# Patient Record
Sex: Female | Born: 1970
Health system: Southern US, Community
[De-identification: ages and names within clinical notes are randomized; demographics above are authoritative.]

## PROBLEM LIST (undated history)

## (undated) ENCOUNTER — Emergency Department (HOSPITAL_COMMUNITY): Admission: EM | Discharge status: Absent without leave | Payer: Medicare Other

## (undated) ENCOUNTER — Emergency Department (HOSPITAL_COMMUNITY): Payer: Medicare Other | Source: Home / Self Care

## (undated) DIAGNOSIS — G500 Trigeminal neuralgia: Secondary | ICD-10-CM

## (undated) DIAGNOSIS — F410 Panic disorder [episodic paroxysmal anxiety] without agoraphobia: Secondary | ICD-10-CM

## (undated) DIAGNOSIS — R569 Unspecified convulsions: Secondary | ICD-10-CM

## (undated) DIAGNOSIS — I639 Cerebral infarction, unspecified: Secondary | ICD-10-CM

## (undated) DIAGNOSIS — I10 Essential (primary) hypertension: Secondary | ICD-10-CM

## (undated) DIAGNOSIS — F4310 Post-traumatic stress disorder, unspecified: Secondary | ICD-10-CM

## (undated) DIAGNOSIS — F419 Anxiety disorder, unspecified: Secondary | ICD-10-CM

## (undated) DIAGNOSIS — E7800 Pure hypercholesterolemia, unspecified: Secondary | ICD-10-CM

## (undated) HISTORY — PX: ABDOMINOPLASTY: SUR9

## (undated) HISTORY — PX: BRAIN SURGERY: SHX531

## (undated) HISTORY — PX: CERVICAL ABLATION: SHX5771

## (undated) HISTORY — PX: OTHER SURGICAL HISTORY: SHX169

## (undated) HISTORY — PX: TONSILLECTOMY: SUR1361

## (undated) HISTORY — PX: ABDOMINAL HYSTERECTOMY: SHX81

## (undated) HISTORY — PX: APPENDECTOMY: SHX54

---

## 2001-05-29 ENCOUNTER — Ambulatory Visit (HOSPITAL_COMMUNITY): Admission: RE | Admit: 2001-05-29 | Discharge: 2001-05-29 | Payer: Self-pay | Admitting: Family Medicine

## 2001-05-29 ENCOUNTER — Encounter: Payer: Self-pay | Admitting: Family Medicine

## 2001-05-31 ENCOUNTER — Encounter: Payer: Self-pay | Admitting: Family Medicine

## 2001-05-31 ENCOUNTER — Ambulatory Visit (HOSPITAL_COMMUNITY): Admission: RE | Admit: 2001-05-31 | Discharge: 2001-05-31 | Payer: Self-pay | Admitting: Family Medicine

## 2001-06-05 ENCOUNTER — Ambulatory Visit (HOSPITAL_COMMUNITY): Admission: RE | Admit: 2001-06-05 | Discharge: 2001-06-05 | Payer: Self-pay | Admitting: Family Medicine

## 2001-06-05 ENCOUNTER — Encounter: Payer: Self-pay | Admitting: Family Medicine

## 2001-06-16 ENCOUNTER — Ambulatory Visit (HOSPITAL_COMMUNITY): Admission: RE | Admit: 2001-06-16 | Discharge: 2001-06-16 | Payer: Self-pay | Admitting: General Surgery

## 2001-06-16 ENCOUNTER — Encounter: Payer: Self-pay | Admitting: General Surgery

## 2001-07-07 ENCOUNTER — Ambulatory Visit (HOSPITAL_COMMUNITY): Admission: RE | Admit: 2001-07-07 | Discharge: 2001-07-07 | Payer: Self-pay | Admitting: Internal Medicine

## 2001-07-13 ENCOUNTER — Inpatient Hospital Stay (HOSPITAL_COMMUNITY): Admission: RE | Admit: 2001-07-13 | Discharge: 2001-07-14 | Payer: Self-pay | Admitting: General Surgery

## 2002-01-12 ENCOUNTER — Encounter: Payer: Self-pay | Admitting: Family Medicine

## 2002-01-12 ENCOUNTER — Ambulatory Visit (HOSPITAL_COMMUNITY): Admission: RE | Admit: 2002-01-12 | Discharge: 2002-01-12 | Payer: Self-pay | Admitting: Family Medicine

## 2002-03-13 ENCOUNTER — Ambulatory Visit (HOSPITAL_COMMUNITY): Admission: RE | Admit: 2002-03-13 | Discharge: 2002-03-13 | Payer: Self-pay | Admitting: Internal Medicine

## 2002-03-14 ENCOUNTER — Encounter (INDEPENDENT_AMBULATORY_CARE_PROVIDER_SITE_OTHER): Payer: Self-pay | Admitting: Internal Medicine

## 2002-03-14 ENCOUNTER — Ambulatory Visit (HOSPITAL_COMMUNITY): Admission: RE | Admit: 2002-03-14 | Discharge: 2002-03-14 | Payer: Self-pay | Admitting: Internal Medicine

## 2003-06-21 ENCOUNTER — Encounter: Payer: Self-pay | Admitting: Urology

## 2003-06-21 ENCOUNTER — Ambulatory Visit (HOSPITAL_COMMUNITY): Admission: RE | Admit: 2003-06-21 | Discharge: 2003-06-21 | Payer: Self-pay | Admitting: Urology

## 2003-11-12 ENCOUNTER — Ambulatory Visit (HOSPITAL_COMMUNITY): Admission: RE | Admit: 2003-11-12 | Discharge: 2003-11-12 | Payer: Self-pay | Admitting: Obstetrics & Gynecology

## 2010-01-12 ENCOUNTER — Ambulatory Visit: Payer: Self-pay | Admitting: Cardiology

## 2010-05-10 ENCOUNTER — Emergency Department (HOSPITAL_COMMUNITY): Admission: EM | Admit: 2010-05-10 | Discharge: 2010-05-11 | Payer: Self-pay | Admitting: Emergency Medicine

## 2010-11-22 ENCOUNTER — Encounter: Payer: Self-pay | Admitting: Family Medicine

## 2011-03-19 NOTE — Op Note (Signed)
[facility]  Patient:    Mariah Ibarra, Mariah Ibarra Visit Number: [id number] MRN: [id number]          Service Type: OUT Location: RAD Attending Physician:  Malissa Hippo Dictated by:   Lionel December, M.D. Proc. Date: 03/13/02 Admit Date:  03/14/2002   CC:         Patrica Duel, M.D.   Operative Report  PROCEDURE:  Esophagogastroduodenoscopy followed by flexible sigmoidoscopy.  GASTROENTEROLOGIST:  Lionel December, M.D.  INDICATION:  [name] is a 40 year old Caucasian female with abdominal pain, nausea, intermittent vomiting who also has constipation and had four episodes of rectal bleeding recently with her bowel movements.  On one occasion, she passed a large amount of fresh blood.  She had total colonoscopy and ileoscopy in September 2002 prior to he laparoscopy with adhesiolysis.  I, therefore, felt that all she needed was a flexible sigmoidoscopy to evaluate her hematochezia.  She had had multiple other studies.  She has had ultrasound, hepatobiliary scan, and there is no evidence GB disease or dysfunction.  She is undergoing diagnostic evaluation.  The patient was reviewed with the patient, and informed consent was obtained.  PREOPERATIVE MEDICATIONS:  Cetacaine spray for pharyngeal topical anesthesia, Demerol 50 mg IV, Versed 8 mg IV in divided dose.  INSTRUMENT:  Olympus video system.  FINDINGS:  Procedure performed in endoscopy suite.  The patients vital signs and O2 saturations were monitored during the procedure and remained stable.  PROCEDURE #1:  ESOPHAGOGASTRODUODENOSCOPY:  The patient was placed in the left lateral position and endoscope was passed into the oropharynx without difficulty into esophagus.  Esophagus:  Mucosa of the esophagus was normal throughout.  Squamocolumnar junction was otherwise normal.  Stomach:  It was empty and distended very well with insufflation.  Folds in the proximal stomach were normal.   Examination of mucosa at gastric body, antrum, and pyloric channel as well as angularis and fundus were normal.  Duodenum:  Duodenum as well as bulb and second part of duodenum were also normal.  The endoscope was withdrawn, and the patient was prepared for procedure #2.  #2 - FLEXIBLE SIGMOIDOSCOPY:  Rectal examination performed.  No abnormality noted on external or digital exam.  The scope was placed in the rectum and advanced to sigmoid colon.  The scope was passed through about 50 cm.  Preparation in the area examined was excellent.  Mucosa of sigmoid colon and rectum were normal.  The scope was retroflexed in the rectum to examine the anorectal junction.  Small hemorrhoids were noted below the dentate line.  The endoscope was straightened and withdrawn.  The patient tolerated the procedure well.  FINAL DIAGNOSES: 1. Normal esophagogastroduodenoscopy. 2. Normal flexible sigmoidoscopy except for small external hemorrhoids felt    to be cause of her hematochezia.  I expect we are dealing with constipation predominant irritable bowel syndrome.  RECOMMENDATIONS:  Proceed with small-bowel follow-through and start her on Zelnorm 6 mg p.o. b.i.d.  Will also start high-fiber diet and Citrucel 1 tablespoonful daily.  She will return for evaluation in four weeks from now.Dictated by:   Lionel December, M.D. Attending Physician:  Malissa Hippo DD:  03/13/02 TD:  03/14/02 Job: [id number]

## 2011-03-19 NOTE — Op Note (Signed)
NAME:  Mariah Ibarra, Mariah Ibarra              ACCOUNT NO.:  [id number]   MEDICAL RECORD NO.:  [id number]                   PATIENT TYPE:  AMB   LOCATION:  DAY                                  FACILITY:  [facility]   PHYSICIAN:  Lazaro Arms, M.D.                DATE OF BIRTH:  1971/07/14   DATE OF PROCEDURE:  11/12/2003  DATE OF DISCHARGE:  11/12/2003                                 OPERATIVE REPORT   PREOPERATIVE DIAGNOSES:  1. Dyspareunia.  2. Interstitial cystitis.  3. Vulvar vestibulitis.   POSTOPERATIVE DIAGNOSES:  1. Dyspareunia.  2. Interstitial cystitis.  3. Vulvar vestibulitis.   PROCEDURE:  Vulvar vestibulectomy.   SURGEON:  Lazaro Arms, M.D.   ANESTHESIA:  General endotracheal.   FINDINGS:  The patient is known to have interstitial cystitis where she is  undergoing treatment for the last several months in the office for that. She  also has vestibulitis which is related to the interstitial cystitis. She has  not responded to local therapy with local anesthetic jellies or the  antiinflammatory medications we have been using in the office.  As a result  of the positive findings, she is admitted for a vulvar vestibulectomy.   DESCRIPTION OF PROCEDURE:  The patient was taken to the operating room,  placed in supine position where she underwent general endotracheal  anesthesia.  She was then placed in the dorsal lithotomy position, prepped  and draped in the usual sterile fashion. A pudendal block is performed  bilaterally 10 mL bilaterally of 0.5% bupivacaine with 1:200,000  epinephrine.  The vulvar vestibule is identified, silk retaining sutures are  placed to hold the vulva back.  A 15 blade is used and just outside the  hymenal ring, a horseshoe shaped tissue is removed probably a 1/2 cm in  width down to about a centimeter in depth corresponding to the area of the  positive Q-tip test in the office.  Hemostasis is achieved with suturing and  cautery.  3-0 Vicryl  is used. There is good hemostasis.  I had to take down  the sutures a couple times on the right to ensure hemostasis but eventually  there was.  I held pressure for a little while and it had a normal amount of  spotting postoperatively. She was awakened from anesthesia, taken to the  recovery room in good stable condition.  All counts were correct.     ___________________________________________                                            Lazaro Arms, M.D.   LHE/MEDQ  D:  12/05/2003  T:  12/05/2003  Job:  [id number]

## 2011-07-25 ENCOUNTER — Emergency Department (HOSPITAL_COMMUNITY)
Admission: EM | Admit: 2011-07-25 | Discharge: 2011-07-25 | Disposition: A | Discharge status: Home / Self Care | Payer: BC Managed Care – PPO | Attending: Emergency Medicine | Admitting: Emergency Medicine

## 2011-07-25 ENCOUNTER — Other Ambulatory Visit: Payer: Self-pay

## 2011-07-25 ENCOUNTER — Emergency Department (HOSPITAL_COMMUNITY): Payer: BC Managed Care – PPO

## 2011-07-25 DIAGNOSIS — R29898 Other symptoms and signs involving the musculoskeletal system: Secondary | ICD-10-CM

## 2011-07-25 DIAGNOSIS — R209 Unspecified disturbances of skin sensation: Secondary | ICD-10-CM | POA: Insufficient documentation

## 2011-07-25 DIAGNOSIS — I10 Essential (primary) hypertension: Secondary | ICD-10-CM | POA: Insufficient documentation

## 2011-07-25 DIAGNOSIS — M542 Cervicalgia: Secondary | ICD-10-CM | POA: Insufficient documentation

## 2011-07-25 DIAGNOSIS — G500 Trigeminal neuralgia: Secondary | ICD-10-CM | POA: Insufficient documentation

## 2011-07-25 DIAGNOSIS — R51 Headache: Secondary | ICD-10-CM | POA: Insufficient documentation

## 2011-07-25 DIAGNOSIS — Z8673 Personal history of transient ischemic attack (TIA), and cerebral infarction without residual deficits: Secondary | ICD-10-CM | POA: Insufficient documentation

## 2011-07-25 DIAGNOSIS — R197 Diarrhea, unspecified: Secondary | ICD-10-CM

## 2011-07-25 DIAGNOSIS — M549 Dorsalgia, unspecified: Secondary | ICD-10-CM

## 2011-07-25 DIAGNOSIS — R112 Nausea with vomiting, unspecified: Secondary | ICD-10-CM | POA: Insufficient documentation

## 2011-07-25 DIAGNOSIS — M6281 Muscle weakness (generalized): Secondary | ICD-10-CM | POA: Insufficient documentation

## 2011-07-25 HISTORY — DX: Essential (primary) hypertension: I10

## 2011-07-25 HISTORY — DX: Cerebral infarction, unspecified: I63.9

## 2011-07-25 LAB — DIFFERENTIAL
Basophils Relative: 1 % (ref 0–1)
Eosinophils Absolute: 0.2 10*3/uL (ref 0.0–0.7)
Eosinophils Relative: 3 % (ref 0–5)
Lymphocytes Relative: 35 % (ref 12–46)
Lymphs Abs: 2.4 10*3/uL (ref 0.7–4.0)
Monocytes Absolute: 0.6 10*3/uL (ref 0.1–1.0)
Monocytes Relative: 9 % (ref 3–12)
Neutrophils Relative %: 52 % (ref 43–77)

## 2011-07-25 LAB — CBC
HCT: 41.5 % (ref 36.0–46.0)
Hemoglobin: 13.5 g/dL (ref 12.0–15.0)
MCH: 32.8 pg (ref 26.0–34.0)
MCHC: 32.5 g/dL (ref 30.0–36.0)
MCV: 101 fL — ABNORMAL HIGH (ref 78.0–100.0)
Platelets: 351 10*3/uL (ref 150–400)
RBC: 4.11 MIL/uL (ref 3.87–5.11)
RDW: 13.1 % (ref 11.5–15.5)
WBC: 6.8 10*3/uL (ref 4.0–10.5)

## 2011-07-25 LAB — COMPREHENSIVE METABOLIC PANEL
Chloride: 103 mEq/L (ref 96–112)
GFR calc Af Amer: >60 mL/min (ref >60)
Sodium: 137 mEq/L (ref 135–145)
Total Bilirubin: 0.1 mg/dL — ABNORMAL LOW (ref 0.3–1.2)
Total Protein: 7 g/dL (ref 6.0–8.3)

## 2011-07-25 LAB — PROTIME-INR
INR: 0.87 (ref 0.00–1.49)
Prothrombin Time: 12 seconds (ref 11.6–15.2)

## 2011-07-25 MED ORDER — SODIUM CHLORIDE 0.9 % IV SOLN
1000.0000 mL | INTRAVENOUS | Status: AC
  Administered 2011-07-25: 1000 mL via INTRAVENOUS

## 2011-07-25 MED ORDER — HYDROMORPHONE HCL 1 MG/ML IJ SOLN
1.0000 mg | Freq: Once | INTRAMUSCULAR | Status: AC
  Administered 2011-07-25: 1 mg via INTRAVENOUS
  Filled 2011-07-25: qty 1

## 2011-07-25 MED ORDER — METOCLOPRAMIDE HCL 5 MG/ML IJ SOLN
10.0000 mg | Freq: Once | INTRAMUSCULAR | Status: AC
  Administered 2011-07-25: 10 mg via INTRAVENOUS
  Filled 2011-07-25: qty 2

## 2011-07-25 MED ORDER — SODIUM CHLORIDE 0.9 % IV BOLUS (SEPSIS)
1000.0000 mL | Freq: Once | INTRAVENOUS | Status: AC
  Administered 2011-07-25 (×2): 1000 mL via INTRAVENOUS

## 2011-07-25 NOTE — ED Notes (Signed)
Pt states she has not been feeling well since Friday, feels weak all over, pain in right leg and hip, lower back , vomiting, states face feels flushed.   Pt reports she had an ischemic stroke February 2011.

## 2011-07-25 NOTE — ED Notes (Signed)
Pt states feeling better at this time.

## 2011-07-25 NOTE — ED Provider Notes (Signed)
History     CSN: [id number] Arrival date & time: 07/25/2011  7:58 PM  Chief Complaint  Patient presents with  . Weakness    HPI  (Consider location/radiation/quality/duration/timing/severity/associated sxs/prior treatment) This 40 year old female complains of 3 days of multiple episodes of nonbloody nausea vomiting and diarrhea with generalized weakness. She also has a gradual onset of a diffuse global headache as well as back pain and neck pain posteriorly. The back and neck pain are positional and nonradiating other than pain down her left hip. 2 days ago she noted weakness and numbness to her left leg that did not involve her left arm at all. Yesterday the weakness and numbness were much improved but not back to normal activity she has no numbness still has slight weakness to the left leg. She has not had any weakness or numbness again to her left arm. Her left face is always numb and painful from trigeminal neuralgia without weakness. She has chronic headaches and the left side of her head from trigeminal neuralgia but over the last 3 days has had a gradual onset diffuse headache. There is no sudden headache and noticed sudden strokelike symptoms. She states she gradually noticed the left leg weakness and numbness to couple of days ago as she was getting neck and back pain gradually worsening. She denies any fever confusion or change in speech, vision, swallowing, or understanding. She is no change in bowel or bladder function. She does not use IV drugs. She denies any midline neck pain or back pain. She denies any chest pain cough shortness of breath or abdominal pain. She only notices very mild pain to her left leg when she tries to walk today. It is associated with positional back pain that radiates towards her left hip without trauma. She states she has had a prior stroke that affected her speech but has recovered from that. HPI  Past Medical History  Diagnosis Date  . Stroke   .  Hypertension     Past Surgical History  Procedure Date  . Brain surgery   . Appendectomy     History reviewed. No pertinent family history.  History  Substance Use Topics  . Smoking status: Never Smoker   . Smokeless tobacco: Not on file  . Alcohol Use: Yes    OB History    Grav Para Term Preterm Abortions TAB SAB Ect Mult Living                  Review of Systems  Review of Systems  Constitutional: Negative for fever.       10 Systems reviewed and are negative for acute change except as noted in the HPI.  HENT: Positive for neck pain. Negative for rhinorrhea.   Eyes: Negative for discharge and redness.  Respiratory: Negative for cough and shortness of breath.   Cardiovascular: Negative for chest pain.  Gastrointestinal: Positive for nausea, vomiting and diarrhea. Negative for abdominal pain.  Genitourinary: Negative for dysuria.  Musculoskeletal: Positive for back pain.  Skin: Negative for rash.  Neurological: Positive for weakness, numbness and headaches. Negative for dizziness, syncope and speech difficulty.       She denies vertigo.  Psychiatric/Behavioral: Negative for suicidal ideas, hallucinations and confusion.    Allergies  Review of patient's allergies indicates no known allergies.  Home Medications   Current Outpatient Rx  Name Route Sig Dispense Refill  . ALPRAZOLAM 0.5 MG PO TABS Oral Take 0.5 mg by mouth 4 (four) times daily.      Marland Kitchen  ASPIRIN EC 81 MG PO TBEC Oral Take 81 mg by mouth daily.      . ATORVASTATIN CALCIUM 10 MG PO TABS Oral Take 10 mg by mouth daily.      Marland Kitchen BACLOFEN 10 MG PO TABS Oral Take 10 mg by mouth 3 (three) times daily.      Marland Kitchen DIAZEPAM 5 MG PO TABS Oral Take 5 mg by mouth at bedtime.      Marland Kitchen GABAPENTIN 800 MG PO TABS Oral Take 800 mg by mouth 3 (three) times daily.      Marland Kitchen HYDROCODONE-ACETAMINOPHEN 10-325 MG PO TABS Oral Take 1 tablet by mouth 3 (three) times daily.      Marland Kitchen OLMESARTAN MEDOXOMIL-HCTZ 40-12.5 MG PO TABS Oral Take 1  tablet by mouth daily.      . TRAMADOL HCL 50 MG PO TABS Oral Take 100 mg by mouth 4 (four) times daily.      Marland Kitchen ZOLPIDEM TARTRATE 5 MG PO TABS Oral Take 5 mg by mouth at bedtime.        Physical Exam    BP 108/77  Pulse 88  Temp(Src) 98 F (36.7 C) (Oral)  Resp 18  Ht 5\' 2"  (1.575 m)  Wt 140 lb (63.504 kg)  BMI 25.61 kg/m2  SpO2 100%  Physical Exam  Nursing note and vitals reviewed. Constitutional:       Awake, alert, nontoxic appearance with baseline speech for patient.  HENT:  Head: Atraumatic.  Mouth/Throat: No oropharyngeal exudate.  Eyes: EOM are normal. Pupils are equal, round, and reactive to light. Right eye exhibits no discharge. Left eye exhibits no discharge.  Neck: Normal range of motion. Neck supple.       She does not have any midline posterior neck tenderness she does have mild paracervical soft tissue tenderness bilaterally.  Cardiovascular: Normal rate and regular rhythm.   No murmur heard. Pulmonary/Chest: Effort normal and breath sounds normal. No stridor. No respiratory distress. She has no wheezes. She has no rales. She exhibits no tenderness.  Abdominal: Soft. Bowel sounds are normal. She exhibits no mass. There is no tenderness. There is no rebound.  Musculoskeletal: Normal range of motion. She exhibits no edema and no tenderness.       Baseline ROM, moves extremities with no obvious new focal weakness.  Her back has no midline tenderness she does have bilateral parathoracic and paralumbar soft tissue tenderness.  Lymphadenopathy:    She has no cervical adenopathy.  Neurological:       Awake, alert, cooperative and aware of situation; motor strength bilaterally; sensation normal to light touch bilaterally; peripheral visual fields full to confrontation; no facial asymmetry; tongue midline; major cranial nerves appear intact; no pronator drift, normal finger to nose bilaterally  Skin: No rash noted.  Psychiatric: She has a normal mood and affect.    ED  Course  Procedures (including critical care time)  The patient feels dramatically improved with ED treatment and I discussed the case with her neurologist Dr. Gerilyn Pilgrim.  Because the patient has had isolated left leg mild weakness that was gradual onset gradually worsened and now is much improved but not quite back to baseline yet with back pain and pain radiating to her left hip with transient numbness of the last couple of days it is now resolved I suspect her left leg weakness and transient numbness with gradual onset weakness in gradually improving weakness is more likely related to a lumbar radiculopathy and much less likely related to a  recurrent stroke. I doubt a subarachnoid hemorrhage, acute stroke, serious bacterial illness such as epidural abscess or bacterial meningitis, or other emergency medical condition requiring further evaluation tonight in the emergency department. I believe the patient is stable for close outpatient follow up with her neurologist and the patient understands and agrees with this assessment and plan as well.  ECG: Normal sinus rhythm with ventricular rate 97 beats per minute with normal axis normal intervals, no acute ischemic changes noted, there is no comparison ECG available at this time, impression normal ECG. Labs Reviewed  CBC - Abnormal; Notable for the following:    MCV 101.0 (*)    All other components within normal limits  COMPREHENSIVE METABOLIC PANEL - Abnormal; Notable for the following:    Alkaline Phosphatase 21 (*)    Total Bilirubin 0.1 (*)    All other components within normal limits  DIFFERENTIAL  PROTIME-INR  LAB REPORT - SCANNED   No results found.    MDM         Hurman Horn, MD 08/02/11 1120

## 2011-07-25 NOTE — ED Notes (Signed)
Pt reports onset of weakness that started Friday. Pt complains of headache at current. Pt does show some left sided facial weakness, pt states this did occur w/ previous stroke. Pt has good movement of all ext. Good strength present also.

## 2011-07-27 ENCOUNTER — Other Ambulatory Visit: Payer: Self-pay | Admitting: Neurology

## 2011-07-27 DIAGNOSIS — R531 Weakness: Secondary | ICD-10-CM

## 2011-07-27 DIAGNOSIS — R200 Anesthesia of skin: Secondary | ICD-10-CM

## 2011-07-29 ENCOUNTER — Ambulatory Visit (HOSPITAL_COMMUNITY)
Admission: RE | Admit: 2011-07-29 | Discharge: 2011-07-29 | Disposition: A | Discharge status: Home / Self Care | Payer: BC Managed Care – PPO | Source: Ambulatory Visit | Attending: Neurology | Admitting: Neurology

## 2011-07-29 ENCOUNTER — Ambulatory Visit (HOSPITAL_COMMUNITY): Payer: BC Managed Care – PPO

## 2011-07-29 ENCOUNTER — Other Ambulatory Visit (HOSPITAL_COMMUNITY): Payer: BC Managed Care – PPO

## 2011-07-29 DIAGNOSIS — M6281 Muscle weakness (generalized): Secondary | ICD-10-CM | POA: Insufficient documentation

## 2011-07-29 DIAGNOSIS — R200 Anesthesia of skin: Secondary | ICD-10-CM

## 2011-07-29 DIAGNOSIS — R209 Unspecified disturbances of skin sensation: Secondary | ICD-10-CM | POA: Insufficient documentation

## 2011-07-29 DIAGNOSIS — R531 Weakness: Secondary | ICD-10-CM

## 2012-06-06 ENCOUNTER — Emergency Department (HOSPITAL_COMMUNITY)
Admission: EM | Admit: 2012-06-06 | Discharge: 2012-06-06 | Disposition: A | Discharge status: Home / Self Care | Payer: BC Managed Care – PPO | Attending: Emergency Medicine | Admitting: Emergency Medicine

## 2012-06-06 ENCOUNTER — Encounter (HOSPITAL_COMMUNITY): Payer: Self-pay

## 2012-06-06 DIAGNOSIS — I10 Essential (primary) hypertension: Secondary | ICD-10-CM | POA: Insufficient documentation

## 2012-06-06 DIAGNOSIS — E7800 Pure hypercholesterolemia, unspecified: Secondary | ICD-10-CM | POA: Insufficient documentation

## 2012-06-06 DIAGNOSIS — Z8673 Personal history of transient ischemic attack (TIA), and cerebral infarction without residual deficits: Secondary | ICD-10-CM | POA: Insufficient documentation

## 2012-06-06 DIAGNOSIS — R51 Headache: Secondary | ICD-10-CM | POA: Insufficient documentation

## 2012-06-06 HISTORY — DX: Anxiety disorder, unspecified: F41.9

## 2012-06-06 HISTORY — DX: Pure hypercholesterolemia, unspecified: E78.00

## 2012-06-06 HISTORY — DX: Trigeminal neuralgia: G50.0

## 2012-06-06 HISTORY — DX: Panic disorder (episodic paroxysmal anxiety): F41.0

## 2012-06-06 LAB — POCT I-STAT, CHEM 8
BUN: 7 mg/dL (ref 6–23)
Calcium, Ion: 1.23 mmol/L (ref 1.12–1.23)
HCT: 40 % (ref 36.0–46.0)
Hemoglobin: 13.6 g/dL (ref 12.0–15.0)
Sodium: 140 mEq/L (ref 135–145)
TCO2: 25 mmol/L (ref 0–100)

## 2012-06-06 MED ORDER — HYDROMORPHONE HCL PF 1 MG/ML IJ SOLN
1.0000 mg | Freq: Once | INTRAMUSCULAR | Status: AC
  Administered 2012-06-06: 1 mg via INTRAVENOUS
  Filled 2012-06-06: qty 1

## 2012-06-06 MED ORDER — SODIUM CHLORIDE 0.9 % IV BOLUS (SEPSIS): 1000.0000 mL | Freq: Once | INTRAVENOUS | Status: DC

## 2012-06-06 MED ORDER — ONDANSETRON HCL 4 MG/2ML IJ SOLN
4.0000 mg | Freq: Once | INTRAMUSCULAR | Status: AC
  Administered 2012-06-06: 4 mg via INTRAVENOUS
  Filled 2012-06-06: qty 2

## 2012-06-06 NOTE — ED Notes (Signed)
Pt reports headache for 1 week, pain has now spread all over her body, nausea and vomiting (last episode Sunday)--was seen in [facility] er for same.   All symptoms have now returned. Was told that potassium was very low and needed to be admitted, pt refused to stay

## 2012-06-06 NOTE — ED Provider Notes (Addendum)
History     CSN: [id number]  Arrival date & time 06/06/12  1352   First MD Initiated Contact with Patient 06/06/12 1412      Chief Complaint  Patient presents with  . Headache    (Consider location/radiation/quality/duration/timing/severity/associated sxs/prior treatment) HPI Pt with history of chronic daily headaches attributed to trigeminal neuralgia and managed by Dr. Gerilyn Pilgrim reports over the weekend she was having severe headache nausea and vomiting. She went to [facility] ED where reportedly had blood work showing hypokalemia but did not stay for treatment. She has not vomited in the last two days, but headache has been persistent. Diffuse throbbing headache, not associated with photophobia and similar to numerous prior episodes.   Past Medical History  Diagnosis Date  . Stroke   . Hypertension   . Anxiety   . Panic attacks   . Trigeminal neuralgia   . Hypercholesteremia     Past Surgical History  Procedure Date  . Brain surgery   . Appendectomy   . Tonsillectomy     No family history on file.  History  Substance Use Topics  . Smoking status: Never Smoker   . Smokeless tobacco: Not on file  . Alcohol Use: Yes     occ    OB History    Grav Para Term Preterm Abortions TAB SAB Ect Mult Living                  Review of Systems All other systems reviewed and are negative except as noted in HPI.   Allergies  Review of patient's allergies indicates no known allergies.  Home Medications   Current Outpatient Rx  Name Route Sig Dispense Refill  . ALPRAZOLAM 0.5 MG PO TABS Oral Take 0.5 mg by mouth 4 (four) times daily.      . ASPIRIN EC 81 MG PO TBEC Oral Take 81 mg by mouth daily.      . ATORVASTATIN CALCIUM 10 MG PO TABS Oral Take 10 mg by mouth daily.      Marland Kitchen BACLOFEN 10 MG PO TABS Oral Take 10 mg by mouth 3 (three) times daily.      Marland Kitchen DIAZEPAM 5 MG PO TABS Oral Take 5 mg by mouth at bedtime.      Marland Kitchen GABAPENTIN 800 MG PO TABS Oral Take 800 mg by mouth 3  (three) times daily.      Marland Kitchen HYDROCODONE-ACETAMINOPHEN 10-325 MG PO TABS Oral Take 1 tablet by mouth 3 (three) times daily.      Marland Kitchen OLMESARTAN MEDOXOMIL-HCTZ 40-12.5 MG PO TABS Oral Take 1 tablet by mouth daily.      . TRAMADOL HCL 50 MG PO TABS Oral Take 100 mg by mouth 4 (four) times daily.      Marland Kitchen ZOLPIDEM TARTRATE 5 MG PO TABS Oral Take 5 mg by mouth at bedtime.        BP 170/100  Pulse 95  Temp 98.7 F (37.1 C) (Oral)  Resp 17  Ht 5\' 2"  (1.575 m)  Wt 160 lb (72.576 kg)  BMI 29.26 kg/m2  SpO2 100%  LMP 05/10/2012  Physical Exam  Nursing note and vitals reviewed. Constitutional: She is oriented to person, place, and time. She appears well-developed and well-nourished.  HENT:  Head: Normocephalic and atraumatic.  Eyes: EOM are normal. Pupils are equal, round, and reactive to light.  Neck: Normal range of motion. Neck supple.  Cardiovascular: Normal rate, normal heart sounds and intact distal pulses.   Pulmonary/Chest: Effort normal  and breath sounds normal.  Abdominal: Bowel sounds are normal. She exhibits no distension. There is no tenderness.  Musculoskeletal: Normal range of motion. She exhibits no edema and no tenderness.  Neurological: She is alert and oriented to person, place, and time. She has normal strength. No cranial nerve deficit or sensory deficit.  Skin: Skin is warm and dry. No rash noted.  Psychiatric: She has a normal mood and affect.    ED Course  Procedures (including critical care time)  Labs Reviewed  POCT I-STAT, CHEM 8 - Abnormal; Notable for the following:    Glucose, Bld 103 (*)     All other components within normal limits   No results found.   No diagnosis found.    MDM  Will give IVF, pain and nausea meds an check I-Stat. Pt states Dr. Ronal Fear office told her to come to the ED today.    2:58 PM Pain resolved. Labs normal. Advised to followup with Neuro as needed.     Charles B. Bernette Mayers, MD 06/06/12 1610

## 2012-07-29 ENCOUNTER — Emergency Department (HOSPITAL_COMMUNITY): Payer: BC Managed Care – PPO

## 2012-07-29 ENCOUNTER — Emergency Department (HOSPITAL_COMMUNITY)
Admission: EM | Admit: 2012-07-29 | Discharge: 2012-07-29 | Disposition: A | Discharge status: Home / Self Care | Payer: BC Managed Care – PPO | Attending: Emergency Medicine | Admitting: Emergency Medicine

## 2012-07-29 ENCOUNTER — Encounter (HOSPITAL_COMMUNITY): Payer: Self-pay

## 2012-07-29 DIAGNOSIS — Z9089 Acquired absence of other organs: Secondary | ICD-10-CM | POA: Insufficient documentation

## 2012-07-29 DIAGNOSIS — G8929 Other chronic pain: Secondary | ICD-10-CM | POA: Insufficient documentation

## 2012-07-29 DIAGNOSIS — Z79899 Other long term (current) drug therapy: Secondary | ICD-10-CM | POA: Insufficient documentation

## 2012-07-29 DIAGNOSIS — Z8673 Personal history of transient ischemic attack (TIA), and cerebral infarction without residual deficits: Secondary | ICD-10-CM | POA: Insufficient documentation

## 2012-07-29 DIAGNOSIS — E7800 Pure hypercholesterolemia, unspecified: Secondary | ICD-10-CM | POA: Insufficient documentation

## 2012-07-29 DIAGNOSIS — R51 Headache: Secondary | ICD-10-CM | POA: Insufficient documentation

## 2012-07-29 DIAGNOSIS — I10 Essential (primary) hypertension: Secondary | ICD-10-CM | POA: Insufficient documentation

## 2012-07-29 DIAGNOSIS — Z7982 Long term (current) use of aspirin: Secondary | ICD-10-CM | POA: Insufficient documentation

## 2012-07-29 LAB — CBC WITH DIFFERENTIAL/PLATELET
HCT: 39.9 % (ref 36.0–46.0)
Hemoglobin: 13.9 g/dL (ref 12.0–15.0)
Lymphocytes Relative: 17 % (ref 12–46)
MCHC: 34.8 g/dL (ref 30.0–36.0)
MCV: 98.5 fL (ref 78.0–100.0)
Monocytes Absolute: 0.8 10*3/uL (ref 0.1–1.0)
Monocytes Relative: 6 % (ref 3–12)
Neutro Abs: 9.2 10*3/uL — ABNORMAL HIGH (ref 1.7–7.7)
WBC: 12.1 10*3/uL — ABNORMAL HIGH (ref 4.0–10.5)

## 2012-07-29 LAB — COMPREHENSIVE METABOLIC PANEL
ALT: 8 U/L (ref 0–35)
Albumin: 4.1 g/dL (ref 3.5–5.2)
Calcium: 9.9 mg/dL (ref 8.4–10.5)
GFR calc Af Amer: 45 mL/min — ABNORMAL LOW (ref >90)
Glucose, Bld: 112 mg/dL — ABNORMAL HIGH (ref 70–99)
Sodium: 136 mEq/L (ref 135–145)
Total Protein: 7.6 g/dL (ref 6.0–8.3)

## 2012-07-29 MED ORDER — METHYLPREDNISOLONE SODIUM SUCC 125 MG IJ SOLR
125.0000 mg | Freq: Once | INTRAMUSCULAR | Status: AC
  Administered 2012-07-29: 125 mg via INTRAVENOUS
  Filled 2012-07-29: qty 2

## 2012-07-29 MED ORDER — SODIUM CHLORIDE 0.9 % IV SOLN
1000.0000 mL | Freq: Once | INTRAVENOUS | Status: AC
  Administered 2012-07-29: 1000 mL via INTRAVENOUS

## 2012-07-29 MED ORDER — ONDANSETRON HCL 4 MG/2ML IJ SOLN
4.0000 mg | Freq: Once | INTRAMUSCULAR | Status: AC
  Administered 2012-07-29: 4 mg via INTRAVENOUS
  Filled 2012-07-29: qty 2

## 2012-07-29 MED ORDER — METOCLOPRAMIDE HCL 5 MG/ML IJ SOLN
10.0000 mg | Freq: Once | INTRAMUSCULAR | Status: AC
  Administered 2012-07-29: 10 mg via INTRAVENOUS
  Filled 2012-07-29: qty 2

## 2012-07-29 MED ORDER — DIPHENHYDRAMINE HCL 50 MG/ML IJ SOLN
25.0000 mg | Freq: Once | INTRAMUSCULAR | Status: AC
  Administered 2012-07-29: 25 mg via INTRAVENOUS
  Filled 2012-07-29: qty 1

## 2012-07-29 MED ORDER — PROCHLORPERAZINE EDISYLATE 5 MG/ML IJ SOLN: 10.0000 mg | Freq: Once | INTRAMUSCULAR | Status: DC

## 2012-07-29 MED ORDER — SODIUM CHLORIDE 0.9 % IV SOLN
1000.0000 mL | INTRAVENOUS | Status: DC
  Administered 2012-07-29: 1000 mL via INTRAVENOUS

## 2012-07-29 NOTE — ED Notes (Signed)
In to discharge pt. Husband states pt is still having pain. EDP not giving any rx. Husband aware

## 2012-07-29 NOTE — ED Provider Notes (Addendum)
History     CSN: [id number]  Arrival date & time 07/29/12  1357   First MD Initiated Contact with Patient 07/29/12 1431      Chief Complaint  Patient presents with  . Headache    (Consider location/radiation/quality/duration/timing/severity/associated sxs/prior treatment) HPI  Patient with headache that began yesterday.  Home meds without relief.  Like prior headaches but more severe.  Nausea but no vomiting, no visual  Changes and no focal neurologic deficits.  Pain is sever, constant, nonradiating and like priors.   Past Medical History  Diagnosis Date  . Stroke   . Hypertension   . Anxiety   . Panic attacks   . Trigeminal neuralgia   . Hypercholesteremia     Past Surgical History  Procedure Date  . Brain surgery   . Appendectomy   . Tonsillectomy     No family history on file.  History  Substance Use Topics  . Smoking status: Never Smoker   . Smokeless tobacco: Not on file  . Alcohol Use: Yes     occ    OB History    Grav Para Term Preterm Abortions TAB SAB Ect Mult Living                  Review of Systems  Constitutional: Negative for fever, chills, activity change, appetite change and unexpected weight change.  HENT: Negative for sore throat, rhinorrhea, neck pain, neck stiffness and sinus pressure.   Eyes: Negative for visual disturbance.  Respiratory: Negative for cough and shortness of breath.   Cardiovascular: Negative for chest pain and leg swelling.  Gastrointestinal: Negative for vomiting, abdominal pain, diarrhea and blood in stool.  Genitourinary: Negative for dysuria, urgency, frequency, vaginal discharge and difficulty urinating.  Musculoskeletal: Negative for myalgias, arthralgias and gait problem.  Skin: Negative for color change and rash.  Neurological: Negative for weakness, light-headedness and headaches.  Hematological: Does not bruise/bleed easily.  Psychiatric/Behavioral: Negative for dysphoric mood.    Allergies  Review of  patient's allergies indicates no known allergies.  Home Medications   Current Outpatient Rx  Name Route Sig Dispense Refill  . ALPRAZOLAM 1 MG PO TABS Oral Take 1 mg by mouth every 6 (six) hours as needed. Nerves/anxiety    . ASPIRIN EC 81 MG PO TBEC Oral Take 81 mg by mouth daily.      Marland Kitchen VITAMIN B-12 PO Oral Take 1 tablet by mouth daily.    Marland Kitchen GABAPENTIN ENACARBIL ER 600 MG PO TB24 Oral Take 600 mg by mouth every 12 (twelve) hours.    . IBUPROFEN 800 MG PO TABS Oral Take 800 mg by mouth 3 (three) times daily.     Marland Kitchen LISINOPRIL-HYDROCHLOROTHIAZIDE 20-25 MG PO TABS Oral Take 1 tablet by mouth daily.    . OXYCODONE HCL ER 15 MG PO TB12 Oral Take 15 mg by mouth every 12 (twelve) hours.    Marland Kitchen TAPENTADOL HCL 75 MG PO TABS Oral Take 75 mg by mouth daily.    Jeananne Rama SULFATE 0.05-0.25 % OP SOLN Both Eyes Place 2 drops into both eyes daily as needed. Dry Eyes    . VITAMIN D (CHOLECALCIFEROL) PO Oral Take 1 tablet by mouth daily.    Marland Kitchen VITAMIN E PO Oral Take 1 tablet by mouth daily.    Marland Kitchen ZOLPIDEM TARTRATE 10 MG PO TABS Oral Take 10 mg by mouth at bedtime as needed. Sleep    . BACLOFEN 10 MG PO TABS Oral Take 10 mg by mouth  3 (three) times daily.      Marland Kitchen OXCARBAZEPINE 300 MG PO TABS Oral Take 300 mg by mouth 3 (three) times daily.    . TRAMADOL HCL 50 MG PO TABS Oral Take 100 mg by mouth 4 (four) times daily as needed. Sleep      BP 102/63  Pulse 80  Temp 98.1 F (36.7 C) (Oral)  Resp 16  Ht 5\' 2"  (1.575 m)  Wt 165 lb (74.844 kg)  BMI 30.18 kg/m2  SpO2 100%  LMP 07/27/2012  Physical Exam  Nursing note and vitals reviewed. Constitutional: She appears well-developed and well-nourished.  HENT:  Head: Normocephalic and atraumatic.  Eyes: Conjunctivae normal and EOM are normal. Pupils are equal, round, and reactive to light.  Neck: Normal range of motion. Neck supple.  Cardiovascular: Normal rate, regular rhythm, normal heart sounds and intact distal pulses.   Pulmonary/Chest:  Effort normal and breath sounds normal.  Abdominal: Soft. Bowel sounds are normal.  Musculoskeletal: Normal range of motion.  Neurological: She is alert.  Skin: Skin is warm and dry.  Psychiatric: She has a normal mood and affect. Judgment and thought content normal. Her speech is delayed. Cognition and memory are normal.    ED Course  Procedures (including critical care time)  Labs Reviewed  COMPREHENSIVE METABOLIC PANEL - Abnormal; Notable for the following:    Glucose, Bld 112 (*)     Creatinine, Ser 1.61 (*)     Alkaline Phosphatase 26 (*)     Total Bilirubin 0.1 (*)     GFR calc non Af Amer 39 (*)     GFR calc Af Amer 45 (*)     All other components within normal limits  CBC WITH DIFFERENTIAL - Abnormal; Notable for the following:    WBC 12.1 (*)     MCH 34.3 (*)     Neutro Abs 9.2 (*)     All other components within normal limits  TROPONIN I   Ct Head Wo Contrast  07/29/2012  *RADIOLOGY REPORT*  Clinical Data: Headache and slurred speech.  CT HEAD WITHOUT CONTRAST  Technique:  Contiguous axial images were obtained from the base of the skull through the vertex without contrast.  Comparison: Head CT scan 07/25/2011 and brain MRI 07/29/2011.  Findings: Left occipital craniectomy defect with plate and screws in place is again seen.  The brain appears normal without evidence of infarction, hemorrhage, mass lesion, mass effect, midline shift or abnormal extra-axial fluid collection.  No hydrocephalus or pneumocephalus.  IMPRESSION: No acute finding.  Postoperative change left occipital bone noted as on prior studies.   Original Report Authenticated By: Bernadene Bell. Maricela Curet, M.D.      No diagnosis found.    MDM  Patient with history of chronic headaches and states she has had stroke but no definitive diagnostics found and patient states she was not admitted for it.  No acute chagnes seen on head ct and no acute ct changes seen.  Patient without evidence of bleed for source of  headache and feels improved after medications here.  Advised to continue home meds for same and follow up with neurologist.         Hilario Quarry, MD 07/29/12 1655  Hilario Quarry, MD 07/29/12 [phone]

## 2012-07-29 NOTE — ED Notes (Signed)
Complain of severe headache that started yesterday

## 2012-07-29 NOTE — ED Notes (Signed)
Pt has non prod cough. Husband answering all question for pt. Pt alert and oriented

## 2012-07-29 NOTE — ED Notes (Signed)
Patient c/o of severe headache that has been going on for about two days not getting progressively worse today. Patient also c/o chest pain and nausea. Patient states that she is having "cold sweats".   Her husband stated that her speech was slurred and she was having trouble organizing her thoughts.   Pt. Has a history of trigeminal neuralgia and seizures. EKG ordered and patient is on monitor.

## 2012-08-24 ENCOUNTER — Emergency Department (HOSPITAL_COMMUNITY): Payer: BC Managed Care – PPO

## 2012-08-24 ENCOUNTER — Encounter (HOSPITAL_COMMUNITY): Payer: Self-pay | Admitting: *Deleted

## 2012-08-24 ENCOUNTER — Inpatient Hospital Stay (HOSPITAL_COMMUNITY)
Admission: EM | Admit: 2012-08-24 | Discharge: 2012-08-26 | DRG: 450 | Disposition: A | Discharge status: Home / Self Care | Payer: BC Managed Care – PPO | Attending: Internal Medicine | Admitting: Internal Medicine

## 2012-08-24 DIAGNOSIS — R001 Bradycardia, unspecified: Secondary | ICD-10-CM

## 2012-08-24 DIAGNOSIS — G8190 Hemiplegia, unspecified affecting unspecified side: Secondary | ICD-10-CM | POA: Diagnosis present

## 2012-08-24 DIAGNOSIS — R279 Unspecified lack of coordination: Secondary | ICD-10-CM | POA: Diagnosis present

## 2012-08-24 DIAGNOSIS — R51 Headache: Secondary | ICD-10-CM | POA: Diagnosis present

## 2012-08-24 DIAGNOSIS — I498 Other specified cardiac arrhythmias: Secondary | ICD-10-CM | POA: Diagnosis present

## 2012-08-24 DIAGNOSIS — Y92009 Unspecified place in unspecified non-institutional (private) residence as the place of occurrence of the external cause: Secondary | ICD-10-CM

## 2012-08-24 DIAGNOSIS — R270 Ataxia, unspecified: Secondary | ICD-10-CM

## 2012-08-24 DIAGNOSIS — R4182 Altered mental status, unspecified: Secondary | ICD-10-CM | POA: Diagnosis present

## 2012-08-24 DIAGNOSIS — Z79899 Other long term (current) drug therapy: Secondary | ICD-10-CM

## 2012-08-24 DIAGNOSIS — T50901A Poisoning by unspecified drugs, medicaments and biological substances, accidental (unintentional), initial encounter: Secondary | ICD-10-CM

## 2012-08-24 DIAGNOSIS — Z8673 Personal history of transient ischemic attack (TIA), and cerebral infarction without residual deficits: Secondary | ICD-10-CM

## 2012-08-24 DIAGNOSIS — E785 Hyperlipidemia, unspecified: Secondary | ICD-10-CM

## 2012-08-24 DIAGNOSIS — T426X1A Poisoning by other antiepileptic and sedative-hypnotic drugs, accidental (unintentional), initial encounter: Principal | ICD-10-CM | POA: Diagnosis present

## 2012-08-24 DIAGNOSIS — R4701 Aphasia: Secondary | ICD-10-CM | POA: Diagnosis present

## 2012-08-24 DIAGNOSIS — Z9089 Acquired absence of other organs: Secondary | ICD-10-CM

## 2012-08-24 DIAGNOSIS — F410 Panic disorder [episodic paroxysmal anxiety] without agoraphobia: Secondary | ICD-10-CM | POA: Diagnosis present

## 2012-08-24 DIAGNOSIS — I10 Essential (primary) hypertension: Secondary | ICD-10-CM | POA: Diagnosis present

## 2012-08-24 DIAGNOSIS — Z8249 Family history of ischemic heart disease and other diseases of the circulatory system: Secondary | ICD-10-CM

## 2012-08-24 DIAGNOSIS — G500 Trigeminal neuralgia: Secondary | ICD-10-CM | POA: Diagnosis present

## 2012-08-24 DIAGNOSIS — Z7982 Long term (current) use of aspirin: Secondary | ICD-10-CM

## 2012-08-24 LAB — COMPREHENSIVE METABOLIC PANEL
AST: 22 U/L (ref 0–37)
Albumin: 3.5 g/dL (ref 3.5–5.2)
Alkaline Phosphatase: 27 U/L — ABNORMAL LOW (ref 39–117)
BUN: 4 mg/dL — ABNORMAL LOW (ref 6–23)
Creatinine, Ser: 0.84 mg/dL (ref 0.50–1.10)
Potassium: 3.5 mEq/L (ref 3.5–5.1)
Total Protein: 7 g/dL (ref 6.0–8.3)

## 2012-08-24 LAB — URINE MICROSCOPIC-ADD ON

## 2012-08-24 LAB — CBC
HCT: 37.1 % (ref 36.0–46.0)
MCHC: 34.2 g/dL (ref 30.0–36.0)
Platelets: 349 10*3/uL (ref 150–400)
RDW: 11.6 % (ref 11.5–15.5)

## 2012-08-24 LAB — RAPID URINE DRUG SCREEN, HOSP PERFORMED
Amphetamines: NOT DETECTED
Opiates: NOT DETECTED
Tetrahydrocannabinol: NOT DETECTED

## 2012-08-24 LAB — URINALYSIS, ROUTINE W REFLEX MICROSCOPIC
Leukocytes, UA: NEGATIVE
Protein, ur: NEGATIVE mg/dL
Urobilinogen, UA: 0.2 mg/dL (ref 0.0–1.0)

## 2012-08-24 LAB — GLUCOSE, CAPILLARY: Glucose-Capillary: 95 mg/dL (ref 70–99)

## 2012-08-24 LAB — SALICYLATE LEVEL: Salicylate Lvl: <2 mg/dL — ABNORMAL LOW (ref 2.8–20.0)

## 2012-08-24 LAB — PREGNANCY, URINE: Preg Test, Ur: NEGATIVE

## 2012-08-24 LAB — ACETAMINOPHEN LEVEL: Acetaminophen (Tylenol), Serum: <15 ug/mL (ref 10–30)

## 2012-08-24 MED ORDER — BISACODYL 10 MG RE SUPP: 10.0000 mg | Freq: Every day | RECTAL | Status: DC | PRN

## 2012-08-24 MED ORDER — ALUM & MAG HYDROXIDE-SIMETH 200-200-20 MG/5ML PO SUSP: 30.0000 mL | Freq: Four times a day (QID) | ORAL | Status: DC | PRN

## 2012-08-24 MED ORDER — LORAZEPAM 2 MG/ML IJ SOLN
0.5000 mg | Freq: Once | INTRAMUSCULAR | Status: AC
  Administered 2012-08-24: 0.5 mg via INTRAVENOUS
  Filled 2012-08-24: qty 1

## 2012-08-24 MED ORDER — HYDROMORPHONE HCL PF 1 MG/ML IJ SOLN
INTRAMUSCULAR | Status: AC
  Administered 2012-08-24: 0.5 mg via INTRAVENOUS
  Filled 2012-08-24: qty 1

## 2012-08-24 MED ORDER — LORAZEPAM 2 MG/ML IJ SOLN
0.5000 mg | INTRAMUSCULAR | Status: DC | PRN
  Administered 2012-08-25 – 2012-08-26 (×3): 0.5 mg via INTRAVENOUS
  Filled 2012-08-24 (×4): qty 1

## 2012-08-24 MED ORDER — ACETAMINOPHEN 650 MG RE SUPP
650.0000 mg | Freq: Once | RECTAL | Status: AC
  Administered 2012-08-24: 650 mg via RECTAL
  Filled 2012-08-24: qty 1

## 2012-08-24 MED ORDER — SODIUM CHLORIDE 0.9 % IV SOLN
INTRAVENOUS | Status: AC
  Administered 2012-08-24 – 2012-08-25 (×3): via INTRAVENOUS

## 2012-08-24 MED ORDER — SODIUM CHLORIDE 0.9 % IJ SOLN
3.0000 mL | Freq: Two times a day (BID) | INTRAMUSCULAR | Status: DC
  Administered 2012-08-24 – 2012-08-25 (×2): 3 mL via INTRAVENOUS
  Filled 2012-08-24: qty 3

## 2012-08-24 MED ORDER — HYDROMORPHONE HCL PF 1 MG/ML IJ SOLN
1.0000 mg | Freq: Once | INTRAMUSCULAR | Status: AC
  Administered 2012-08-24: 1 mg via INTRAVENOUS
  Filled 2012-08-24: qty 1

## 2012-08-24 MED ORDER — HYDROMORPHONE HCL PF 1 MG/ML IJ SOLN
0.5000 mg | Freq: Once | INTRAMUSCULAR | Status: AC
  Administered 2012-08-24: 0.5 mg via INTRAVENOUS

## 2012-08-24 MED ORDER — LORAZEPAM 2 MG/ML IJ SOLN
INTRAMUSCULAR | Status: AC
  Administered 2012-08-24: 2 mg
  Filled 2012-08-24: qty 1

## 2012-08-24 MED ORDER — ONDANSETRON HCL 4 MG PO TABS: 4.0000 mg | ORAL_TABLET | Freq: Four times a day (QID) | ORAL | Status: DC | PRN

## 2012-08-24 MED ORDER — LORAZEPAM 2 MG/ML IJ SOLN: 2.0000 mg | Freq: Once | INTRAMUSCULAR | Status: DC

## 2012-08-24 MED ORDER — HYDROMORPHONE HCL PF 1 MG/ML IJ SOLN
0.5000 mg | INTRAMUSCULAR | Status: DC | PRN
  Administered 2012-08-25 – 2012-08-26 (×11): 0.5 mg via INTRAVENOUS
  Filled 2012-08-24 (×10): qty 1
  Filled 2012-08-24: qty 2
  Filled 2012-08-24 (×2): qty 1

## 2012-08-24 MED ORDER — ONDANSETRON HCL 4 MG/2ML IJ SOLN: 4.0000 mg | Freq: Four times a day (QID) | INTRAMUSCULAR | Status: DC | PRN

## 2012-08-24 NOTE — ED Provider Notes (Signed)
History     CSN: [id number]  Arrival date & time 08/24/12  1615   First MD Initiated Contact with Patient 08/24/12 1633      Chief Complaint  Patient presents with  . Altered Mental Status     The history is provided by the spouse and medical records. History Limited By: Level V caveat: Altered mental status.   patient is a long-standing history of trigeminal neuralgia.  She has had surgery approximately 4-5 years ago which did not relieve her symptoms.  [facility] was [facility] one week ago for a gamma knife procedure by Dr. Johny Drilling.  The family called Dr. Johny Drilling today because of new altered mental status and confusion.  Patient has no psychiatric history.  She's been on her current medications for a long-standing period time.  Additional history was obtained from the grandmother who is present with the patient all day.  The husband states that he saw the patient and she appeared to be normal at 6:30 when he left the house.  The grandparents came over and around 10 and had breakfast with the patient who at that time seemed normal addendum 10-15 minutes later after going to her bedroom for 15 minutes she then came back and was complaining of her legs itching and burning and then began pacing back and forth from the couch to the bedroom.  At one point she brought out some close in a basket and slammed on the counter.  She was not making much sense to the family.  She tried to leave the house several times but got the wrong keys to the car.  Patient does not abuse drugs.  She has no psychiatric history.  She's not reported to drink alcohol.  Her husband came home in the afternoon and noted her to be very confused and noticed that her pupils are very dilated.  He does not know of any prior suicide attempts and does not believe this was an intentional overdose.  He is unsure what medications are in the house.  He is unsure if there is bedroom house.  He does report there is Nyquil in the  house.   Past Medical History  Diagnosis Date  . Stroke   . Hypertension   . Anxiety   . Panic attacks   . Trigeminal neuralgia   . Hypercholesteremia     Past Surgical History  Procedure Date  . Brain surgery   . Appendectomy   . Tonsillectomy     No family history on file.  History  Substance Use Topics  . Smoking status: Never Smoker   . Smokeless tobacco: Not on file  . Alcohol Use: Yes     occ    OB History    Grav Para Term Preterm Abortions TAB SAB Ect Mult Living                  Review of Systems  Unable to perform ROS   Allergies  Review of patient's allergies indicates no known allergies.  Home Medications   Current Outpatient Rx  Name Route Sig Dispense Refill  . ALPRAZOLAM 1 MG PO TABS Oral Take 1 mg by mouth every 6 (six) hours as needed. Nerves/anxiety    . ASPIRIN EC 81 MG PO TBEC Oral Take 81 mg by mouth daily.      Marland Kitchen BACLOFEN 10 MG PO TABS Oral Take 10 mg by mouth 3 (three) times daily.      Marland Kitchen  VITAMIN B-12 PO Oral Take 1 tablet by mouth daily.    Marland Kitchen GABAPENTIN ENACARBIL ER 600 MG PO TB24 Oral Take 600 mg by mouth every 12 (twelve) hours.    . IBUPROFEN 800 MG PO TABS Oral Take 800 mg by mouth 3 (three) times daily.     Marland Kitchen LISINOPRIL-HYDROCHLOROTHIAZIDE 20-25 MG PO TABS Oral Take 1 tablet by mouth daily.    Marland Kitchen OXCARBAZEPINE 300 MG PO TABS Oral Take 300 mg by mouth 3 (three) times daily.    . OXYCODONE HCL ER 15 MG PO TB12 Oral Take 15 mg by mouth every 12 (twelve) hours.    Marland Kitchen TAPENTADOL HCL 75 MG PO TABS Oral Take 75 mg by mouth daily.    Jeananne Rama SULFATE 0.05-0.25 % OP SOLN Both Eyes Place 2 drops into both eyes daily as needed. Dry Eyes    . TRAMADOL HCL 50 MG PO TABS Oral Take 100 mg by mouth 4 (four) times daily as needed. Sleep    . VITAMIN D (CHOLECALCIFEROL) PO Oral Take 1 tablet by mouth daily.    Marland Kitchen VITAMIN E PO Oral Take 1 tablet by mouth daily.    Marland Kitchen ZOLPIDEM TARTRATE 10 MG PO TABS Oral Take 10 mg by mouth at bedtime as  needed. Sleep      BP 134/101  Pulse 87  Temp 99.3 F (37.4 C) (Oral)  Resp 18  SpO2 96%  LMP 07/27/2012  Physical Exam  Nursing note and vitals reviewed. Constitutional: She appears well-developed and well-nourished. No distress.  HENT:  Head: Normocephalic and atraumatic.  Eyes: EOM are normal.       Pupils 5-6 mm bilaterally  Neck: Normal range of motion.  Cardiovascular: Normal rate, regular rhythm and normal heart sounds.   Pulmonary/Chest: Effort normal and breath sounds normal.  Abdominal: Soft. She exhibits no distension. There is no tenderness.  Musculoskeletal: Normal range of motion.  Neurological:       States "oh God oh God"when rectal temperature being performed.  Moves all extremities.  Does not follow commands.  Occasionally shakes her head yes and no to certain simple statements.  No facial asymmetry  Skin: Skin is warm and dry.  Psychiatric: She has a normal mood and affect. Judgment normal.    ED Course  Procedures (including critical care time)  Labs Reviewed  CBC - Abnormal; Notable for the following:    RBC 3.77 (*)     All other components within normal limits  COMPREHENSIVE METABOLIC PANEL - Abnormal; Notable for the following:    BUN 4 (*)     Alkaline Phosphatase 27 (*)     Total Bilirubin 0.1 (*)     GFR calc non Af Amer 85 (*)     All other components within normal limits  SALICYLATE LEVEL - Abnormal; Notable for the following:    Salicylate Lvl <2.0 (*)     All other components within normal limits  URINALYSIS, ROUTINE W REFLEX MICROSCOPIC - Abnormal; Notable for the following:    Hgb urine dipstick TRACE (*)     All other components within normal limits  URINE MICROSCOPIC-ADD ON - Abnormal; Notable for the following:    Squamous Epithelial / LPF FEW (*)     All other components within normal limits  ACETAMINOPHEN LEVEL  ETHANOL  PREGNANCY, URINE  GLUCOSE, CAPILLARY  CULTURE, BLOOD (ROUTINE X 2)  CULTURE, BLOOD (ROUTINE X 2)      Date: 08/24/2012  Rate: 89  Rhythm: normal  sinus rhythm  QRS Axis: normal  Intervals: normal  ST/T Wave abnormalities: normal  Conduction Disutrbances: none  Narrative Interpretation:   Old EKG Reviewed: No significant changes noted    Ct Head Wo Contrast  08/24/2012  *RADIOLOGY REPORT*  Clinical Data: Slurred speech and altered mental status.  CT HEAD WITHOUT CONTRAST  Technique:  Contiguous axial images were obtained from the base of the skull through the vertex without contrast.  Comparison: CT head without contrast 07/29/2012.  MRI brain 07/29/2011.  Findings: The patient is status post left occipital craniotomy. Focal encephalomalacia of the left cerebellum is stable.  No acute cortical infarct, hemorrhage, or mass lesion is present.  The ventricles are normal size.  No significant extra-axial fluid collection is present.  The paranasal sinuses and mastoid air cells are clear.  The osseous skull is intact otherwise.  IMPRESSION:  1.  Stable postoperative changes of the left occiput and posterior fossa. 2.  No acute intracranial abnormality or significant interval change.   Original Report Authenticated By: Jamesetta Orleans. MATTERN, M.D.     I personally reviewed the imaging tests through PACS system  I reviewed available ER/hospitalization records thought the EMR    1.  Altered mental status  2.  Apparent anticholinergic toxicity   MDM  This appears to be anticholinergic toxicity.  At this time I will hold physostigmine.  Patient's been monitored throughout her ER stay and does not appear to be getting worse at this time.  She does have acute delirium.  She is flushed red any other mucous membranes are dry.  This may be secondary to dextromethorphan and/or some other drug in house.  Patient will need to be admitted for observation.        Lyanne Co, MD 08/24/12 [phone]

## 2012-08-24 NOTE — ED Notes (Signed)
Patient continues to cry out w/minimal stimulation.

## 2012-08-24 NOTE — ED Notes (Signed)
Patient remains intermittently restless and compulsive.  Does not respond to commands and only repeats "Mommy, Mommy, Mommy".  Mother at bedside.

## 2012-08-24 NOTE — ED Notes (Signed)
According to spouse, patient's parents reported she has been confused and "acting like a caged animal" all day.  Speech is slurred at times and she does not respond verbally to questions.  She is crying constantly which spouse says just started.  Pupils 6/6, reactive.  Spouse states she is on  A lot of pain meds and may have taken too much.

## 2012-08-24 NOTE — ED Notes (Signed)
Report called to Tracey, RN on unit 300. 

## 2012-08-24 NOTE — H&P (Signed)
[facility] History and Physical  Atiyana Welte [facility] ZOX:[id number] DOB: 08-Apr-1971 DOA: 08/24/2012  Referring physician: Domenick Gong PCP: Beryle Beams, MD  Specialists: Dr. Johny Drilling at [facility], Neurosurgery  Chief Complaint: Altered Mental Status  HPI: Mariah Ibarra is a 41 y.o. female with a complex medical history related to many years of severe trigeminal neuralgia s/p craniotomy and partial nerve resection 4-5 years ago and recent  gamma knife radiation 1 week ago at [facility]  presented to the [facility] with acute onset ataxia, dilated pupils, confusion, slurred speech, and agitation. Symptoms started around 10 AM this morning, she was in her  normal state when her husband left for work at 6:30AM. Her mother came over to check on her around 33 AM, they had breakfast and patient went back to her bedroom to rest, but 15 minutes later came out complaining that her legs were burning and she began pacing and becoming agiated which gradulaly worsened throughout the day, when her husband got home at around 4PM he called Dr. Johny Drilling at [facility] who did the gamma knife-he advised them to go to ED for urgent evaluation that this was not a side effect from her procedure.  The patient has no psychiatric history, no suicidal ideation or affective disorders. There have been no recent changes in her medications, no illicit drug or alcohol use. I requested that her husband bring all of her pill bottles from home and any OTC meds that may have been taken- all pill counts were pretty much correct except there was no bottle of Trileptal which was filled at the pharmacy several days ago, her husband does not know where this bottle is located but will check when he returns home. She is on multiple drugs for neuropathic pain including baclofen, tripletal, alprazolam, gabapentin, ambien, nucynta, tramadol, oxycodone and also receives injections by Dr. Judithann Sheen.  Review of Systems: unable to obtain due to patient altered mental  status.  Past Medical History  Diagnosis Date  . Stroke   . Hypertension   . Anxiety   . Panic attacks   . Trigeminal neuralgia   . Hypercholesteremia    Past Surgical History  Procedure Date  . Brain surgery   . Appendectomy   . Tonsillectomy    Social History:  reports that she has never smoked. She has never used smokeless tobacco. She reports that she drinks alcohol. She reports that she does not use illicit drugs. Lives at home with her husband. Independent in ADLs.  No Known Allergies  History reviewed. No pertinent family history.  Family history significant for Hypertension, CAD. No known history of malignancies.  Prior to Admission medications   Medication Sig Start Date End Date Taking? Authorizing Provider  ALPRAZolam Prudy Feeler) 1 MG tablet Take 1 mg by mouth every 6 (six) hours as needed. Nerves/anxiety   Yes Historical Provider, MD  aspirin EC 81 MG tablet Take 81 mg by mouth daily.     Yes Historical Provider, MD  baclofen (LIORESAL) 10 MG tablet Take 10 mg by mouth 3 (three) times daily.     Yes Historical Provider, MD  Cyanocobalamin (VITAMIN B-12 PO) Take 1 tablet by mouth daily.   Yes Historical Provider, MD  Gabapentin Enacarbil (HORIZANT) 600 MG TB24 Take 600 mg by mouth every 12 (twelve) hours.   Yes Historical Provider, MD  ibuprofen (ADVIL,MOTRIN) 800 MG tablet Take 800 mg by mouth 3 (three) times daily.    Yes Historical Provider, MD  lisinopril-hydrochlorothiazide (PRINZIDE,ZESTORETIC) 20-25 MG per tablet  Take 1 tablet by mouth daily.   Yes Historical Provider, MD  Oxcarbazepine (TRILEPTAL) 300 MG tablet Take 300 mg by mouth 3 (three) times daily.   Yes Historical Provider, MD  oxyCODONE (OXYCONTIN) 15 MG TB12 Take 15 mg by mouth every 12 (twelve) hours.   Yes Historical Provider, MD  Tapentadol HCl (NUCYNTA) 75 MG TABS Take 75 mg by mouth daily.   Yes Historical Provider, MD  tetrahydrozoline-zinc (VISINE-AC) 0.05-0.25 % ophthalmic solution Place 2 drops  into both eyes daily as needed. Dry Eyes   Yes Historical Provider, MD  traMADol (ULTRAM) 50 MG tablet Take 100 mg by mouth 4 (four) times daily as needed. Sleep   Yes Historical Provider, MD  VITAMIN D, CHOLECALCIFEROL, PO Take 1 tablet by mouth daily.   Yes Historical Provider, MD  VITAMIN E PO Take 1 tablet by mouth daily.   Yes Historical Provider, MD  zolpidem (AMBIEN) 10 MG tablet Take 10 mg by mouth at bedtime as needed. Sleep   Yes Historical Provider, MD   Physical Exam: Filed Vitals:   08/24/12 1905 08/24/12 1930 08/24/12 2106 08/24/12 2129  BP: 124/94  151/98 148/92  Pulse:  92  72  Temp:    97.5 F (36.4 C)  TempSrc:    Oral  Resp:    20  SpO2:  97% 98% 100%     General:  Agitated, Stuporous, Does not follow commands, appears severely intoxicated-sighing and moaning  Eyes:  VERY LARGE DILATED PUPILS, minimally reactive to light, significant proptosis  ENT: grimaces and guards left side of face, winces to exam, no lacrimation, cheek on left appears to be swollen with small scab over cheek.  Neck: supple, no adenopathy  Cardiovascular: RRR  Respiratory: CTAB  Abdomen: S, NT, no HSM  Skin: no rashes, no lesions  Musculoskeletal: moving all 4 extremities spontaneously, was able to get OOB for full assist and use BSC.   Psychiatric: unable to assess, disoriented state  Neurologic: stupor, reflexes brisk, no gaze deviation, ataxic shuffling gait  Labs on Admission:  Basic Metabolic Panel:  Lab 08/24/12 1914  NA 143  K 3.5  CL 107  CO2 26  GLUCOSE 93  BUN 4*  CREATININE 0.84  CALCIUM 9.1  MG --  PHOS --   Liver Function Tests:  Lab 08/24/12 1644  AST 22  ALT 29  ALKPHOS 27*  BILITOT 0.1*  PROT 7.0  ALBUMIN 3.5   No results found for this basename: LIPASE:5,AMYLASE:5 in the last 168 hours No results found for this basename: AMMONIA:5 in the last 168 hours CBC:  Lab 08/24/12 1644  WBC 8.2  NEUTROABS --  HGB 12.7  HCT 37.1  MCV 98.4  PLT  349   Cardiac Enzymes: No results found for this basename: CKTOTAL:5,CKMB:5,CKMBINDEX:5,TROPONINI:5 in the last 168 hours  BNP (last 3 results) No results found for this basename: PROBNP:3 in the last 8760 hours CBG:  Lab 08/24/12 1651  GLUCAP 95    Radiological Exams on Admission: Ct Head Wo Contrast  08/24/2012  *RADIOLOGY REPORT*  Clinical Data: Slurred speech and altered mental status.  CT HEAD WITHOUT CONTRAST  Technique:  Contiguous axial images were obtained from the base of the skull through the vertex without contrast.  Comparison: CT head without contrast 07/29/2012.  MRI brain 07/29/2011.  Findings: The patient is status post left occipital craniotomy. Focal encephalomalacia of the left cerebellum is stable.  No acute cortical infarct, hemorrhage, or mass lesion is present.  The ventricles  are normal size.  No significant extra-axial fluid collection is present.  The paranasal sinuses and mastoid air cells are clear.  The osseous skull is intact otherwise.  IMPRESSION:  1.  Stable postoperative changes of the left occiput and posterior fossa. 2.  No acute intracranial abnormality or significant interval change.   Original Report Authenticated By: Jamesetta Orleans. MATTERN, M.D.     EKG: Independently reviewed. ALL INTERVALS ARE NORMAL, NSR.  PROBLEM LIST: Principal Problem:  *Altered mental status Active Problems:  Overdose, unintentional  Ataxia  Trigeminal neuralgia  Hypertension  Bradycardia  Hyperlipidemia  ASSESSMENT: This is an unfortunate 41 yo woman who has severe trigeminal neuralgia who has come in with an acute change in her mental status, she has no psych history and has never had any similar episodes, according to husband and confirmed by Dr. Johny Drilling that she is meticulous with her meds and this a dramatic change in her baseline mental and cognitive sate. She came in ataxic, with large dilated pupils, bradycardic to 41, hypertensive, low grade temp, stuporous, and  agitated but has all normal lab work including negative tox screens. Pill counts for available bottles were correct, except the trileptal bottle is missing and very well may be the culprit given her presentation. I have discussed the details of this case with the [facility] Poison Control toxicologist -possible anticholinergic OD vs. Baclofen, Seratonin syndrome or carbamazapine-doubtful that this is from opiate or benzo or a withdrawal syndrome given the presentation. This could also be a severe atypical reaction to gamma knife radiation or atypical seizure. Nothing to suggest infection or metabolic derangements. She has stable vital signs currently and is maintaining her airway. CT scan of her head showed nothing acute.  PLAN:  1. Main goal is supportive care for possible unintentional OD on her neuropathic pain/anticonvulsant /muscle relaxant meds or possible OTC med OD. IV fluids at 200/hr, sitter at bedside, frequent neuro checks q2, seizure precautions, monitor on tele.  2. I have held all of her oral medications given unknown etiology of toxicity but provided dilaudid for opiate pain control and lorazepam IV for her severe agitation and for seizure prophylaxis (confirmed with toxicologist that this would be in order given her clinical agitation and presentation).   3. Monitor closely for next 24 hours, if she improves then it was likely drug toxicity, if she continunes to be altered consider transfer to [facility] to her neurosurgeons service.   4. Work up pending - I added full drugs of abuse screen (full panel not just in house lab), oxcarbamazapine level, and also ordered an MRI of her Brain-she made need EEG and neuro evaluation.  5. Will repeat a 12 lead EKG in am to check her intervals.  Code Status: Full Code Family Communication: Plan of care discussed in detail with her husband and mother who were at the bedside. Disposition Plan: Admitted as observation for supportive care from possible  overdose/toxicity, if she does not improve in 24 hours, I would recommend having her transferred to [facility] to Dr. Ruthe Mannan service for management.  Time spent: 70 minutes  [facility] Pager [phone]  If 7PM-7AM, please contact night-coverage [email or web address] Password [email or web address] 08/24/2012, 10:17 PM

## 2012-08-25 ENCOUNTER — Observation Stay (HOSPITAL_COMMUNITY): Payer: BC Managed Care – PPO

## 2012-08-25 ENCOUNTER — Observation Stay (HOSPITAL_COMMUNITY)
Admit: 2012-08-25 | Discharge: 2012-08-25 | Disposition: A | Discharge status: Home / Self Care | Payer: BC Managed Care – PPO | Attending: Neurology | Admitting: Neurology

## 2012-08-25 DIAGNOSIS — G500 Trigeminal neuralgia: Secondary | ICD-10-CM

## 2012-08-25 DIAGNOSIS — I10 Essential (primary) hypertension: Secondary | ICD-10-CM

## 2012-08-25 LAB — COMPREHENSIVE METABOLIC PANEL
ALT: 18 U/L (ref 0–35)
Calcium: 8.6 mg/dL (ref 8.4–10.5)
GFR calc Af Amer: >90 mL/min (ref >90)
Glucose, Bld: 80 mg/dL (ref 70–99)
Sodium: 144 mEq/L (ref 135–145)
Total Protein: 5.9 g/dL — ABNORMAL LOW (ref 6.0–8.3)

## 2012-08-25 LAB — CBC
HCT: 36.1 % (ref 36.0–46.0)
Hemoglobin: 12.3 g/dL (ref 12.0–15.0)
MCHC: 34.1 g/dL (ref 30.0–36.0)
RBC: 3.67 MIL/uL — ABNORMAL LOW (ref 3.87–5.11)
WBC: 8 10*3/uL (ref 4.0–10.5)

## 2012-08-25 MED ORDER — LISINOPRIL 10 MG PO TABS
20.0000 mg | ORAL_TABLET | Freq: Every day | ORAL | Status: DC
  Administered 2012-08-25 – 2012-08-26 (×2): 20 mg via ORAL
  Filled 2012-08-25: qty 2
  Filled 2012-08-25: qty 1

## 2012-08-25 MED ORDER — LORAZEPAM 1 MG PO TABS
1.0000 mg | ORAL_TABLET | Freq: Every day | ORAL | Status: DC
  Administered 2012-08-25: 1 mg via ORAL
  Filled 2012-08-25: qty 1

## 2012-08-25 MED ORDER — SODIUM CHLORIDE 0.9 % IJ SOLN
INTRAMUSCULAR | Status: AC
  Administered 2012-08-25: 13:00:00
  Filled 2012-08-25: qty 3

## 2012-08-25 MED ORDER — GABAPENTIN 400 MG PO CAPS
800.0000 mg | ORAL_CAPSULE | Freq: Three times a day (TID) | ORAL | Status: DC
  Administered 2012-08-25 – 2012-08-26 (×4): 800 mg via ORAL
  Filled 2012-08-25 (×4): qty 2

## 2012-08-25 MED ORDER — LISINOPRIL-HYDROCHLOROTHIAZIDE 20-25 MG PO TABS: 1.0000 | ORAL_TABLET | Freq: Every day | ORAL | Status: DC

## 2012-08-25 MED ORDER — HYDROCHLOROTHIAZIDE 25 MG PO TABS
25.0000 mg | ORAL_TABLET | Freq: Every day | ORAL | Status: DC
  Administered 2012-08-25 – 2012-08-26 (×2): 25 mg via ORAL
  Filled 2012-08-25 (×2): qty 1

## 2012-08-25 MED ORDER — SODIUM CHLORIDE 0.9 % IV SOLN
INTRAVENOUS | Status: DC
  Administered 2012-08-25: 20 mL/h via INTRAVENOUS

## 2012-08-25 NOTE — Progress Notes (Signed)
UR Chart Review Completed  

## 2012-08-25 NOTE — Consult Note (Signed)
[facility] Syvanna Ciolino A. Gerilyn Pilgrim, MD     [email or web address]         Mariah Ibarra is an 41 y.o. female.  HPI: This is a 41 year old right-handed white female who has a long history of severe Left facial headache due to trigeminal neuralgia. The patient underwent a microdissection and decompression 3 years ago was very successful. Unfortunately, the effect only lasted for about a year. The symptoms started to recur and she had to be placed back on medications. Patient had to be maintained on multiple medications with opioids used for break though pain management. We have been seeing the patient for last couple of years. She previously was being seen treated by Dr. Ninetta Lights. The patient has tried multiple medications but to my recollection and from her old notes, she was to be on a gabapentin on medication, baclofen, Ultram and oxycodone. She also was started on Nucynta but did not like this medication and was not to take it on a consistent basis. She did have untoward side effects with Trileptal and although this is on her medication list, she was not to take this. This medication has not been filled in a few months. She actually had some cognitive changes and gait ataxia similar to the way she presents with during this hospitalization. At that point in time, she was taking Combination of Trileptal and Cymbalta. Consequently, these medications have been discontinued. The patient was referred back to the neurosurgeons at [facility] for additional and further surgical management. They elected to treat the patient with gamma knife. She underwent this about over a week ago. She had no issues with this but on yesterday it appears that she developed a relatively acute onset of confusion, disorientation and gait ataxia. He apparently has been no significant changes in her medications. The admission notes indicate that she has been taking Trileptal but the patient denies this. We will also wanted the patient to  discontinue the Nucynta as oxycodone was restarted. She apparently still taking the Nucynta although not together with the oxycodone and of the Ultram. The father reports that the patient did have good comprehension but had difficulties with expression of speech. She also was confused and disoriented. She appears to be somewhat agitated. She did have a workup in the emergency room which is mostly unremarkable. She apparently was noted to have dilated pupils on examination during the emergency room yesterday. The father reports that her thought processes and thinking is fairly small morning than he was yesterday. The patient herself is complaining of a lot of left facial pain and is requesting injections. Injections have been very effective but unfortunately the last for about a week.  Past Medical History  Diagnosis Date  . Stroke   . Hypertension   . Anxiety   . Panic attacks   . Trigeminal neuralgia   . Hypercholesteremia     Past Surgical History  Procedure Date  . Brain surgery   . Appendectomy   . Tonsillectomy     History reviewed. No pertinent family history.  Social History:  reports that she has never smoked. She has never used smokeless tobacco. She reports that she drinks alcohol. She reports that she does not use illicit drugs.  Allergies: No Known Allergies  Medications:  Prior to Admission medications   Medication Sig Start Date End Date Taking? Authorizing Provider  ALPRAZolam Prudy Feeler) 1 MG tablet Take 1 mg by mouth every 6 (six) hours as needed. Nerves/anxiety   Yes Historical Provider, MD  aspirin EC 81 MG tablet Take 81 mg by mouth daily.     Yes Historical Provider, MD  baclofen (LIORESAL) 10 MG tablet Take 10 mg by mouth 3 (three) times daily.     Yes Historical Provider, MD  Cyanocobalamin (VITAMIN B-12 PO) Take 1 tablet by mouth daily.   Yes Historical Provider, MD  Gabapentin Enacarbil (HORIZANT) 600 MG TB24 Take 600 mg by mouth every 12 (twelve) hours.   Yes  Historical Provider, MD  ibuprofen (ADVIL,MOTRIN) 800 MG tablet Take 800 mg by mouth 3 (three) times daily.    Yes Historical Provider, MD  lisinopril-hydrochlorothiazide (PRINZIDE,ZESTORETIC) 20-25 MG per tablet Take 1 tablet by mouth daily.   Yes Historical Provider, MD  Oxcarbazepine (TRILEPTAL) 300 MG tablet Take 300 mg by mouth 3 (three) times daily.   Yes Historical Provider, MD  oxyCODONE (OXYCONTIN) 15 MG TB12 Take 15 mg by mouth every 12 (twelve) hours.   Yes Historical Provider, MD  Tapentadol HCl (NUCYNTA) 75 MG TABS Take 75 mg by mouth daily.   Yes Historical Provider, MD  tetrahydrozoline-zinc (VISINE-AC) 0.05-0.25 % ophthalmic solution Place 2 drops into both eyes daily as needed. Dry Eyes   Yes Historical Provider, MD  traMADol (ULTRAM) 50 MG tablet Take 100 mg by mouth 4 (four) times daily as needed. Sleep   Yes Historical Provider, MD  VITAMIN D, CHOLECALCIFEROL, PO Take 1 tablet by mouth daily.   Yes Historical Provider, MD  VITAMIN E PO Take 1 tablet by mouth daily.   Yes Historical Provider, MD  zolpidem (AMBIEN) 10 MG tablet Take 10 mg by mouth at bedtime as needed. Sleep   Yes Historical Provider, MD    Scheduled Meds:   . acetaminophen  650 mg Rectal Once  . lisinopril  20 mg Oral Daily   And  . hydrochlorothiazide  25 mg Oral Daily  .  HYDROmorphone (DILAUDID) injection  0.5 mg Intravenous Once  .  HYDROmorphone (DILAUDID) injection  1 mg Intravenous Once  . LORazepam      . LORazepam  0.5 mg Intravenous Once  . LORazepam  2 mg Intravenous Once  . sodium chloride  3 mL Intravenous Q12H  . DISCONTD: lisinopril-hydrochlorothiazide  1 tablet Oral Daily   Continuous Infusions:   . sodium chloride 200 mL/hr at 08/25/12 0352   PRN Meds:.alum & mag hydroxide-simeth, bisacodyl, HYDROmorphone (DILAUDID) injection, LORazepam, ondansetron (ZOFRAN) IV, ondansetron   Results for orders placed during the hospital encounter of 08/24/12 (from the past 48 hour(s))  CBC      Status: Abnormal   Collection Time   08/24/12  4:44 PM      Component Value Range Comment   WBC 8.2  4.0 - 10.5 K/uL    RBC 3.77 (*) 3.87 - 5.11 MIL/uL    Hemoglobin 12.7  12.0 - 15.0 g/dL    HCT 24.4  01.0 - 27.2 %    MCV 98.4  78.0 - 100.0 fL    MCH 33.7  26.0 - 34.0 pg    MCHC 34.2  30.0 - 36.0 g/dL    RDW 53.6  64.4 - 03.4 %    Platelets 349  150 - 400 K/uL   COMPREHENSIVE METABOLIC PANEL     Status: Abnormal   Collection Time   08/24/12  4:44 PM      Component Value Range Comment   Sodium 143  135 - 145 mEq/L    Potassium 3.5  3.5 - 5.1 mEq/L    Chloride  107  96 - 112 mEq/L    CO2 26  19 - 32 mEq/L    Glucose, Bld 93  70 - 99 mg/dL    BUN 4 (*) 6 - 23 mg/dL    Creatinine, Ser 8.29  0.50 - 1.10 mg/dL    Calcium 9.1  8.4 - 56.2 mg/dL    Total Protein 7.0  6.0 - 8.3 g/dL    Albumin 3.5  3.5 - 5.2 g/dL    AST 22  0 - 37 U/L    ALT 29  0 - 35 U/L    Alkaline Phosphatase 27 (*) 39 - 117 U/L    Total Bilirubin 0.1 (*) 0.3 - 1.2 mg/dL    GFR calc non Af Amer 85 (*) >90 mL/min    GFR calc Af Amer >90  >90 mL/min   ACETAMINOPHEN LEVEL     Status: Normal   Collection Time   08/24/12  4:44 PM      Component Value Range Comment   Acetaminophen (Tylenol), Serum <15.0  10 - 30 ug/mL   SALICYLATE LEVEL     Status: Abnormal   Collection Time   08/24/12  4:44 PM      Component Value Range Comment   Salicylate Lvl <2.0 (*) 2.8 - 20.0 mg/dL   ETHANOL     Status: Normal   Collection Time   08/24/12  4:44 PM      Component Value Range Comment   Alcohol, Ethyl (B) <11  0 - 11 mg/dL   GLUCOSE, CAPILLARY     Status: Normal   Collection Time   08/24/12  4:51 PM      Component Value Range Comment   Glucose-Capillary 95  70 - 99 mg/dL   URINALYSIS, ROUTINE W REFLEX MICROSCOPIC     Status: Abnormal   Collection Time   08/24/12  5:00 PM      Component Value Range Comment   Color, Urine YELLOW  YELLOW    APPearance CLEAR  CLEAR    Specific Gravity, Urine 1.020  1.005 - 1.030    pH  7.5  5.0 - 8.0    Glucose, UA NEGATIVE  NEGATIVE mg/dL    Hgb urine dipstick TRACE (*) NEGATIVE    Bilirubin Urine NEGATIVE  NEGATIVE    Ketones, ur NEGATIVE  NEGATIVE mg/dL    Protein, ur NEGATIVE  NEGATIVE mg/dL    Urobilinogen, UA 0.2  0.0 - 1.0 mg/dL    Nitrite NEGATIVE  NEGATIVE    Leukocytes, UA NEGATIVE  NEGATIVE   PREGNANCY, URINE     Status: Normal   Collection Time   08/24/12  5:00 PM      Component Value Range Comment   Preg Test, Ur NEGATIVE  NEGATIVE   URINE MICROSCOPIC-ADD ON     Status: Abnormal   Collection Time   08/24/12  5:00 PM      Component Value Range Comment   Squamous Epithelial / LPF FEW (*) RARE    WBC, UA 0-2  <3 WBC/hpf    RBC / HPF 0-2  <3 RBC/hpf    Bacteria, UA RARE  RARE   URINE RAPID DRUG SCREEN (HOSP PERFORMED)     Status: Abnormal   Collection Time   08/24/12  9:15 PM      Component Value Range Comment   Opiates NONE DETECTED  NONE DETECTED    Cocaine NONE DETECTED  NONE DETECTED    Benzodiazepines POSITIVE (*) NONE DETECTED  Amphetamines NONE DETECTED  NONE DETECTED    Tetrahydrocannabinol NONE DETECTED  NONE DETECTED    Barbiturates NONE DETECTED  NONE DETECTED   COMPREHENSIVE METABOLIC PANEL     Status: Abnormal   Collection Time   08/25/12  5:04 AM      Component Value Range Comment   Sodium 144  135 - 145 mEq/L    Potassium 3.5  3.5 - 5.1 mEq/L    Chloride 114 (*) 96 - 112 mEq/L    CO2 21  19 - 32 mEq/L    Glucose, Bld 80  70 - 99 mg/dL    BUN 5 (*) 6 - 23 mg/dL    Creatinine, Ser 1.61  0.50 - 1.10 mg/dL    Calcium 8.6  8.4 - 09.6 mg/dL    Total Protein 5.9 (*) 6.0 - 8.3 g/dL    Albumin 2.8 (*) 3.5 - 5.2 g/dL    AST 14  0 - 37 U/L    ALT 18  0 - 35 U/L    Alkaline Phosphatase 18 (*) 39 - 117 U/L    Total Bilirubin 0.2 (*) 0.3 - 1.2 mg/dL    GFR calc non Af Amer >90  >90 mL/min    GFR calc Af Amer >90  >90 mL/min   CBC     Status: Abnormal   Collection Time   08/25/12  5:04 AM      Component Value Range Comment   WBC  8.0  4.0 - 10.5 K/uL    RBC 3.67 (*) 3.87 - 5.11 MIL/uL    Hemoglobin 12.3  12.0 - 15.0 g/dL    HCT 04.5  40.9 - 81.1 %    MCV 98.4  78.0 - 100.0 fL    MCH 33.5  26.0 - 34.0 pg    MCHC 34.1  30.0 - 36.0 g/dL    RDW 91.4  78.2 - 95.6 %    Platelets 308  150 - 400 K/uL     Ct Head Wo Contrast  08/24/2012  *RADIOLOGY REPORT*  Clinical Data: Slurred speech and altered mental status.  CT HEAD WITHOUT CONTRAST  Technique:  Contiguous axial images were obtained from the base of the skull through the vertex without contrast.  Comparison: CT head without contrast 07/29/2012.  MRI brain 07/29/2011.  Findings: The patient is status post left occipital craniotomy. Focal encephalomalacia of the left cerebellum is stable.  No acute cortical infarct, hemorrhage, or mass lesion is present.  The ventricles are normal size.  No significant extra-axial fluid collection is present.  The paranasal sinuses and mastoid air cells are clear.  The osseous skull is intact otherwise.  IMPRESSION:  1.  Stable postoperative changes of the left occiput and posterior fossa. 2.  No acute intracranial abnormality or significant interval change.   Original Report Authenticated By: Jamesetta Orleans. MATTERN, M.D.     Review of Systems  Constitutional: Positive for malaise/fatigue.  Eyes: Negative.   Respiratory: Negative.   Cardiovascular: Negative.   Gastrointestinal: Negative.   Genitourinary: Negative.   Skin: Negative.   Neurological: Positive for headaches.  Psychiatric/Behavioral: Negative.    Blood pressure 154/106, pulse 80, temperature 97.3 F (36.3 C), temperature source Oral, resp. rate 20, height 5\' 2"  (1.575 m), weight 75.342 kg (166 lb 1.6 oz), last menstrual period 07/27/2012, SpO2 100.00%. Physical Exam  GENERAL: The patient looks on unrested and tired. She is different than is typically seen in the office. She is somewhat restless and clearly appears  to be in pain.  HEENT: She appears to have mild edema  of the face especially the orbits bilaterally. Neck is supple. Head is normocephalic atraumatic.  ABDOMEN: soft  EXTREMITIES: No edema   BACK: Unremarkable.  SKIN: Normal by inspection.    MENTAL STATUS: The patient is somewhat drowsy at times. She recognizes me. She is oriented 2. Does follow commands well. She appears to have some mild naming impairment and at times perseverates. She followed commands well.   CRANIAL NERVES: Pupils are equal, round and reactive to light and accomodation; extra ocular movements are full, there is no significant nystagmus; visual fields are full; upper and lower facial muscles are normal in strength and symmetric, there is no flattening of the nasolabial folds; tongue is midline; uvula is midline; shoulder elevation is normal.  MOTOR:  The patient has a Right hemiparesis graded as 3/5 in the both the upper and lower extremity. The left side shows normal tone and bulk and strength.  COORDINATION: Left finger to nose is normal, right finger to nose is normal, No rest tremor; no intention tremor; no postural tremor; no bradykinesia.  REFLEXES: Deep tendon reflexes are symmetrical and normal. Babinski reflexes are flexor On the left and equivocal on the right.   SENSATION: Normal to light touch.   Assessment/Plan: AMS with left hemispheric dysfunction. She appears to have subtle evidence of the language impairment/aphasia and right hemiparesis. Possibly side effects from gamma knife.  The etiology also includes possible acute stroke in the partial seizures. Medication effect is always a issue especially since the patient is on multiple psychotropic medications. There is a potential of serotonin syndrome with the Ultram and Nucynta although she is not on high doses and is on no other serotonergic medication such as a SSRI or a SNRI.  The patient has a brain MRI which is pending. A EEG will also be obtained. Given that she has severe pain, Neurontin will be  restarted. We will also consider further discussion with Dr. Johny Drilling at [facility] for any additional suggestions. I agree with holding the nucynta , Ultram and baclofen.   Justa Hatchell 08/25/2012, 8:29 AM

## 2012-08-25 NOTE — Procedures (Signed)
[facility] Delron Comer A. Gerilyn Pilgrim, MD     [email or web address]        FACILITY: [facility]  PHYSICIAN: Joretta Eads A. Gerilyn Pilgrim, M.D.  DATE OF PROCEDURE: 08/25/2012 EEG INTERPRETATION  INDICATION: This is a 41 year old female who presents with confusion. The specimen did not evaluate for possible complex partial seizures.  MEDICATIONS:  Scheduled Meds:   . gabapentin  800 mg Oral TID  . lisinopril  20 mg Oral Daily   And  . hydrochlorothiazide  25 mg Oral Daily  .  HYDROmorphone (DILAUDID) injection  0.5 mg Intravenous Once  .  HYDROmorphone (DILAUDID) injection  1 mg Intravenous Once  . LORazepam      . LORazepam  0.5 mg Intravenous Once  . LORazepam  2 mg Intravenous Once  . sodium chloride  3 mL Intravenous Q12H  . sodium chloride      . DISCONTD: lisinopril-hydrochlorothiazide  1 tablet Oral Daily   Continuous Infusions:   . sodium chloride 150 mL/hr at 08/25/12 1229   PRN Meds:.alum & mag hydroxide-simeth, bisacodyl, HYDROmorphone (DILAUDID) injection, LORazepam, ondansetron (ZOFRAN) IV, ondansetron   ANALYSIS: A 16-channel recording using standard 10/20 measurements is conducted for 20 minutes. There is a well-formed posterior dominant rhythm of 7.5 hertz which attenuates with eye opening. There is beta activity observed in the frontal areas. Awake and drowsy activities are observed. Photic stimulation and hyperventilation were not carried out. No focal or lateralized slowing is observed. No epileptiform activity is observed.   IMPRESSION:  This recording shows mild generalized slowing otherwise unremarkable. There is no epileptiform activity observed.  Tylynn Braniff A. Gerilyn Pilgrim, M.D. Diplomate, Biomedical engineer of Psychiatry and Neurology ( Neurology).

## 2012-08-25 NOTE — Progress Notes (Signed)
     Subjective: This lady came in yesterday with altered mental status. She recently had a procedure with gamma knife radiation one week ago at [facility]. This morning she still is somewhat drowsy but seems to obey my commands.           Physical Exam: Blood pressure 154/106, pulse 80, temperature 97.3 F (36.3 C), temperature source Oral, resp. rate 20, height 5\' 2"  (1.575 m), weight 75.342 kg (166 lb 1.6 oz), last menstrual period 07/27/2012, SpO2 100.00%. Drowsy somewhat but able to follow commands. She was able to point her left thumb to her nose. There are no obvious focal neurological signs. Heart sounds are present and normal. Lung fields are clear. There is no hyperreflexia.   Investigations:     Basic Metabolic Panel:  Basename 08/25/12 0504 08/24/12 1644  NA 144 143  K 3.5 3.5  CL 114* 107  CO2 21 26  GLUCOSE 80 93  BUN 5* 4*  CREATININE 0.65 0.84  CALCIUM 8.6 9.1  MG -- --  PHOS -- --   Liver Function Tests:  [facility] 08/25/12 0504 08/24/12 1644  AST 14 22  ALT 18 29  ALKPHOS 18* 27*  BILITOT 0.2* 0.1*  PROT 5.9* 7.0  ALBUMIN 2.8* 3.5     CBC:  Basename 08/25/12 0504 08/24/12 1644  WBC 8.0 8.2  NEUTROABS -- --  HGB 12.3 12.7  HCT 36.1 37.1  MCV 98.4 98.4  PLT 308 349    Ct Head Wo Contrast  08/24/2012  *RADIOLOGY REPORT*  Clinical Data: Slurred speech and altered mental status.  CT HEAD WITHOUT CONTRAST  Technique:  Contiguous axial images were obtained from the base of the skull through the vertex without contrast.  Comparison: CT head without contrast 07/29/2012.  MRI brain 07/29/2011.  Findings: The patient is status post left occipital craniotomy. Focal encephalomalacia of the left cerebellum is stable.  No acute cortical infarct, hemorrhage, or mass lesion is present.  The ventricles are normal size.  No significant extra-axial fluid collection is present.  The paranasal sinuses and mastoid air cells are clear.  The osseous skull  is intact otherwise.  IMPRESSION:  1.  Stable postoperative changes of the left occiput and posterior fossa. 2.  No acute intracranial abnormality or significant interval change.   Original Report Authenticated By: Jamesetta Orleans. MATTERN, M.D.       Medications: I have reviewed the patient's current medications.  Impression: 1. Altered mental status, suspect drug effects. I do not believe this is related to gamma knife radiation. 2. Trigeminal neuralgia, severe, status post craniotomy and partial resection 4-5 years ago and recent gamma knife radiation one week ago. Ongoing neuropathic pain, on several medications. 3. Hypertension, uncontrolled.     Plan: 1. Reduce IV fluids now. 2. Reinstitute antihypertensive medication. 3. Neurology consultation for further recommendations.     LOS: 1 day   Wilson Singer Pager [phone]  08/25/2012, 8:12 AM

## 2012-08-25 NOTE — Care Management Note (Unsigned)
    Page 1 of 1   08/25/2012     1:24:15 PM   CARE MANAGEMENT NOTE 08/25/2012  Patient:  Mariah Ibarra, Mariah Ibarra   Account Number:  [id number]  Date Initiated:  08/25/2012  Documentation initiated by:  Sharrie Rothman  Subjective/Objective Assessment:   Pt admitted from home with altered mental status. Pt lives with her fiance and will return home with him at discharge. Pt is indpendent with ADL's.     Action/Plan:   No CM or HH needs noted.   Anticipated DC Date:  08/26/2012   Anticipated DC Plan:  HOME/SELF CARE      DC Planning Services  CM consult      Choice offered to / List presented to:             Status of service:  Completed, signed off Medicare Important Message given?   (If response is "NO", the following Medicare IM given date fields will be blank) Date Medicare IM given:   Date Additional Medicare IM given:    Discharge Disposition:    Per UR Regulation:    If discussed at Long Length of Stay Meetings, dates discussed:    Comments:  08/25/12 1323 Arlyss Queen, RN BSN CM

## 2012-08-26 LAB — DRUGS OF ABUSE SCREEN W/O ALC, ROUTINE URINE
Amphetamine Screen, Ur: NEGATIVE
Barbiturate Quant, Ur: NEGATIVE
Benzodiazepines.: POSITIVE — AB
Methadone: NEGATIVE
Phencyclidine (PCP): NEGATIVE

## 2012-08-26 LAB — BASIC METABOLIC PANEL
Calcium: 8.7 mg/dL (ref 8.4–10.5)
Creatinine, Ser: 0.64 mg/dL (ref 0.50–1.10)
GFR calc Af Amer: >90 mL/min (ref >90)
GFR calc non Af Amer: >90 mL/min (ref >90)
Sodium: 139 mEq/L (ref 135–145)

## 2012-08-26 LAB — CBC
MCH: 33.8 pg (ref 26.0–34.0)
MCHC: 35.1 g/dL (ref 30.0–36.0)
MCV: 96.3 fL (ref 78.0–100.0)
Platelets: 341 10*3/uL (ref 150–400)
RBC: 4.05 MIL/uL (ref 3.87–5.11)
RDW: 11.6 % (ref 11.5–15.5)

## 2012-08-26 MED ORDER — SODIUM CHLORIDE 0.9 % IJ SOLN
INTRAMUSCULAR | Status: AC
  Administered 2012-08-26: 10:00:00
  Filled 2012-08-26: qty 3

## 2012-08-26 MED ORDER — HYDROMORPHONE HCL PF 1 MG/ML IJ SOLN
1.0000 mg | Freq: Once | INTRAMUSCULAR | Status: AC
  Administered 2012-08-26: 1 mg via INTRAVENOUS

## 2012-08-26 MED ORDER — HYDROMORPHONE HCL PF 1 MG/ML IJ SOLN
2.0000 mg | INTRAMUSCULAR | Status: DC | PRN
  Administered 2012-08-26: 2 mg via INTRAVENOUS
  Filled 2012-08-26: qty 2

## 2012-08-26 MED ORDER — HYDROMORPHONE HCL PF 1 MG/ML IJ SOLN
1.5000 mg | INTRAMUSCULAR | Status: AC
  Administered 2012-08-26: 1.5 mg via INTRAVENOUS

## 2012-08-26 NOTE — Progress Notes (Signed)
IV removed, site WNL.  Pt given d/c instructions and new prescriptions.  Discussed home care with patient and discussed home medications, patient verbalizes understanding, teachback completed. F/U appointment to be made by pt, pt states they will keep appointment. Pt is stable at this time. Pt taken to main entrance in wheelchair by staff member.

## 2012-08-26 NOTE — Discharge Summary (Signed)
Physician Discharge Summary  Mariah Ibarra [facility] ZOX:[id number] DOB: 1970-12-06 DOA: 08/24/2012  PCP: Beryle Beams, MD  Admit date: 08/24/2012 Discharge date: 08/26/2012  Time spent: Greater than 30 minutes  Recommendations for Outpatient Follow-up:  1. Follow with Dr. Gerilyn Pilgrim, neurology.   Discharge Diagnoses:  1. Altered mental status, secondary to drug effects. 2. Intractable trigeminal neuralgia, status post gamma knife radiation.   Discharge Condition: Stable and improved.  Diet recommendation: Regular.  Filed Weights   08/24/12 2212  Weight: 75.342 kg (166 lb 1.6 oz)    History of present illness:  This 41 year old lady presented to the hospital with symptoms of altered mental status. Please see initial history as outlined below: HPI: Mariah Ibarra is a 41 y.o. female with a complex medical history related to many years of severe trigeminal neuralgia s/p craniotomy and partial nerve resection 4-5 years ago and recent gamma knife radiation 1 week ago at [facility] presented to the [facility] with acute onset ataxia, dilated pupils, confusion, slurred speech, and agitation. Symptoms started around 10 AM this morning, she was in her normal state when her husband left for work at 6:30AM. Her mother came over to check on her around 45 AM, they had breakfast and patient went back to her bedroom to rest, but 15 minutes later came out complaining that her legs were burning and she began pacing and becoming agiated which gradulaly worsened throughout the day, when her husband got home at around 4PM he called Dr. Johny Drilling at [facility] who did the gamma knife-he advised them to go to ED for urgent evaluation that this was not a side effect from her procedure.  The patient has no psychiatric history, no suicidal ideation or affective disorders. There have been no recent changes in her medications, no illicit drug or alcohol use. I requested that her husband bring all of her pill bottles from home and any OTC  meds that may have been taken- all pill counts were pretty much correct except there was no bottle of Trileptal which was filled at the pharmacy several days ago, her husband does not know where this bottle is located but will check when he returns home. She is on multiple drugs for neuropathic pain including baclofen, tripletal, alprazolam, gabapentin, ambien, nucynta, tramadol, oxycodone and also receives injections by Dr. Judithann Sheen.  Hospital Course:  She was admitted and observed. MRI brain scan was done and this was negative for any new/acute event. She is improved spontaneously. Dr. Gerilyn Pilgrim, neurology, saw this patient as an inpatient. EEG was unremarkable, not indicative of seizure. This morning she is much improved and back to her usual mental state. She does have a headache which is a chronic problem for the last 6 years ago. It is my feeling and Dr. Ronal Fear that her change in mental status is likely related to medications.  Procedures:  None.   Consultations:  Neurology, Dr Gerilyn Pilgrim.  Discharge Exam: Filed Vitals:   08/25/12 0701 08/25/12 1400 08/25/12 2100 08/26/12 0452  BP: 154/106 151/99 134/96 130/91  Pulse:  85 91 82  Temp:  98.4 F (36.9 C) 98.1 F (36.7 C) 98.4 F (36.9 C)  TempSrc:  Oral Oral Oral  Resp:  20 20 18   Height:      Weight:      SpO2:  100% 100% 98%    General: She looks systemically well. She is alert and orientated. There are no new focal neurological signs. Cardiovascular: Heart sounds are present and normal. Respiratory: Lung fields are  clear.  Discharge Instructions  Discharge Orders    Future Orders Please Complete By Expires   Diet - low sodium heart healthy      Increase activity slowly          Medication List     As of 08/26/2012 10:01 AM    STOP taking these medications         NUCYNTA 75 MG Tabs   Generic drug: Tapentadol HCl      Oxcarbazepine 300 MG tablet   Commonly known as: TRILEPTAL      traMADol 50 MG tablet    Commonly known as: ULTRAM      TAKE these medications         ALPRAZolam 1 MG tablet   Commonly known as: XANAX   Take 1 mg by mouth every 6 (six) hours as needed. Nerves/anxiety      aspirin EC 81 MG tablet   Take 81 mg by mouth daily.      baclofen 10 MG tablet   Commonly known as: LIORESAL   Take 10 mg by mouth 3 (three) times daily.      HORIZANT 600 MG Tb24   Generic drug: Gabapentin Enacarbil   Take 600 mg by mouth every 12 (twelve) hours.      ibuprofen 800 MG tablet   Commonly known as: ADVIL,MOTRIN   Take 800 mg by mouth 3 (three) times daily.      lisinopril-hydrochlorothiazide 20-25 MG per tablet   Commonly known as: PRINZIDE,ZESTORETIC   Take 1 tablet by mouth daily.      oxyCODONE 15 MG Tb12   Commonly known as: OXYCONTIN   Take 15 mg by mouth every 12 (twelve) hours.      tetrahydrozoline-zinc 0.05-0.25 % ophthalmic solution   Commonly known as: VISINE-AC   Place 2 drops into both eyes daily as needed. Dry Eyes      VITAMIN B-12 PO   Take 1 tablet by mouth daily.      VITAMIN D (CHOLECALCIFEROL) PO   Take 1 tablet by mouth daily.      VITAMIN E PO   Take 1 tablet by mouth daily.      zolpidem 10 MG tablet   Commonly known as: AMBIEN   Take 10 mg by mouth at bedtime as needed. Sleep          The results of significant diagnostics from this hospitalization (including imaging, microbiology, ancillary and laboratory) are listed below for reference.    Significant Diagnostic Studies: Ct Head Wo Contrast  08/24/2012  *RADIOLOGY REPORT*  Clinical Data: Slurred speech and altered mental status.  CT HEAD WITHOUT CONTRAST  Technique:  Contiguous axial images were obtained from the base of the skull through the vertex without contrast.  Comparison: CT head without contrast 07/29/2012.  MRI brain 07/29/2011.  Findings: The patient is status post left occipital craniotomy. Focal encephalomalacia of the left cerebellum is stable.  No acute cortical infarct,  hemorrhage, or mass lesion is present.  The ventricles are normal size.  No significant extra-axial fluid collection is present.  The paranasal sinuses and mastoid air cells are clear.  The osseous skull is intact otherwise.  IMPRESSION:  1.  Stable postoperative changes of the left occiput and posterior fossa. 2.  No acute intracranial abnormality or significant interval change.   Original Report Authenticated By: Jamesetta Orleans. MATTERN, M.D.    Ct Head Wo Contrast  07/29/2012  *RADIOLOGY REPORT*  Clinical Data: Headache and slurred  speech.  CT HEAD WITHOUT CONTRAST  Technique:  Contiguous axial images were obtained from the base of the skull through the vertex without contrast.  Comparison: Head CT scan 07/25/2011 and brain MRI 07/29/2011.  Findings: Left occipital craniectomy defect with plate and screws in place is again seen.  The brain appears normal without evidence of infarction, hemorrhage, mass lesion, mass effect, midline shift or abnormal extra-axial fluid collection.  No hydrocephalus or pneumocephalus.  IMPRESSION: No acute finding.  Postoperative change left occipital bone noted as on prior studies.   Original Report Authenticated By: Bernadene Bell. Maricela Curet, M.D.    Mr Brain Wo Contrast  08/25/2012  *RADIOLOGY REPORT*  Clinical Data: Confusion.  Altered mental status.  History of trigeminal neuralgia treated with surgical decompression (2009) and gamma knife.  MRI HEAD WITHOUT CONTRAST  Technique:  Multiplanar, multiecho pulse sequences of the brain and surrounding structures were obtained according to standard protocol without intravenous contrast.  Comparison: 08/24/2012 CT.  07/29/2011 MR.  Findings: The patient refused contrast and was moving throughout the examination.  No acute infarct.  Post left occipital craniotomy with encephalomalacia unchanged from the prior exam.  Opacification left mastoid air cells.  No mass seen at the level of posterior superior nasopharynx causing eustachian tube  dysfunction.  No hydrocephalus.  No intracranial hemorrhage.  No intracranial mass lesion noted on this unenhanced motion degraded exam.  Major intracranial vascular structures are patent.  IMPRESSION: The patient refused contrast and was moving throughout the examination.  No acute infarct.  Post left occipital craniotomy with left posterior cerebellar encephalomalacia unchanged from the prior exam.  Opacification left mastoid air cells/middle ear.  No mass seen at the level of posterior superior nasopharynx causing eustachian tube dysfunction.   Original Report Authenticated By: Fuller Canada, M.D.     Microbiology: Recent Results (from the past 240 hour(s))  CULTURE, BLOOD (ROUTINE X 2)     Status: Normal (Preliminary result)   Collection Time   08/24/12  5:14 PM      Component Value Range Status Comment   Specimen Description BLOOD LEFT ANTECUBITAL   Final    Special Requests BOTTLES DRAWN AEROBIC AND ANAEROBIC 12CC   Final    Culture NO GROWTH 2 DAYS   Final    Report Status PENDING   Incomplete   CULTURE, BLOOD (ROUTINE X 2)     Status: Normal (Preliminary result)   Collection Time   08/24/12  5:19 PM      Component Value Range Status Comment   Specimen Description BLOOD RIGHT HAND   Final    Special Requests BOTTLES DRAWN AEROBIC ONLY 6CC   Final    Culture NO GROWTH 2 DAYS   Final    Report Status PENDING   Incomplete      Labs: Basic Metabolic Panel:  Lab 08/26/12 1478 08/25/12 0504 08/24/12 1644  NA 139 144 143  K 3.7 3.5 3.5  CL 103 114* 107  CO2 24 21 26   GLUCOSE 98 80 93  BUN 5* 5* 4*  CREATININE 0.64 0.65 0.84  CALCIUM 8.7 8.6 9.1  MG -- -- --  PHOS -- -- --   Liver Function Tests:  Lab 08/25/12 0504 08/24/12 1644  AST 14 22  ALT 18 29  ALKPHOS 18* 27*  BILITOT 0.2* 0.1*  PROT 5.9* 7.0  ALBUMIN 2.8* 3.5     CBC:  Lab 08/26/12 0840 08/25/12 0504 08/24/12 1644  WBC 7.0 8.0 8.2  NEUTROABS -- -- --  HGB 13.7 12.3 12.7  HCT 39.0 36.1 37.1  MCV 96.3  98.4 98.4  PLT 341 308 349    CBG:  Lab 08/24/12 1651  GLUCAP 95       Signed:  Conchetta Lamia C  [facility] 08/26/2012, 10:01 AM

## 2012-08-30 LAB — BENZODIAZEPINE, QUANTITATIVE, URINE
Alprazolam (GC/LC/MS), ur confirm: 1451 ng/mL
Clonazepam metabolite (GC/LC/MS), ur confirm: NEGATIVE ng/mL
Estazolam (GC/LC/MS), ur confirm: NEGATIVE ng/mL
Flurazepam GC/MS Conf: NEGATIVE ng/mL
Lorazepam (GC/LC/MS), ur confirm: NEGATIVE ng/mL
Nordiazepam GC/MS Conf: NEGATIVE ng/mL
Temazepam GC/MS Conf: NEGATIVE ng/mL

## 2012-08-30 LAB — CULTURE, BLOOD (ROUTINE X 2): Culture: NO GROWTH

## 2012-10-13 ENCOUNTER — Encounter (HOSPITAL_COMMUNITY): Payer: Self-pay | Admitting: *Deleted

## 2012-10-13 ENCOUNTER — Emergency Department (HOSPITAL_COMMUNITY)
Admission: EM | Admit: 2012-10-13 | Discharge: 2012-10-13 | Disposition: A | Discharge status: Home / Self Care | Payer: BC Managed Care – PPO | Attending: Emergency Medicine | Admitting: Emergency Medicine

## 2012-10-13 DIAGNOSIS — Z7982 Long term (current) use of aspirin: Secondary | ICD-10-CM | POA: Insufficient documentation

## 2012-10-13 DIAGNOSIS — Z8673 Personal history of transient ischemic attack (TIA), and cerebral infarction without residual deficits: Secondary | ICD-10-CM | POA: Insufficient documentation

## 2012-10-13 DIAGNOSIS — E7800 Pure hypercholesterolemia, unspecified: Secondary | ICD-10-CM | POA: Insufficient documentation

## 2012-10-13 DIAGNOSIS — I10 Essential (primary) hypertension: Secondary | ICD-10-CM | POA: Insufficient documentation

## 2012-10-13 DIAGNOSIS — F410 Panic disorder [episodic paroxysmal anxiety] without agoraphobia: Secondary | ICD-10-CM | POA: Insufficient documentation

## 2012-10-13 DIAGNOSIS — F411 Generalized anxiety disorder: Secondary | ICD-10-CM | POA: Insufficient documentation

## 2012-10-13 DIAGNOSIS — G8929 Other chronic pain: Secondary | ICD-10-CM | POA: Insufficient documentation

## 2012-10-13 DIAGNOSIS — Z79899 Other long term (current) drug therapy: Secondary | ICD-10-CM | POA: Insufficient documentation

## 2012-10-13 LAB — RAPID URINE DRUG SCREEN, HOSP PERFORMED
Amphetamines: NOT DETECTED
Barbiturates: NOT DETECTED
Cocaine: NOT DETECTED
Opiates: NOT DETECTED
Tetrahydrocannabinol: NOT DETECTED

## 2012-10-13 LAB — CBC WITH DIFFERENTIAL/PLATELET
Basophils Absolute: 0 10*3/uL (ref 0.0–0.1)
Basophils Relative: 0 % (ref 0–1)
Eosinophils Absolute: 0 10*3/uL (ref 0.0–0.7)
Hemoglobin: 12.3 g/dL (ref 12.0–15.0)
MCH: 32.8 pg (ref 26.0–34.0)
MCHC: 34 g/dL (ref 30.0–36.0)
Monocytes Absolute: 0.1 10*3/uL (ref 0.1–1.0)
Monocytes Relative: 1 % — ABNORMAL LOW (ref 3–12)
Neutro Abs: 8.4 10*3/uL — ABNORMAL HIGH (ref 1.7–7.7)
Neutrophils Relative %: 90 % — ABNORMAL HIGH (ref 43–77)
RDW: 12.6 % (ref 11.5–15.5)

## 2012-10-13 LAB — COMPREHENSIVE METABOLIC PANEL
AST: 16 U/L (ref 0–37)
Albumin: 3.8 g/dL (ref 3.5–5.2)
BUN: 10 mg/dL (ref 6–23)
Chloride: 101 mEq/L (ref 96–112)
Creatinine, Ser: 0.62 mg/dL (ref 0.50–1.10)
Potassium: 3.8 mEq/L (ref 3.5–5.1)
Total Protein: 7.3 g/dL (ref 6.0–8.3)

## 2012-10-13 MED ORDER — HALOPERIDOL LACTATE 5 MG/ML IJ SOLN
INTRAMUSCULAR | Status: AC
  Administered 2012-10-13: 10 mg via INTRAMUSCULAR
  Filled 2012-10-13: qty 2

## 2012-10-13 MED ORDER — HALOPERIDOL LACTATE 5 MG/ML IJ SOLN
10.0000 mg | Freq: Once | INTRAMUSCULAR | Status: AC
  Administered 2012-10-13 (×2): 10 mg via INTRAMUSCULAR

## 2012-10-13 MED ORDER — HALOPERIDOL LACTATE 5 MG/ML IJ SOLN
INTRAMUSCULAR | Status: AC
  Filled 2012-10-13: qty 2

## 2012-10-13 MED ORDER — HALOPERIDOL LACTATE 5 MG/ML IJ SOLN: 10.0000 mg | Freq: Once | INTRAMUSCULAR | Status: DC

## 2012-10-13 MED ORDER — LORAZEPAM 2 MG/ML IJ SOLN
2.0000 mg | Freq: Once | INTRAMUSCULAR | Status: AC
  Administered 2012-10-13: 2 mg via INTRAMUSCULAR
  Filled 2012-10-13: qty 1

## 2012-10-13 NOTE — ED Notes (Signed)
[employer] Deputy is sitting in room or just outside of room now and ACT counselor in in room talking with patient and her spouse.  Will round on patient when she has completed her assessment.

## 2012-10-13 NOTE — ED Notes (Addendum)
Sent from Dr Ronal Fear office,  Shouting,"don't touch me".  Husband with her.  Husband says "reaction to medication" Was in MD office for injection for trigeminal neuralgia.

## 2012-10-13 NOTE — ED Provider Notes (Signed)
History     CSN: [id number]  Arrival date & time 10/13/12  1629   First MD Initiated Contact with Patient 10/13/12 1650    level 5  Chief Complaint  Patient presents with  . Medication Reaction    (Consider location/radiation/quality/duration/timing/severity/associated sxs/prior treatment) HPI Patient presents to ed yelling and screaming.  I was first aware of patient due to yelling and verbally aggressive noise from triage.  Patient screaming that no one was listening to her.  I came to the triage room and patient agitated and refused to answer questions about why she was here.  Female friend stated she is having a medication reaction.  She refused to answer questions continuing to yell and state no one will listen to her.   Past Medical History  Diagnosis Date  . Stroke   . Hypertension   . Anxiety   . Panic attacks   . Trigeminal neuralgia   . Hypercholesteremia     Past Surgical History  Procedure Date  . Brain surgery   . Appendectomy   . Tonsillectomy     History reviewed. No pertinent family history.  History  Substance Use Topics  . Smoking status: Never Smoker   . Smokeless tobacco: Never Used  . Alcohol Use: Yes     Comment: occ    OB History    Grav Para Term Preterm Abortions TAB SAB Ect Mult Living                  Review of Systems  Unable to perform ROS   Allergies  Review of patient's allergies indicates no known allergies.  Home Medications   Current Outpatient Rx  Name  Route  Sig  Dispense  Refill  . ALPRAZOLAM 1 MG PO TABS   Oral   Take 1 mg by mouth every 6 (six) hours as needed. Nerves/anxiety         . ASPIRIN EC 81 MG PO TBEC   Oral   Take 81 mg by mouth daily.           Marland Kitchen BACLOFEN 10 MG PO TABS   Oral   Take 10 mg by mouth 3 (three) times daily.           Marland Kitchen VITAMIN B-12 PO   Oral   Take 1 tablet by mouth daily.         Marland Kitchen GABAPENTIN ENACARBIL ER 600 MG PO TB24   Oral   Take 600 mg by mouth every 12 (twelve)  hours.         . IBUPROFEN 800 MG PO TABS   Oral   Take 800 mg by mouth 3 (three) times daily.          Marland Kitchen LISINOPRIL-HYDROCHLOROTHIAZIDE 20-25 MG PO TABS   Oral   Take 1 tablet by mouth daily.         . OXYCODONE HCL ER 15 MG PO TB12   Oral   Take 15 mg by mouth every 12 (twelve) hours.         Jeananne Rama SULFATE 0.05-0.25 % OP SOLN   Both Eyes   Place 2 drops into both eyes daily as needed. Dry Eyes         . VITAMIN D (CHOLECALCIFEROL) PO   Oral   Take 1 tablet by mouth daily.         Marland Kitchen VITAMIN E PO   Oral   Take 1 tablet by mouth daily.         Marland Kitchen  ZOLPIDEM TARTRATE 10 MG PO TABS   Oral   Take 10 mg by mouth at bedtime as needed. Sleep           BP 157/111  Pulse 99  Temp 98.1 F (36.7 C) (Oral)  Ht 5\' 2"  (1.575 m)  Wt 165 lb (74.844 kg)  BMI 30.18 kg/m2  SpO2 100%  LMP 09/13/2012  Physical Exam  Nursing note and vitals reviewed. Constitutional: She is oriented to person, place, and time. She appears well-developed and well-nourished.  HENT:  Head: Normocephalic and atraumatic.  Eyes: Conjunctivae normal are normal. Pupils are equal, round, and reactive to light.  Neck: Normal range of motion. Neck supple.  Cardiovascular: Normal rate and regular rhythm.   Pulmonary/Chest: Effort normal and breath sounds normal.  Abdominal: Soft. Bowel sounds are normal.  Musculoskeletal: Normal range of motion.       Abrasion right forearm  Neurological: She is alert and oriented to person, place, and time.       Patient ambulatory without difficulty.    Skin: Skin is warm and dry.  Psychiatric: Her speech is normal. Her affect is angry, labile and inappropriate. She is agitated, aggressive and combative. Thought content is paranoid. She expresses impulsivity and inappropriate judgment. She expresses no homicidal and no suicidal ideation. She expresses no suicidal plans and no homicidal plans.    ED Course  Procedures (including critical care  time)  Labs Reviewed  CBC WITH DIFFERENTIAL - Abnormal; Notable for the following:    RBC 3.75 (*)     Neutrophils Relative 90 (*)     Neutro Abs 8.4 (*)     Lymphocytes Relative 9 (*)     Monocytes Relative 1 (*)     All other components within normal limits  COMPREHENSIVE METABOLIC PANEL - Abnormal; Notable for the following:    Glucose, Bld 139 (*)     Alkaline Phosphatase 21 (*)     Total Bilirubin 0.2 (*)     All other components within normal limits  URINE RAPID DRUG SCREEN (HOSP PERFORMED) - Abnormal; Notable for the following:    Benzodiazepines POSITIVE (*)     All other components within normal limits  ETHANOL   No results found.   No diagnosis found.    MDM  Patient reevaluated in room 2 and haldol 10 mg im ordered.  Patient continued to yell and agitated.  Patient given meds by rn.   Patient moved to room 16 prior to my next evaluation.  Asked  By rn if patient could eat.  Patient had not been calm enough prior to this to consider po.  I went to reevaluate and patient being fed by bf.  Patient reports she has trigeminal neuralgia.  Does not answer any questions except that abrasion on forearm is from fall.  She did allow exam at this time.  I did not attempt to palpate face due to pain.  Patient eating.  Patient given additional haldol 10 mg im She then decided to leave but has not answered any questions.  I signed commitment papers to obtain psych evaluation.  Patient given im ativan 2mg .   Patient assessed by act and complaining of pain in face.  I reviewed patient's record from [facility] via [facility].  Patient treated there for trigeminal neuralgia.   ACT does not think patient homicidal, suicidal, or psychotic after her evaluation.  Plan to rescind papers and allow patient to leave with bf.  I discussed with patient  and boyfriend all the above. She currently states that she is not homicidal, suicidal, and not hearing voices or seeing things. She continues to yell and  escalate easily. She does have pain medicine at home. Boyfriend is advised to control the narcotics but to use them as prescribed for her pain. They're advised that she is worse anytime she should be reevaluated. She voices wished to be discharged. She is not currently meet any commitment criteria. She has no complaints beyond her usual trigeminal neuralgia pain. She denies any fever or change in her headache. She does have the abrasion to the right forearm which she states occurred when she was going to the neighbors house because she could not find her cell phone. She states her tetanus shot is up-to-date.     CRITICAL CARE Performed by: Hilario Quarry   Total critical care time: 60  Critical care time was exclusive of separately billable procedures and treating other patients.  Critical care was necessary to treat or prevent imminent or life-threatening deterioration.  Critical care was time spent personally by me on the following activities: development of treatment plan with patient and/or surrogate as well as nursing, discussions with consultants, evaluation of patient's response to treatment, examination of patient, obtaining history from patient or surrogate, ordering and performing treatments and interventions, ordering and review of laboratory studies, ordering and review of radiographic studies, pulse oximetry and re-evaluation of patient's condition. 6  Hilario Quarry, MD 10/13/12 2045

## 2012-10-13 NOTE — ED Notes (Signed)
Pt slightly more calm, still gets agitated easily, cooperative with blood draw.  Family and sitter at bedside.

## 2012-10-13 NOTE — ED Notes (Signed)
Pt became agitated, wanting to leave, refusing to get undressed.  Calling staff names, walked out of room, walking down hallway and stopped by security.  Pt escorted back to room.  edp notified and IVC papers completed.  [employer] called to sit with pt.  [employer] at bedside at this time.

## 2012-10-13 NOTE — ED Notes (Signed)
ACT counselor discussing patient with Dr Rosalia Hammers.  Awaiting additional orders

## 2012-10-13 NOTE — ED Notes (Signed)
Patient screaming obscenities, being very beligerent.  W/husband's assist was able to give Haldol IM.
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
## 2012-11-03 ENCOUNTER — Encounter (HOSPITAL_COMMUNITY): Payer: Self-pay | Admitting: Psychiatry

## 2012-11-03 ENCOUNTER — Ambulatory Visit (INDEPENDENT_AMBULATORY_CARE_PROVIDER_SITE_OTHER): Payer: BC Managed Care – PPO | Admitting: Psychiatry

## 2012-11-03 VITALS — Wt 165.4 lb

## 2012-11-03 DIAGNOSIS — F329 Major depressive disorder, single episode, unspecified: Secondary | ICD-10-CM

## 2012-11-03 DIAGNOSIS — F0630 Mood disorder due to known physiological condition, unspecified: Secondary | ICD-10-CM

## 2012-11-03 DIAGNOSIS — F3289 Other specified depressive episodes: Secondary | ICD-10-CM

## 2012-11-03 MED ORDER — AMITRIPTYLINE HCL 10 MG PO TABS: ORAL_TABLET | ORAL | Status: DC
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
## 2012-11-06 ENCOUNTER — Encounter (HOSPITAL_COMMUNITY): Payer: Self-pay | Admitting: Psychiatry

## 2012-11-06 ENCOUNTER — Ambulatory Visit (INDEPENDENT_AMBULATORY_CARE_PROVIDER_SITE_OTHER): Payer: BC Managed Care – PPO | Admitting: Psychiatry

## 2012-11-06 DIAGNOSIS — F329 Major depressive disorder, single episode, unspecified: Secondary | ICD-10-CM

## 2012-11-06 NOTE — Patient Instructions (Signed)
Discussed orally 
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
## 2012-11-17 ENCOUNTER — Ambulatory Visit (HOSPITAL_COMMUNITY): Payer: Self-pay | Admitting: Psychiatry

## 2012-11-23 ENCOUNTER — Ambulatory Visit (INDEPENDENT_AMBULATORY_CARE_PROVIDER_SITE_OTHER): Payer: BC Managed Care – PPO | Admitting: Psychiatry

## 2012-11-23 ENCOUNTER — Encounter (HOSPITAL_COMMUNITY): Payer: Self-pay | Admitting: Psychiatry

## 2012-11-23 VITALS — Wt 169.0 lb

## 2012-11-23 DIAGNOSIS — F329 Major depressive disorder, single episode, unspecified: Secondary | ICD-10-CM

## 2012-11-23 DIAGNOSIS — F0630 Mood disorder due to known physiological condition, unspecified: Secondary | ICD-10-CM

## 2012-11-23 MED ORDER — AMITRIPTYLINE HCL 25 MG PO TABS: 25.0000 mg | ORAL_TABLET | Freq: Every day | ORAL | Status: DC
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
## 2012-12-01 ENCOUNTER — Inpatient Hospital Stay (HOSPITAL_COMMUNITY)
Admission: EM | Admit: 2012-12-01 | Discharge: 2012-12-03 | DRG: 300 | Disposition: A | Discharge status: Home / Self Care | Payer: BC Managed Care – PPO | Attending: Internal Medicine | Admitting: Internal Medicine

## 2012-12-01 ENCOUNTER — Encounter (HOSPITAL_COMMUNITY): Payer: Self-pay | Admitting: *Deleted

## 2012-12-01 ENCOUNTER — Emergency Department (HOSPITAL_COMMUNITY): Payer: BC Managed Care – PPO

## 2012-12-01 DIAGNOSIS — F329 Major depressive disorder, single episode, unspecified: Secondary | ICD-10-CM | POA: Diagnosis present

## 2012-12-01 DIAGNOSIS — G500 Trigeminal neuralgia: Secondary | ICD-10-CM | POA: Diagnosis present

## 2012-12-01 DIAGNOSIS — Z8673 Personal history of transient ischemic attack (TIA), and cerebral infarction without residual deficits: Secondary | ICD-10-CM

## 2012-12-01 DIAGNOSIS — E785 Hyperlipidemia, unspecified: Secondary | ICD-10-CM

## 2012-12-01 DIAGNOSIS — E872 Acidosis, unspecified: Secondary | ICD-10-CM | POA: Diagnosis present

## 2012-12-01 DIAGNOSIS — R569 Unspecified convulsions: Secondary | ICD-10-CM | POA: Diagnosis present

## 2012-12-01 DIAGNOSIS — H60399 Other infective otitis externa, unspecified ear: Secondary | ICD-10-CM

## 2012-12-01 DIAGNOSIS — I10 Essential (primary) hypertension: Secondary | ICD-10-CM | POA: Diagnosis present

## 2012-12-01 DIAGNOSIS — T380X5A Adverse effect of glucocorticoids and synthetic analogues, initial encounter: Secondary | ICD-10-CM | POA: Diagnosis present

## 2012-12-01 DIAGNOSIS — E222 Syndrome of inappropriate secretion of antidiuretic hormone: Secondary | ICD-10-CM | POA: Diagnosis present

## 2012-12-01 DIAGNOSIS — R270 Ataxia, unspecified: Secondary | ICD-10-CM

## 2012-12-01 DIAGNOSIS — E878 Other disorders of electrolyte and fluid balance, not elsewhere classified: Secondary | ICD-10-CM

## 2012-12-01 DIAGNOSIS — E876 Hypokalemia: Secondary | ICD-10-CM | POA: Diagnosis present

## 2012-12-01 DIAGNOSIS — F419 Anxiety disorder, unspecified: Secondary | ICD-10-CM | POA: Diagnosis present

## 2012-12-01 DIAGNOSIS — F3289 Other specified depressive episodes: Secondary | ICD-10-CM | POA: Diagnosis present

## 2012-12-01 DIAGNOSIS — F410 Panic disorder [episodic paroxysmal anxiety] without agoraphobia: Secondary | ICD-10-CM | POA: Diagnosis present

## 2012-12-01 DIAGNOSIS — F411 Generalized anxiety disorder: Secondary | ICD-10-CM | POA: Diagnosis present

## 2012-12-01 DIAGNOSIS — E7800 Pure hypercholesterolemia, unspecified: Secondary | ICD-10-CM | POA: Diagnosis present

## 2012-12-01 DIAGNOSIS — E871 Hypo-osmolality and hyponatremia: Secondary | ICD-10-CM

## 2012-12-01 DIAGNOSIS — H6092 Unspecified otitis externa, left ear: Secondary | ICD-10-CM | POA: Clinically undetermined

## 2012-12-01 DIAGNOSIS — B370 Candidal stomatitis: Secondary | ICD-10-CM | POA: Diagnosis not present

## 2012-12-01 DIAGNOSIS — E236 Other disorders of pituitary gland: Principal | ICD-10-CM | POA: Diagnosis present

## 2012-12-01 LAB — CBC WITH DIFFERENTIAL/PLATELET
HCT: 32.4 % — ABNORMAL LOW (ref 36.0–46.0)
Hemoglobin: 12.2 g/dL (ref 12.0–15.0)
Lymphocytes Relative: 9 % — ABNORMAL LOW (ref 12–46)
Lymphs Abs: 1.8 10*3/uL (ref 0.7–4.0)
MCHC: 37.7 g/dL — ABNORMAL HIGH (ref 30.0–36.0)
Monocytes Absolute: 1.3 10*3/uL — ABNORMAL HIGH (ref 0.1–1.0)
Monocytes Relative: 7 % (ref 3–12)
Neutro Abs: 16.3 10*3/uL — ABNORMAL HIGH (ref 1.7–7.7)
Neutrophils Relative %: 84 % — ABNORMAL HIGH (ref 43–77)
RBC: 3.77 MIL/uL — ABNORMAL LOW (ref 3.87–5.11)

## 2012-12-01 LAB — BASIC METABOLIC PANEL
BUN: 6 mg/dL (ref 6–23)
BUN: 5 mg/dL — ABNORMAL LOW (ref 6–23)
CO2: 21 mEq/L (ref 19–32)
Chloride: 70 mEq/L — ABNORMAL LOW (ref 96–112)
Chloride: 79 mEq/L — ABNORMAL LOW (ref 96–112)
Chloride: 84 mEq/L — ABNORMAL LOW (ref 96–112)
Creatinine, Ser: 0.54 mg/dL (ref 0.50–1.10)
GFR calc Af Amer: >90 mL/min (ref >90)
GFR calc Af Amer: >90 mL/min (ref >90)
GFR calc Af Amer: >90 mL/min (ref >90)
GFR calc non Af Amer: >90 mL/min (ref >90)
GFR calc non Af Amer: >90 mL/min (ref >90)
Potassium: 2.8 mEq/L — ABNORMAL LOW (ref 3.5–5.1)
Potassium: 2.8 mEq/L — ABNORMAL LOW (ref 3.5–5.1)
Potassium: 3 mEq/L — ABNORMAL LOW (ref 3.5–5.1)
Sodium: 112 mEq/L — CL (ref 135–145)

## 2012-12-01 LAB — COMPREHENSIVE METABOLIC PANEL
Alkaline Phosphatase: 19 U/L — ABNORMAL LOW (ref 39–117)
BUN: 7 mg/dL (ref 6–23)
CO2: 16 mEq/L — ABNORMAL LOW (ref 19–32)
Chloride: 67 mEq/L — ABNORMAL LOW (ref 96–112)
Creatinine, Ser: 0.59 mg/dL (ref 0.50–1.10)
GFR calc non Af Amer: >90 mL/min (ref >90)
Glucose, Bld: 143 mg/dL — ABNORMAL HIGH (ref 70–99)
Potassium: 3 mEq/L — ABNORMAL LOW (ref 3.5–5.1)
Total Bilirubin: 0.8 mg/dL (ref 0.3–1.2)

## 2012-12-01 LAB — URINALYSIS, ROUTINE W REFLEX MICROSCOPIC
Glucose, UA: NEGATIVE mg/dL
Ketones, ur: NEGATIVE mg/dL
Leukocytes, UA: NEGATIVE
Protein, ur: NEGATIVE mg/dL
Urobilinogen, UA: 0.2 mg/dL (ref 0.0–1.0)

## 2012-12-01 LAB — URINE MICROSCOPIC-ADD ON

## 2012-12-01 LAB — TROPONIN I: Troponin I: <0.3 ng/mL (ref <0.30)

## 2012-12-01 LAB — RAPID URINE DRUG SCREEN, HOSP PERFORMED
Amphetamines: NOT DETECTED
Opiates: NOT DETECTED
Tetrahydrocannabinol: NOT DETECTED

## 2012-12-01 LAB — PHOSPHORUS: Phosphorus: 2.1 mg/dL — ABNORMAL LOW (ref 2.3–4.6)

## 2012-12-01 LAB — GLUCOSE, CAPILLARY: Glucose-Capillary: 175 mg/dL — ABNORMAL HIGH (ref 70–99)

## 2012-12-01 LAB — MAGNESIUM: Magnesium: 1.5 mg/dL (ref 1.5–2.5)

## 2012-12-01 MED ORDER — SODIUM CHLORIDE 3 % IV SOLN
INTRAVENOUS | Status: AC
  Administered 2012-12-01: 14:00:00 via INTRAVENOUS
  Filled 2012-12-01 (×3): qty 500

## 2012-12-01 MED ORDER — SODIUM CHLORIDE 0.9 % IJ SOLN
3.0000 mL | Freq: Two times a day (BID) | INTRAMUSCULAR | Status: DC
  Administered 2012-12-01 – 2012-12-03 (×3): 3 mL via INTRAVENOUS
  Filled 2012-12-01: qty 3

## 2012-12-01 MED ORDER — ACETAMINOPHEN 650 MG RE SUPP: 650.0000 mg | Freq: Four times a day (QID) | RECTAL | Status: DC | PRN

## 2012-12-01 MED ORDER — ACETAMINOPHEN 325 MG PO TABS
650.0000 mg | ORAL_TABLET | Freq: Four times a day (QID) | ORAL | Status: DC | PRN
  Administered 2012-12-01 – 2012-12-03 (×3): 650 mg via ORAL
  Filled 2012-12-01 (×3): qty 2

## 2012-12-01 MED ORDER — SODIUM CHLORIDE 0.9 % IV BOLUS (SEPSIS)
1000.0000 mL | Freq: Once | INTRAVENOUS | Status: AC
  Administered 2012-12-01: 1000 mL via INTRAVENOUS

## 2012-12-01 MED ORDER — ONDANSETRON HCL 4 MG/2ML IJ SOLN
4.0000 mg | Freq: Four times a day (QID) | INTRAMUSCULAR | Status: DC | PRN
  Administered 2012-12-02: 4 mg via INTRAVENOUS

## 2012-12-01 MED ORDER — LORAZEPAM 2 MG/ML IJ SOLN
1.0000 mg | Freq: Once | INTRAMUSCULAR | Status: AC
  Administered 2012-12-01: 1 mg via INTRAVENOUS

## 2012-12-01 MED ORDER — POTASSIUM CHLORIDE CRYS ER 20 MEQ PO TBCR: 40.0000 meq | EXTENDED_RELEASE_TABLET | Freq: Once | ORAL | Status: DC

## 2012-12-01 MED ORDER — ONDANSETRON HCL 4 MG PO TABS: 4.0000 mg | ORAL_TABLET | Freq: Four times a day (QID) | ORAL | Status: DC | PRN

## 2012-12-01 MED ORDER — LORAZEPAM 2 MG/ML IJ SOLN
1.0000 mg | INTRAMUSCULAR | Status: DC | PRN
  Administered 2012-12-02: 1 mg via INTRAVENOUS
  Filled 2012-12-01: qty 1

## 2012-12-01 MED ORDER — DIPHENHYDRAMINE HCL 50 MG/ML IJ SOLN
25.0000 mg | Freq: Once | INTRAMUSCULAR | Status: AC
  Administered 2012-12-01: 25 mg via INTRAVENOUS
  Filled 2012-12-01: qty 1

## 2012-12-01 MED ORDER — LORAZEPAM 2 MG/ML IJ SOLN
INTRAMUSCULAR | Status: AC
  Administered 2012-12-01: 1 mg via INTRAVENOUS
  Filled 2012-12-01: qty 1

## 2012-12-01 MED ORDER — POTASSIUM CHLORIDE 10 MEQ/100ML IV SOLN
10.0000 meq | INTRAVENOUS | Status: AC
  Administered 2012-12-01 (×3): 10 meq via INTRAVENOUS
  Filled 2012-12-01: qty 300

## 2012-12-01 MED ORDER — POTASSIUM CHLORIDE 10 MEQ/100ML IV SOLN: 10.0000 meq | INTRAVENOUS | Status: DC

## 2012-12-01 NOTE — Progress Notes (Signed)
Notified Dr. Rito Ehrlich regarding patient's bottom swelling more within last hour. Also patient complaining of left side of throat hurting. Orders for benadryl to follow.

## 2012-12-01 NOTE — ED Notes (Signed)
Pt reports was at Dr. Ronal Fear office for an office visit.  Father reports pt was standing at registration desk, jerked, fell in floor, and had a seizure.  Says the seizure lasted for about 4 or 5 minutes.  Pt awake but confused.  Oriented to self, time, and place but not events.  Pt was incontinent of urine prior to arrival.  Patient has bruise and abrasion to top of left foot and c/o pain.  Also c/o headache.  Pupils reactive and equal.  Obeys commands.  C/O generalized weakness.

## 2012-12-01 NOTE — ED Notes (Signed)
Pt had seizure, bit bottom lip, small amount of blood noted.  Pt was incontinent of urine again and is more confused now.  EDP at bedside.  Ativan given.  Pt alert but very disoriented.  Undressed pt and noticed pt has bruise to left lower thigh and abrasion and bruise to r knee.

## 2012-12-01 NOTE — ED Notes (Signed)
Seizure pads on bed, pt on telemetry, father at bedside.

## 2012-12-01 NOTE — ED Notes (Signed)
CRITICAL VALUE ALERT  Critical value received:  Sodium 103  Date of notification:  12/01/2012  Time of notification:  1312  Critical value read back:yes  Nurse who received alert:  Tarri Glenn RN  MD notified (1st page):  Dr Lynelle Doctor  Time of first page:  1312  MD notified (2nd page):  Time of second page:  Responding MD:  Dr Lynelle Doctor  Time MD responded:  [phone]

## 2012-12-01 NOTE — ED Provider Notes (Signed)
History   This chart was scribed for Ward Givens, MD by Charolett Bumpers, ED Scribe. The patient was seen in room APA08/APA08. Patient's care was started at 1147.   CSN: [id number]  Arrival date & time 12/01/12  1135   First MD Initiated Contact with Patient 12/01/12 1147      Chief Complaint  Patient presents with  . Seizures   Level V Caveat: Altered mental status, unresponsive.   The history is provided by the patient. No language interpreter was used.  Mariah Ibarra is a 42 y.o. female who presents to the Emergency Department complaining of sudden onset seizure that occurred PTA. She was at Dr. Ronal Fear this morning receiving injections for trigeminal neuralgia when she had the seizure. Per father, pt fell to the floor and had 3-4 minutes of jerking. He reports she gradually became coherent after 7-8 minutes, was able to talk but was still confused. FOP drove her to the ED, Pt had another seizure in the ED with jerking and bit her lip. She was incontinent of urine with both seizures. Father reports the pt complained of dizziness prior to doctor's appointment and was uncoordinated with staggering gait which is not usual for her. He reports she was fine this past weekend. Father states that the boyfriend has witnessed seizures in the past but not this severe and is unable to describe them. He reports the pt started Tramadol 2-3 weeks ago by her psychiatrist. He also states she started taking Penicillin and steroid after dental work yesterday.   PCP none  Past Medical History  Diagnosis Date  . Stroke   . Hypertension   . Anxiety   . Panic attacks   . Trigeminal neuralgia   . Hypercholesteremia     Past Surgical History  Procedure Date  . Appendectomy   . Tonsillectomy   . Gamma knife procedure november 2013   . Brain surgery     Family History  Problem Relation Age of Onset  . Depression Paternal Aunt   . Depression Paternal Uncle   . Depression Maternal  Grandfather     History  Substance Use Topics  . Smoking status: Never Smoker   . Smokeless tobacco: Never Used  . Alcohol Use: No     Comment: occ  He denies any tobacco or alcohol use.  Out on medical leave for trigeminal neuralgia for the past 5 months (works at [employer]) Lives with boyfriend.   Review of Systems  Unable to perform ROS: Mental status change  Pt is unresponsive, seizures.  Allergies  Gabapentin  Home Medications   Current Outpatient Rx  Name  Route  Sig  Dispense  Refill  . ALPRAZOLAM 1 MG PO TABS   Oral   Take 1 mg by mouth every 6 (six) hours as needed. Nerves/anxiety         . AMITRIPTYLINE HCL 25 MG PO TABS   Oral   Take 1 tablet (25 mg total) by mouth at bedtime.   30 tablet   1   . BACLOFEN 10 MG PO TABS   Oral   Take 10 mg by mouth 3 (three) times daily.           Marland Kitchen GABAPENTIN 800 MG PO TABS   Oral   Take 800 mg by mouth 3 (three) times daily.         . IBUPROFEN 800 MG PO TABS   Oral   Take 800 mg by mouth 3 (three) times  daily.          Marland Kitchen LISINOPRIL-HYDROCHLOROTHIAZIDE 20-25 MG PO TABS   Oral   Take 1 tablet by mouth daily.         . OXYCODONE HCL ER 15 MG PO TB12   Oral   Take 15 mg by mouth every 12 (twelve) hours.         Jeananne Rama SULFATE 0.05-0.25 % OP SOLN   Both Eyes   Place 2 drops into both eyes daily as needed. Dry Eyes         . VITAMIN D (CHOLECALCIFEROL) PO   Oral   Take 1 tablet by mouth daily.         Marland Kitchen VITAMIN E PO   Oral   Take 1 tablet by mouth daily.         Marland Kitchen ZOLPIDEM TARTRATE 10 MG PO TABS   Oral   Take 10 mg by mouth at bedtime as needed. Sleep           Triage Vitals: BP 135/102  Pulse 92  Temp 97.6 F (36.4 C) (Oral)  Resp 18  Ht 5\' 2"  (1.575 m)  Wt 165 lb (74.844 kg)  BMI 30.18 kg/m2  SpO2 100%  LMP 11/01/2012  Vital signs normal    Physical Exam  Nursing note and vitals reviewed. Constitutional: She appears well-developed and well-nourished. No  distress.       Unresponsive   HENT:  Head: Normocephalic.  Right Ear: External ear normal.  Left Ear: External ear normal.  Nose: Nose normal.  Mouth/Throat: Oropharynx is clear and moist. No oropharyngeal exudate.       Blood on lip without obvious laceration. She does have some swelling of the lower lip.  Eyes: Conjunctivae normal and EOM are normal. Pupils are equal, round, and reactive to light.  Neck: Neck supple. No tracheal deviation present.  Cardiovascular: Normal rate, regular rhythm and normal heart sounds.  Exam reveals no gallop and no friction rub.   No murmur heard. Pulmonary/Chest: Effort normal and breath sounds normal. No respiratory distress. She has no wheezes. She has no rhonchi. She has no rales.       Heavy breathing consistent with postictal state  Abdominal: Soft. Bowel sounds are normal. She exhibits no distension. There is no tenderness. There is no rebound and no guarding.  Genitourinary:       Incontinent of urine.   Musculoskeletal: Normal range of motion.  Neurological:       Does not follow commands  Skin: Skin is warm and dry.       Bruising on the dorsum of the left foot on the lateral aspect. Also has a bruise on her left knee and her thigh most likely from her fall with her first seizure today  Psychiatric: She has a normal mood and affect. Her behavior is normal.    ED Course  Procedures (including critical care time)   Medications  sodium chloride (hypertonic) 3 % solution (  Intravenous New Bag/Given 12/01/12 1353)  potassium chloride SA (K-DUR,KLOR-CON) CR tablet 40 mEq (not administered)  potassium chloride 10 mEq in 100 mL IVPB (not administered)  LORazepam (ATIVAN) injection 1 mg (1 mg Intravenous Given by Other 12/01/12 1215)  sodium chloride 0.9 % bolus 1,000 mL (1000 mL Intravenous New Bag/Given 12/01/12 1348)     DIAGNOSTIC STUDIES: Oxygen Saturation is 100% on room air, normal by my interpretation.    COORDINATION OF  CARE:  12:14-Discussed planned course of  treatment with the father including a CT scan of her head, x-ray of left foot, blood work and UA, who is agreeable at this time. Patient given Ativan at the time of my exam although her seizure had resolved. She has had 2 seizures within a fairly short period of time.  Father had initially thought she was on tramadol however she was started by amitriptyline by her psychiatrist. After her second seizure patient did wake up and respond appropriately. She states she felt fine this morning when she went to Dr. Ronal Fear office. Her father now states she was having problems picking up things and he was constantly having to pick things up but she had dropped. She states she's been having trouble with her tooth and has not been eating and drinking well because of it.  Patient noted to have severe hyponatremia which is new since her ED visit on December 13. She was started on amitriptyline for about 3 weeks ago. Because of having 2 seizures today she was started on 3% normal saline to correct her hyponatremia. She also was given normal saline for her apparent dehydration. She also was started on potassium supplement.  Results for orders placed during the hospital encounter of 12/01/12  CBC WITH DIFFERENTIAL      Component Value Range   WBC 19.5 (*) 4.0 - 10.5 K/uL   RBC 3.77 (*) 3.87 - 5.11 MIL/uL   Hemoglobin 12.2  12.0 - 15.0 g/dL   HCT 40.9 (*) 81.1 - 91.4 %   MCV 85.9  78.0 - 100.0 fL   MCH 32.4  26.0 - 34.0 pg   MCHC 37.7 (*) 30.0 - 36.0 g/dL   RDW 78.2  95.6 - 21.3 %   Platelets 441 (*) 150 - 400 K/uL   Neutrophils Relative 84 (*) 43 - 77 %   Neutro Abs 16.3 (*) 1.7 - 7.7 K/uL   Lymphocytes Relative 9 (*) 12 - 46 %   Lymphs Abs 1.8  0.7 - 4.0 K/uL   Monocytes Relative 7  3 - 12 %   Monocytes Absolute 1.3 (*) 0.1 - 1.0 K/uL   Eosinophils Relative 0  0 - 5 %   Eosinophils Absolute 0.0  0.0 - 0.7 K/uL   Basophils Relative 0  0 - 1 %   Basophils  Absolute 0.0  0.0 - 0.1 K/uL  COMPREHENSIVE METABOLIC PANEL      Component Value Range   Sodium 103 (*) 135 - 145 mEq/L   Potassium 3.0 (*) 3.5 - 5.1 mEq/L   Chloride 67 (*) 96 - 112 mEq/L   CO2 16 (*) 19 - 32 mEq/L   Glucose, Bld 143 (*) 70 - 99 mg/dL   BUN 7  6 - 23 mg/dL   Creatinine, Ser 0.86  0.50 - 1.10 mg/dL   Calcium 8.9  8.4 - 57.8 mg/dL   Total Protein 7.5  6.0 - 8.3 g/dL   Albumin 4.3  3.5 - 5.2 g/dL   AST 37  0 - 37 U/L   ALT 12  0 - 35 U/L   Alkaline Phosphatase 19 (*) 39 - 117 U/L   Total Bilirubin 0.8  0.3 - 1.2 mg/dL   GFR calc non Af Amer >90  >90 mL/min   GFR calc Af Amer >90  >90 mL/min  MAGNESIUM      Component Value Range   Magnesium 1.5  1.5 - 2.5 mg/dL  TROPONIN I      Component Value Range  Troponin I <0.30  <0.30 ng/mL  GLUCOSE, CAPILLARY      Component Value Range   Glucose-Capillary 175 (*) 70 - 99 mg/dL   Laboratory interpretation all normal except severe hyponatremia, metabolic acidosis c/w 2 seizures close together, hypokalemia, low chloride, leukocytosis   Ct Head Wo Contrast  12/01/2012  *RADIOLOGY REPORT*  Clinical Data: Seizures  CT HEAD WITHOUT CONTRAST  Technique:  Contiguous axial images were obtained from the base of the skull through the vertex without contrast. Study was obtained within 24 hours of patient arrival at the emergency department.  Comparison:  Brain CT August 24, 2012 and brain MRI October 25- 1013  Findings:  The patient has had a previous craniotomy in the inferior left occipital region, stable.  Mild cerebellar atrophy on the left is stable.  The ventricles are normal in size and duration.  There is no apparent mass, hemorrhage, extra-axial fluid collection, or midline shift.  Gray-white compartments appears stable compared to prior studies without new gray-white compartment lesions. No evidence suggesting acute infarct.  No new bony lesions are identified.  Mastoid air cells are clear bilaterally.  IMPRESSION: Stable study.   Postoperative change in the left posterior fossa region, stable.  No evidence of acute appearing infarct, mass, or hemorrhage.  No new gray-white compartment lesions.   Original Report Authenticated By: Bretta Bang, M.D.    Dg Foot Complete Left  12/01/2012  *RADIOLOGY REPORT*  Clinical Data: Pain post trauma  LEFT FOOT - COMPLETE 3+ VIEW  Comparison: None.  Findings:  Frontal, oblique, and lateral views were obtained. There is no fracture or dislocation.  Joint spaces appear intact. No erosive change.  IMPRESSION: No abnormality noted.   Original Report Authenticated By: Bretta Bang, M.D.     Date: 12/01/2012  Rate: 91  Rhythm: normal sinus rhythm  QRS Axis: normal  Intervals: normal  ST/T Wave abnormalities: normal  Conduction Disutrbances:none  Narrative Interpretation:   Old EKG Reviewed: unchanged from 08/25/2012    1. Seizures   2. Hyponatremia   3. Metabolic acidosis   4. Hypokalemia   5. Chloride, decreased level     Plan admission   CRITICAL CARE Performed by: Devoria Albe L   Total critical care time: 40 min   Critical care time was exclusive of separately billable procedures and treating other patients.  Critical care was necessary to treat or prevent imminent or life-threatening deterioration.  Critical care was time spent personally by me on the following activities: development of treatment plan with patient and/or surrogate as well as nursing, discussions with consultants, evaluation of patient's response to treatment, examination of patient, obtaining history from patient or surrogate, ordering and performing treatments and interventions, ordering and review of laboratory studies, ordering and review of radiographic studies, pulse oximetry and re-evaluation of patient's condition.   MDM   I personally performed the services described in this documentation, which was scribed in my presence. The recorded information has been reviewed and  considered.  Devoria Albe, MD, [facility]      Ward Givens, MD 12/01/12 [phone]

## 2012-12-01 NOTE — ED Notes (Addendum)
Sent from Dr Ronal Fear office Had a seizure in the office.   Was in office to receive injections to face.  For trigeminal neuralgia. Was incontinent of urine,Did not injure tongue.  Says she has felt "disoriented" all day and trouble walking.

## 2012-12-01 NOTE — H&P (Addendum)
Hospital Admission Note Date: 12/01/2012  Patient name: Mariah Ibarra [facility] Medical record number: [id number] Date of birth: 1971/06/27 Age: 42 y.o. Gender: female PCP: Beryle Beams, MD  Attending physician: Christiane Ha, MD  Chief Complaint: seizure  History of Present Illness:  Mariah Ibarra is an 42 y.o. female who had a generalized seizure in Dr. Ronal Fear office. She was sent to the emergency room and subsequently had another generalized seizure. She has received Ativan and is postictal. She lacerated her lip and was incontinent of urine. There are reports from a boyfriend who is not currently here that she may have had similar episodes earlier this month. No documented history of epilepsy. She is unable to provide much history currently. Old chart has been reviewed including outpatient records. It appears that she was started on Elavil recently. There is some question as to whether she was started on tramadol as well recently. CAT scan of the brain shows nothing acute. Sodium is 103. Last month, her sodium was 135. Apparently, family was here earlier and provided some of the history. They are now gone.  Past Medical History  Diagnosis Date  . Stroke   . Hypertension   . Anxiety   . Panic attacks   . Trigeminal neuralgia   . Hypercholesteremia     Meds: See medicine reconciliation  Allergies: Ambien and Gabapentin History   Social History  . Marital Status: Divorced    Spouse Name: N/A    Number of Children: N/A  . Years of Education: N/A   Occupational History  . Not on file.   Social History Main Topics  . Smoking status: Never Smoker   . Smokeless tobacco: Never Used  . Alcohol Use: No     Comment: occ  . Drug Use: No  . Sexually Active: Yes    Birth Control/ Protection: None   Other Topics Concern  . Not on file   Social History Narrative  . No narrative on file   Family History  Problem Relation Age of Onset  . Depression Paternal Aunt   .  Depression Paternal Uncle   . Depression Maternal Grandfather    Past Surgical History  Procedure Date  . Appendectomy   . Tonsillectomy   . Gamma knife procedure november 2013   . Brain surgery     Review of Systems: Systems reviewed and as per HPI, otherwise negative.  Physical Exam: Blood pressure 128/80, pulse 110, temperature 99.1 F (37.3 C), temperature source Oral, resp. rate 22, height 5\' 2"  (1.575 m), weight 74.844 kg (165 lb), last menstrual period 11/01/2012, SpO2 100.00%. BP 129/72  Pulse 104  Temp 98 F (36.7 C) (Oral)  Resp 25  Ht 5\' 2"  (1.575 m)  Wt 71 kg (156 lb 8.4 oz)  BMI 28.63 kg/m2  SpO2 100%  LMP 11/01/2012  General Appearance:    somnolent. Briefly arousable. Quickly falls back asleep.does not follow commands. Answers questions occasionally   Head:    Normocephalic, without obvious abnormality, atraumatic  Eyes:    PERRL, conjunctiva/corneas clear, EOM's intact, fundi    benign, both eyes  Ears:    Normal TM's and external ear canals, both ears  Nose:   Nares normal, septum midline, mucosa normal, no drainage    or sinus tenderness  Throat:   Lips, mucosa, and tongue normal; teeth and gums normal  Neck:   Supple, symmetrical, trachea midline, no adenopathy;    thyroid:  no enlargement/tenderness/nodules; no carotid   bruit or  JVD  Back:     Symmetric, no curvature, ROM normal, no CVA tenderness  Lungs:     Clear to auscultation bilaterally, respirations unlabored  Chest Wall:    No tenderness or deformity   Heart:    Regular rate and rhythm, S1 and S2 normal, no murmur, rub   or gallop     Abdomen:     Soft, non-tender, bowel sounds active all four quadrants,    no masses, no organomegaly  Genitalia:   deferred   Rectal:   deferred  Extremities:   Extremities normal, atraumatic, no cyanosis or edema  Pulses:   2+ and symmetric all extremities  Skin:   excoriations on her legs with an abrasion on her right knee   Lymph nodes:   Cervical,  supraclavicular, and axillary nodes normal  Neurologic:   appears to be moving all 4 extremities. No obvious cranial nerve deficits. Deep tendon reflexes 2+.     Lab results: Basic Metabolic Panel:  Basename 12/01/12 1214  NA 103*  K 3.0*  CL 67*  CO2 16*  GLUCOSE 143*  BUN 7  CREATININE 0.59  CALCIUM 8.9  MG 1.5  PHOS --   Liver Function Tests:  Basename 12/01/12 1214  AST 37  ALT 12  ALKPHOS 19*  BILITOT 0.8  PROT 7.5  ALBUMIN 4.3   No results found for this basename: LIPASE:2,AMYLASE:2 in the last 72 hours No results found for this basename: AMMONIA:2 in the last 72 hours CBC:  Basename 12/01/12 1214  WBC 19.5*  NEUTROABS 16.3*  HGB 12.2  HCT 32.4*  MCV 85.9  PLT 441*   Cardiac Enzymes:  Basename 12/01/12 1214  CKTOTAL --  CKMB --  CKMBINDEX --  TROPONINI <0.30   BNP: No results found for this basename: PROBNP:3 in the last 72 hours D-Dimer: No results found for this basename: DDIMER:2 in the last 72 hours CBG:  Basename 12/01/12 1212  GLUCAP 175*  Urine Drug Screen: Drugs of Abuse     Component Value Date/Time   LABOPIA NONE DETECTED 10/13/2012 1815   LABOPIA NEGATIVE 08/24/2012 2115   COCAINSCRNUR NONE DETECTED 10/13/2012 1815   COCAINSCRNUR NEGATIVE 08/24/2012 2115   LABBENZ POSITIVE* 10/13/2012 1815   LABBENZ POSITIVE* 08/24/2012 2115   AMPHETMU NONE DETECTED 10/13/2012 1815   AMPHETMU NEGATIVE 08/24/2012 2115   THCU NONE DETECTED 10/13/2012 1815   LABBARB NONE DETECTED 10/13/2012 1815    Imaging results:  Ct Head Wo Contrast  12/01/2012  *RADIOLOGY REPORT*  Clinical Data: Seizures  CT HEAD WITHOUT CONTRAST  Technique:  Contiguous axial images were obtained from the base of the skull through the vertex without contrast. Study was obtained within 24 hours of patient arrival at the emergency department.  Comparison:  Brain CT August 24, 2012 and brain MRI October 25- 1013  Findings:  The patient has had a previous craniotomy in the  inferior left occipital region, stable.  Mild cerebellar atrophy on the left is stable.  The ventricles are normal in size and duration.  There is no apparent mass, hemorrhage, extra-axial fluid collection, or midline shift.  Gray-white compartments appears stable compared to prior studies without new gray-white compartment lesions. No evidence suggesting acute infarct.  No new bony lesions are identified.  Mastoid air cells are clear bilaterally.  IMPRESSION: Stable study.  Postoperative change in the left posterior fossa region, stable.  No evidence of acute appearing infarct, mass, or hemorrhage.  No new gray-white compartment lesions.   Original  Report Authenticated By: Bretta Bang, M.D.    Dg Foot Complete Left  12/01/2012  *RADIOLOGY REPORT*  Clinical Data: Pain post trauma  LEFT FOOT - COMPLETE 3+ VIEW  Comparison: None.  Findings:  Frontal, oblique, and lateral views were obtained. There is no fracture or dislocation.  Joint spaces appear intact. No erosive change.  IMPRESSION: No abnormality noted.   Original Report Authenticated By: Bretta Bang, M.D.     Assessment & Plan: Principal Problem:  *Seizure related to severe hyponatremia. Will correct hyponatremia slowly. No need for antiepileptic drug per Dr. Gerilyn Pilgrim and I agree. Active Problems:  Hyponatremia, new. Likely medication related. She is on a 5 side diuretic. Also recently started on Elavil. Both will be stopped. She will get hypertonic saline, as she has been having seizures. We'll check another stat basic metabolic panel and serial basic metabolic panels to guide therapy, to avoid overly aggressive correction. Will not increase sodium more than 9 mEq per liter over the next 24 hours. For now, will stop hypertonic saline solution after 4 hours, can reorder if sodium not improving. Will monitor in step down. Also check TSH.  Hypertension  High anion gap metabolic acidosis secondary to seizures  Hypokalemia: Replete slowly to  avoid further fluid shifts.  Trigeminal neuralgia  Hyperlipidemia  Depression  Anxiety  Raymon Schlarb L 12/01/2012, 3:07 PM

## 2012-12-02 DIAGNOSIS — G500 Trigeminal neuralgia: Secondary | ICD-10-CM

## 2012-12-02 DIAGNOSIS — E236 Other disorders of pituitary gland: Principal | ICD-10-CM

## 2012-12-02 DIAGNOSIS — E222 Syndrome of inappropriate secretion of antidiuretic hormone: Secondary | ICD-10-CM | POA: Diagnosis present

## 2012-12-02 LAB — MAGNESIUM: Magnesium: 2 mg/dL (ref 1.5–2.5)

## 2012-12-02 LAB — BASIC METABOLIC PANEL
CO2: 21 mEq/L (ref 19–32)
Calcium: 9.5 mg/dL (ref 8.4–10.5)
Chloride: 89 mEq/L — ABNORMAL LOW (ref 96–112)
GFR calc Af Amer: >90 mL/min (ref >90)
GFR calc non Af Amer: 78 mL/min — ABNORMAL LOW (ref >90)
Glucose, Bld: 104 mg/dL — ABNORMAL HIGH (ref 70–99)
Glucose, Bld: 102 mg/dL — ABNORMAL HIGH (ref 70–99)
Potassium: 2.8 mEq/L — ABNORMAL LOW (ref 3.5–5.1)
Potassium: 3.3 mEq/L — ABNORMAL LOW (ref 3.5–5.1)
Sodium: 124 mEq/L — ABNORMAL LOW (ref 135–145)
Sodium: 129 mEq/L — ABNORMAL LOW (ref 135–145)

## 2012-12-02 LAB — CBC
HCT: 36.9 % (ref 36.0–46.0)
Hemoglobin: 13.8 g/dL (ref 12.0–15.0)
MCH: 32.6 pg (ref 26.0–34.0)
MCV: 87.2 fL (ref 78.0–100.0)
Platelets: 462 10*3/uL — ABNORMAL HIGH (ref 150–400)
RBC: 4.23 MIL/uL (ref 3.87–5.11)
WBC: 12.4 10*3/uL — ABNORMAL HIGH (ref 4.0–10.5)

## 2012-12-02 MED ORDER — GABAPENTIN 400 MG PO CAPS: 800.0000 mg | ORAL_CAPSULE | Freq: Every day | ORAL | Status: DC

## 2012-12-02 MED ORDER — GABAPENTIN 400 MG PO CAPS
800.0000 mg | ORAL_CAPSULE | Freq: Three times a day (TID) | ORAL | Status: DC
  Administered 2012-12-02 – 2012-12-03 (×3): 800 mg via ORAL
  Filled 2012-12-02 (×3): qty 2

## 2012-12-02 MED ORDER — FAMOTIDINE IN NACL 20-0.9 MG/50ML-% IV SOLN
INTRAVENOUS | Status: AC
  Filled 2012-12-02: qty 50

## 2012-12-02 MED ORDER — OXYCODONE HCL 5 MG PO TABS: 10.0000 mg | ORAL_TABLET | ORAL | Status: DC | PRN

## 2012-12-02 MED ORDER — ALPRAZOLAM 1 MG PO TABS
1.0000 mg | ORAL_TABLET | Freq: Four times a day (QID) | ORAL | Status: DC | PRN
  Administered 2012-12-02 – 2012-12-03 (×4): 1 mg via ORAL
  Filled 2012-12-02: qty 2
  Filled 2012-12-02: qty 1
  Filled 2012-12-02: qty 2
  Filled 2012-12-02: qty 1

## 2012-12-02 MED ORDER — OXYCODONE HCL 5 MG PO TABS
5.0000 mg | ORAL_TABLET | ORAL | Status: DC | PRN
  Administered 2012-12-02 – 2012-12-03 (×5): 5 mg via ORAL
  Filled 2012-12-02 (×5): qty 1

## 2012-12-02 MED ORDER — IBUPROFEN 800 MG PO TABS: 800.0000 mg | ORAL_TABLET | Freq: Four times a day (QID) | ORAL | Status: DC | PRN

## 2012-12-02 MED ORDER — ZOLPIDEM TARTRATE 5 MG PO TABS
5.0000 mg | ORAL_TABLET | Freq: Every evening | ORAL | Status: DC | PRN
  Administered 2012-12-02: 5 mg via ORAL
  Filled 2012-12-02: qty 1

## 2012-12-02 MED ORDER — DIPHENHYDRAMINE HCL 50 MG/ML IJ SOLN
25.0000 mg | Freq: Once | INTRAMUSCULAR | Status: AC
  Administered 2012-12-02: 25 mg via INTRAVENOUS
  Filled 2012-12-02: qty 1

## 2012-12-02 MED ORDER — POTASSIUM CHLORIDE 10 MEQ/100ML IV SOLN
10.0000 meq | INTRAVENOUS | Status: AC
  Administered 2012-12-02 (×6): 10 meq via INTRAVENOUS
  Filled 2012-12-02: qty 600

## 2012-12-02 MED ORDER — SODIUM CHLORIDE 0.9 % IV SOLN
INTRAVENOUS | Status: DC
  Administered 2012-12-02: 500 mL via INTRAVENOUS
  Administered 2012-12-02: 1000 mL via INTRAVENOUS

## 2012-12-02 MED ORDER — BACLOFEN 10 MG PO TABS
10.0000 mg | ORAL_TABLET | Freq: Three times a day (TID) | ORAL | Status: DC | PRN
  Filled 2012-12-02: qty 1

## 2012-12-02 MED ORDER — FAMOTIDINE IN NACL 20-0.9 MG/50ML-% IV SOLN
20.0000 mg | Freq: Once | INTRAVENOUS | Status: AC
  Administered 2012-12-02: 20 mg via INTRAVENOUS
  Filled 2012-12-02: qty 50

## 2012-12-02 NOTE — Progress Notes (Signed)
Notified Dr. Rito Ehrlich of pt complaining of feeling that her tongue is swelling and beginning to have some difficulty swallowing. Also patient has developed a rash on her lower neck and shoulder area.

## 2012-12-02 NOTE — Progress Notes (Signed)
Hypokalemia   K replaced; Magnesium added on to the morning labs.

## 2012-12-02 NOTE — Progress Notes (Signed)
CRITICAL VALUE ALERT  Critical value received:  Serum osmolarity 2.20  Date of notification: 12/02/12   Time of notification:  0702  Critical value read back:yes  Nurse who received alert:  Lenia Housley S. Providence Lanius, RN  MD notified (1st page):  Dr. Lendell Caprice   Time of first page:  0716  MD notified (2nd page):  Time of second page:  Responding MD:    Time MD responded:

## 2012-12-02 NOTE — Progress Notes (Signed)
CRITICAL VALUE ALERT  Critical value received:  K+-2.8  Date of notification:  12/02/2012  Time of notification:  0300  Critical value read back:yes  Nurse who received alert:  Marcos Eke, RN  MD notified (1st page):  Dr. Osvaldo Shipper  Time of first page:  0310  MD notified (2nd page):  Time of second page:  Responding MD:  Dr. Frederico Hamman reported value to [place] and I let Dr. Rito Ehrlich know.  Time MD responded:

## 2012-12-02 NOTE — Progress Notes (Signed)
Overnight, no further seizures. Started complaining of lip swelling and received a single dose of Benadryl.  Subjective: Complains of a headache and a "knot on the back of her head". No rash. No itching. Reports having had seizure-like episodes over the past month. Takes a medication for hypertension, but denies taking lisinopril/hydrochlorothiazide which is listed on her home medication list. She cannot recall the name of the medication she takes. She does drink about a gallon of water a day. No previous history of severe electrolyte disturbances. No other new medications other than Elavil that she knows of. Denies taking tramadol. Does not recall the events of yesterday.  Objective: Vital signs in last 24 hours: Filed Vitals:   12/02/12 0600 12/02/12 0615 12/02/12 0655 12/02/12 0724  BP: 112/75     Pulse:  99 100   Temp: 99 F (37.2 C)   98.9 F (37.2 C)  TempSrc: Oral   Oral  Resp: 14 16 14    Height:      Weight: 74.2 kg (163 lb 9.3 oz)     SpO2: 100% 100% 99%    Weight change:   Intake/Output Summary (Last 24 hours) at 12/02/12 0802 Last data filed at 12/02/12 0630  Gross per 24 hour  Intake 1107.51 ml  Output   1700 ml  Net -592.49 ml   Gen.: Groggy. Oriented. Appropriate. Answers questions and follows commands. HEENT: No lip swelling, no tongue swelling. Lacerations noted on lower left. Lungs clear to auscultation bilaterally without wheeze rhonchi or rales Cardiovascular regular rate rhythm without murmurs gallops rubs Abdomen soft nontender nondistended Extremities no clubbing cyanosis or edema  Lab Results: Basic Metabolic Panel:  Lab 12/02/12 1610 12/01/12 2206 12/01/12 1422 12/01/12 1214  NA 124* 117* -- --  K 2.8* 3.0* -- --  CL 89* 84* -- --  CO2 21 21 -- --  GLUCOSE 104* 104* -- --  BUN 4* 5* -- --  CREATININE 0.64 0.60 -- --  CALCIUM 9.2 9.2 -- --  MG 2.0 -- -- 1.5  PHOS -- -- 2.1* --   Liver Function Tests:  Lab 12/01/12 1214  AST 37  ALT 12   ALKPHOS 19*  BILITOT 0.8  PROT 7.5  ALBUMIN 4.3   No results found for this basename: LIPASE:2,AMYLASE:2 in the last 168 hours No results found for this basename: AMMONIA:2 in the last 168 hours CBC:  Lab 12/01/12 1214  WBC 19.5*  NEUTROABS 16.3*  HGB 12.2  HCT 32.4*  MCV 85.9  PLT 441*   Cardiac Enzymes:  Lab 12/01/12 1214  CKTOTAL --  CKMB --  CKMBINDEX --  TROPONINI <0.30   BNP: No results found for this basename: PROBNP:3 in the last 168 hours D-Dimer: No results found for this basename: DDIMER:2 in the last 168 hours CBG:  Lab 12/01/12 1212  GLUCAP 175*   Hemoglobin A1C: No results found for this basename: HGBA1C in the last 168 hours Fasting Lipid Panel: No results found for this basename: CHOL,HDL,LDLCALC,TRIG,CHOLHDL,LDLDIRECT in the last 960 hours Thyroid Function Tests:  Lab 12/01/12 1422  TSH 1.150  T4TOTAL --  FREET4 --  T3FREE --  THYROIDAB --   Coagulation: No results found for this basename: LABPROT:4,INR:4 in the last 168 hours Anemia Panel: No results found for this basename: VITAMINB12,FOLATE,FERRITIN,TIBC,IRON,RETICCTPCT in the last 168 hours Urine Drug Screen: Drugs of Abuse     Component Value Date/Time   LABOPIA NONE DETECTED 12/01/2012 1425   LABOPIA NEGATIVE 08/24/2012 2115   COCAINSCRNUR NONE DETECTED  12/01/2012 1425   COCAINSCRNUR NEGATIVE 08/24/2012 2115   LABBENZ NONE DETECTED 12/01/2012 1425   LABBENZ POSITIVE* 08/24/2012 2115   AMPHETMU NONE DETECTED 12/01/2012 1425   AMPHETMU NEGATIVE 08/24/2012 2115   THCU NONE DETECTED 12/01/2012 1425   LABBARB NONE DETECTED 12/01/2012 1425    Alcohol Level: No results found for this basename: ETH:2 in the last 168 hours Urinalysis:  Lab 12/01/12 1425  COLORURINE YELLOW  LABSPEC <1.005*  PHURINE 6.0  GLUCOSEU NEGATIVE  HGBUR SMALL*  BILIRUBINUR NEGATIVE  KETONESUR NEGATIVE  PROTEINUR NEGATIVE  UROBILINOGEN 0.2  NITRITE NEGATIVE  LEUKOCYTESUR NEGATIVE   EKG shows normal  sinus rhythm  Micro Results: Recent Results (from the past 240 hour(s))  MRSA PCR SCREENING     Status: Normal   Collection Time   12/01/12  6:00 PM      Component Value Range Status Comment   MRSA by PCR NEGATIVE  NEGATIVE Final    Studies/Results: Ct Head Wo Contrast  12/01/2012  *RADIOLOGY REPORT*  Clinical Data: Seizures  CT HEAD WITHOUT CONTRAST  Technique:  Contiguous axial images were obtained from the base of the skull through the vertex without contrast. Study was obtained within 24 hours of patient arrival at the emergency department.  Comparison:  Brain CT August 24, 2012 and brain MRI October 25- 1013  Findings:  The patient has had a previous craniotomy in the inferior left occipital region, stable.  Mild cerebellar atrophy on the left is stable.  The ventricles are normal in size and duration.  There is no apparent mass, hemorrhage, extra-axial fluid collection, or midline shift.  Gray-white compartments appears stable compared to prior studies without new gray-white compartment lesions. No evidence suggesting acute infarct.  No new bony lesions are identified.  Mastoid air cells are clear bilaterally.  IMPRESSION: Stable study.  Postoperative change in the left posterior fossa region, stable.  No evidence of acute appearing infarct, mass, or hemorrhage.  No new gray-white compartment lesions.   Original Report Authenticated By: Bretta Bang, M.D.    Dg Foot Complete Left  12/01/2012  *RADIOLOGY REPORT*  Clinical Data: Pain post trauma  LEFT FOOT - COMPLETE 3+ VIEW  Comparison: None.  Findings:  Frontal, oblique, and lateral views were obtained. There is no fracture or dislocation.  Joint spaces appear intact. No erosive change.  IMPRESSION: No abnormality noted.   Original Report Authenticated By: Bretta Bang, M.D.    Scheduled Meds:   . potassium chloride  10 mEq Intravenous Q1 Hr x 6  . sodium chloride  3 mL Intravenous Q12H   Continuous Infusions:  PRN  Meds:.acetaminophen, acetaminophen, LORazepam, ondansetron (ZOFRAN) IV, ondansetron Assessment/Plan: Principal Problem:  *Seizure secondary to severe hyponatremia Active Problems:  Hyponatremia slowly correcting. Has not required a further dose of hypertonic saline. Has a low serum osmolality and inappropriately high urine osmolality. TSH is normal. Consistent with SIADH.  SIADH (syndrome of inappropriate ADH production) likely from amitriptyline.  Hypertension : We'll ask family to bring home medications.  Hypokalemia being repleted  Trigeminal neuralgia: Will resume some of her home medication at a lower dose.  Hyperlipidemia  Depression  Anxiety Increase activity. Continue step down monitoring. Change BMET to every 12 hours.   LOS: 1 day   Dequon Schnebly L 12/02/2012, 8:02 AM

## 2012-12-03 DIAGNOSIS — I10 Essential (primary) hypertension: Secondary | ICD-10-CM

## 2012-12-03 DIAGNOSIS — B370 Candidal stomatitis: Secondary | ICD-10-CM

## 2012-12-03 DIAGNOSIS — H6092 Unspecified otitis externa, left ear: Secondary | ICD-10-CM | POA: Clinically undetermined

## 2012-12-03 LAB — CBC WITH DIFFERENTIAL/PLATELET
Basophils Relative: 0 % (ref 0–1)
HCT: 35.4 % — ABNORMAL LOW (ref 36.0–46.0)
Hemoglobin: 13 g/dL (ref 12.0–15.0)
Lymphocytes Relative: 28 % (ref 12–46)
MCHC: 36.7 g/dL — ABNORMAL HIGH (ref 30.0–36.0)
Monocytes Absolute: 0.8 10*3/uL (ref 0.1–1.0)
Monocytes Relative: 8 % (ref 3–12)
Neutro Abs: 5.9 10*3/uL (ref 1.7–7.7)
Neutrophils Relative %: 62 % (ref 43–77)
RBC: 3.91 MIL/uL (ref 3.87–5.11)
WBC: 9.5 10*3/uL (ref 4.0–10.5)

## 2012-12-03 LAB — BASIC METABOLIC PANEL
Chloride: 99 mEq/L (ref 96–112)
GFR calc Af Amer: >90 mL/min (ref >90)
GFR calc non Af Amer: >90 mL/min (ref >90)
Potassium: 3.5 mEq/L (ref 3.5–5.1)
Sodium: 134 mEq/L — ABNORMAL LOW (ref 135–145)

## 2012-12-03 MED ORDER — DIPHENHYDRAMINE HCL 50 MG/ML IJ SOLN
25.0000 mg | Freq: Once | INTRAMUSCULAR | Status: AC
  Administered 2012-12-03: 25 mg via INTRAVENOUS
  Filled 2012-12-03: qty 1

## 2012-12-03 MED ORDER — NYSTATIN 100000 UNIT/ML MT SUSP
5.0000 mL | Freq: Four times a day (QID) | OROMUCOSAL | Status: DC
  Administered 2012-12-03: 500000 [IU] via ORAL
  Filled 2012-12-03: qty 5

## 2012-12-03 MED ORDER — LISINOPRIL 20 MG PO TABS: 20.0000 mg | ORAL_TABLET | Freq: Every day | ORAL | Status: DC

## 2012-12-03 MED ORDER — NYSTATIN 100000 UNIT/ML MT SUSP: 5.0000 mL | Freq: Four times a day (QID) | OROMUCOSAL | Status: DC

## 2012-12-03 MED ORDER — CIPROFLOXACIN-HYDROCORTISONE 0.2-1 % OT SUSP: 3.0000 [drp] | Freq: Two times a day (BID) | OTIC | Status: DC

## 2012-12-03 NOTE — Progress Notes (Signed)
12/03/12 1322 Patient discharged home this afternoon. IV site d/c'd and within normal limits per NT. Reviewed discharge instructions with patient, husband at bedside. Reviewed seizure precautions and when to call MD/seek medical attention. Given copy of instructions, medication list, prescriptions, f/u appointment information. Both verbalized understanding, stated will call Dr Ronal Fear office tomorrow for appointment. Pt left floor in stable condition via w/c accompanied by nursing staff. Earnstine Regal, RN

## 2012-12-03 NOTE — Progress Notes (Signed)
Pt c/o upper lip swelling. MD paged and orders given for Benadryl 25mg  IV.

## 2012-12-03 NOTE — Discharge Summary (Addendum)
Physician Discharge Summary  Patient ID: KESHAUNA DEGRAFFENREID MRN: [id number] DOB/AGE: 1971-06-27 42 y.o.  Admit date: 12/01/2012 Discharge date: 12/03/2012  Discharge Diagnoses:  Principal Problem:  *Seizure secondary to hyponatremia Active Problems:  Hyponatremia  Hypertension  High anion gap metabolic acidosis  Hypokalemia  Trigeminal neuralgia  Hyperlipidemia  Depression  Anxiety  Thrush  Otitis externa, left     Medication List     As of 12/03/2012  9:30 AM    STOP taking these medications         amitriptyline 25 MG tablet   Commonly known as: ELAVIL      lisinopril-hydrochlorothiazide 20-25 MG per tablet   Commonly known as: PRINZIDE,ZESTORETIC      traMADol 50 MG tablet   Commonly known as: ULTRAM      TAKE these medications         ALPRAZolam 1 MG tablet   Commonly known as: XANAX   Take 1 mg by mouth every 6 (six) hours as needed. Nerves/anxiety      baclofen 10 MG tablet   Commonly known as: LIORESAL   Take 10 mg by mouth 3 (three) times daily.      ciprofloxacin-hydrocortisone otic suspension   Commonly known as: CIPRO HC OTIC   Place 3 drops into the left ear 2 (two) times daily. For 5 days      fluticasone 50 MCG/ACT nasal spray   Commonly known as: FLONASE   Place 2 sprays into the nose daily.      gabapentin 800 MG tablet   Commonly known as: NEURONTIN   Take 800 mg by mouth 3 (three) times daily.      ibuprofen 800 MG tablet   Commonly known as: ADVIL,MOTRIN   Take 800 mg by mouth 3 (three) times daily.      lisinopril 20 MG tablet   Commonly known as: PRINIVIL,ZESTRIL   Take 1 tablet (20 mg total) by mouth daily.      nystatin 100000 UNIT/ML suspension   Commonly known as: MYCOSTATIN   Take 5 mLs (500,000 Units total) by mouth 4 (four) times daily.      oxyCODONE 15 MG Tb12   Commonly known as: OXYCONTIN   Take 15 mg by mouth every 12 (twelve) hours.      tetrahydrozoline-zinc 0.05-0.25 % ophthalmic solution   Commonly known as:  VISINE-AC   Place 2 drops into both eyes daily as needed. Dry Eyes      VITAMIN D (CHOLECALCIFEROL) PO   Take 1 tablet by mouth daily.      GNP VITAMIN D 400 UNITS Tabs   Generic drug: cholecalciferol   Take 1 tablet by mouth Daily.      VITAMIN E PO   Take 1 tablet by mouth daily.      zolpidem 10 MG tablet   Commonly known as: AMBIEN   Take 10 mg by mouth at bedtime as needed. Sleep            Discharge Orders    Future Orders Please Complete By Expires   Diet general      Activity as tolerated - No restrictions         Follow-up Information    Follow up with [facility], KOFI, MD. In 1 week.   Contact information:   [address] [place] [address] [phone]      Please check sodium level.     Disposition: 01-Home or Self Care  Discharged Condition: stable  Consults:  none  Labs:   Results for orders placed during the hospital encounter of 12/01/12 (from the past 48 hour(s))  GLUCOSE, CAPILLARY     Status: Abnormal   Collection Time   12/01/12 12:12 PM      Component Value Range Comment   Glucose-Capillary 175 (*) 70 - 99 mg/dL   CBC WITH DIFFERENTIAL     Status: Abnormal   Collection Time   12/01/12 12:14 PM      Component Value Range Comment   WBC 19.5 (*) 4.0 - 10.5 K/uL    RBC 3.77 (*) 3.87 - 5.11 MIL/uL    Hemoglobin 12.2  12.0 - 15.0 g/dL    HCT 16.1 (*) 09.6 - 46.0 %    MCV 85.9  78.0 - 100.0 fL    MCH 32.4  26.0 - 34.0 pg    MCHC 37.7 (*) 30.0 - 36.0 g/dL    RDW 04.5  40.9 - 81.1 %    Platelets 441 (*) 150 - 400 K/uL    Neutrophils Relative 84 (*) 43 - 77 %    Neutro Abs 16.3 (*) 1.7 - 7.7 K/uL    Lymphocytes Relative 9 (*) 12 - 46 %    Lymphs Abs 1.8  0.7 - 4.0 K/uL    Monocytes Relative 7  3 - 12 %    Monocytes Absolute 1.3 (*) 0.1 - 1.0 K/uL    Eosinophils Relative 0  0 - 5 %    Eosinophils Absolute 0.0  0.0 - 0.7 K/uL    Basophils Relative 0  0 - 1 %    Basophils Absolute 0.0  0.0 - 0.1 K/uL   COMPREHENSIVE METABOLIC  PANEL     Status: Abnormal   Collection Time   12/01/12 12:14 PM      Component Value Range Comment   Sodium 103 (*) 135 - 145 mEq/L    Potassium 3.0 (*) 3.5 - 5.1 mEq/L    Chloride 67 (*) 96 - 112 mEq/L    CO2 16 (*) 19 - 32 mEq/L    Glucose, Bld 143 (*) 70 - 99 mg/dL    BUN 7  6 - 23 mg/dL    Creatinine, Ser 9.14  0.50 - 1.10 mg/dL    Calcium 8.9  8.4 - 78.2 mg/dL    Total Protein 7.5  6.0 - 8.3 g/dL    Albumin 4.3  3.5 - 5.2 g/dL    AST 37  0 - 37 U/L    ALT 12  0 - 35 U/L    Alkaline Phosphatase 19 (*) 39 - 117 U/L    Total Bilirubin 0.8  0.3 - 1.2 mg/dL    GFR calc non Af Amer >90  >90 mL/min    GFR calc Af Amer >90  >90 mL/min   MAGNESIUM     Status: Normal   Collection Time   12/01/12 12:14 PM      Component Value Range Comment   Magnesium 1.5  1.5 - 2.5 mg/dL   TROPONIN I     Status: Normal   Collection Time   12/01/12 12:14 PM      Component Value Range Comment   Troponin I <0.30  <0.30 ng/mL   BASIC METABOLIC PANEL     Status: Abnormal   Collection Time   12/01/12  2:22 PM      Component Value Range Comment   Sodium 105 (*) 135 - 145 mEq/L    Potassium 2.8 (*)  3.5 - 5.1 mEq/L    Chloride 70 (*) 96 - 112 mEq/L    CO2 21  19 - 32 mEq/L    Glucose, Bld 121 (*) 70 - 99 mg/dL    BUN 6  6 - 23 mg/dL    Creatinine, Ser 0.45  0.50 - 1.10 mg/dL    Calcium 8.7  8.4 - 40.9 mg/dL    GFR calc non Af Amer >90  >90 mL/min    GFR calc Af Amer >90  >90 mL/min   PHOSPHORUS     Status: Abnormal   Collection Time   12/01/12  2:22 PM      Component Value Range Comment   Phosphorus 2.1 (*) 2.3 - 4.6 mg/dL   OSMOLALITY     Status: Abnormal   Collection Time   12/01/12  2:22 PM      Component Value Range Comment   Osmolality 220 (*) 275 - 300 mOsm/kg Result repeated and verified.  TSH     Status: Normal   Collection Time   12/01/12  2:22 PM      Component Value Range Comment   TSH 1.150  0.350 - 4.500 uIU/mL   URINALYSIS, ROUTINE W REFLEX MICROSCOPIC     Status: Abnormal    Collection Time   12/01/12  2:25 PM      Component Value Range Comment   Color, Urine YELLOW  YELLOW    APPearance CLEAR  CLEAR    Specific Gravity, Urine <1.005 (*) 1.005 - 1.030    pH 6.0  5.0 - 8.0    Glucose, UA NEGATIVE  NEGATIVE mg/dL    Hgb urine dipstick SMALL (*) NEGATIVE    Bilirubin Urine NEGATIVE  NEGATIVE    Ketones, ur NEGATIVE  NEGATIVE mg/dL    Protein, ur NEGATIVE  NEGATIVE mg/dL    Urobilinogen, UA 0.2  0.0 - 1.0 mg/dL    Nitrite NEGATIVE  NEGATIVE    Leukocytes, UA NEGATIVE  NEGATIVE   URINE RAPID DRUG SCREEN (HOSP PERFORMED)     Status: Normal   Collection Time   12/01/12  2:25 PM      Component Value Range Comment   Opiates NONE DETECTED  NONE DETECTED    Cocaine NONE DETECTED  NONE DETECTED    Benzodiazepines NONE DETECTED  NONE DETECTED    Amphetamines NONE DETECTED  NONE DETECTED    Tetrahydrocannabinol NONE DETECTED  NONE DETECTED    Barbiturates NONE DETECTED  NONE DETECTED   OSMOLALITY, URINE     Status: Abnormal   Collection Time   12/01/12  2:25 PM      Component Value Range Comment   Osmolality, Ur 173 (*) 390 - 1090 mOsm/kg   URINE MICROSCOPIC-ADD ON     Status: Normal   Collection Time   12/01/12  2:25 PM      Component Value Range Comment   Squamous Epithelial / LPF RARE  RARE    WBC, UA 0-2  <3 WBC/hpf    RBC / HPF 0-2  <3 RBC/hpf    Bacteria, UA RARE  RARE   MRSA PCR SCREENING     Status: Normal   Collection Time   12/01/12  6:00 PM      Component Value Range Comment   MRSA by PCR NEGATIVE  NEGATIVE   BASIC METABOLIC PANEL     Status: Abnormal   Collection Time   12/01/12  6:15 PM      Component Value Range Comment  Sodium 112 (*) 135 - 145 mEq/L    Potassium 2.8 (*) 3.5 - 5.1 mEq/L    Chloride 79 (*) 96 - 112 mEq/L DELTA CHECK NOTED   CO2 20  19 - 32 mEq/L    Glucose, Bld 105 (*) 70 - 99 mg/dL    BUN 5 (*) 6 - 23 mg/dL    Creatinine, Ser 7.82  0.50 - 1.10 mg/dL    Calcium 8.7  8.4 - 95.6 mg/dL    GFR calc non Af Amer >90  >90  mL/min    GFR calc Af Amer >90  >90 mL/min   BASIC METABOLIC PANEL     Status: Abnormal   Collection Time   12/01/12 10:06 PM      Component Value Range Comment   Sodium 117 (*) 135 - 145 mEq/L    Potassium 3.0 (*) 3.5 - 5.1 mEq/L    Chloride 84 (*) 96 - 112 mEq/L    CO2 21  19 - 32 mEq/L    Glucose, Bld 104 (*) 70 - 99 mg/dL    BUN 5 (*) 6 - 23 mg/dL    Creatinine, Ser 2.13  0.50 - 1.10 mg/dL    Calcium 9.2  8.4 - 08.6 mg/dL    GFR calc non Af Amer >90  >90 mL/min    GFR calc Af Amer >90  >90 mL/min   BASIC METABOLIC PANEL     Status: Abnormal   Collection Time   12/02/12  2:02 AM      Component Value Range Comment   Sodium 124 (*) 135 - 145 mEq/L DELTA CHECK NOTED   Potassium 2.8 (*) 3.5 - 5.1 mEq/L    Chloride 89 (*) 96 - 112 mEq/L    CO2 21  19 - 32 mEq/L    Glucose, Bld 104 (*) 70 - 99 mg/dL    BUN 4 (*) 6 - 23 mg/dL    Creatinine, Ser 5.78  0.50 - 1.10 mg/dL    Calcium 9.2  8.4 - 46.9 mg/dL    GFR calc non Af Amer >90  >90 mL/min    GFR calc Af Amer >90  >90 mL/min   MAGNESIUM     Status: Normal   Collection Time   12/02/12  2:02 AM      Component Value Range Comment   Magnesium 2.0  1.5 - 2.5 mg/dL   CBC     Status: Abnormal   Collection Time   12/02/12  6:21 AM      Component Value Range Comment   WBC 12.4 (*) 4.0 - 10.5 K/uL    RBC 4.23  3.87 - 5.11 MIL/uL    Hemoglobin 13.8  12.0 - 15.0 g/dL    HCT 62.9  52.8 - 41.3 %    MCV 87.2  78.0 - 100.0 fL    MCH 32.6  26.0 - 34.0 pg    MCHC 37.4 (*) 30.0 - 36.0 g/dL    RDW 24.4  01.0 - 27.2 %    Platelets 462 (*) 150 - 400 K/uL   BASIC METABOLIC PANEL     Status: Abnormal   Collection Time   12/02/12  3:14 PM      Component Value Range Comment   Sodium 129 (*) 135 - 145 mEq/L    Potassium 3.3 (*) 3.5 - 5.1 mEq/L    Chloride 93 (*) 96 - 112 mEq/L    CO2 24  19 - 32 mEq/L  Glucose, Bld 102 (*) 70 - 99 mg/dL    BUN 3 (*) 6 - 23 mg/dL    Creatinine, Ser 1.61  0.50 - 1.10 mg/dL    Calcium 9.5  8.4 - 09.6 mg/dL    GFR  calc non Af Amer 78 (*) >90 mL/min    GFR calc Af Amer >90  >90 mL/min   BASIC METABOLIC PANEL     Status: Abnormal   Collection Time   12/03/12  6:30 AM      Component Value Range Comment   Sodium 134 (*) 135 - 145 mEq/L    Potassium 3.5  3.5 - 5.1 mEq/L    Chloride 99  96 - 112 mEq/L    CO2 22  19 - 32 mEq/L    Glucose, Bld 100 (*) 70 - 99 mg/dL    BUN 7  6 - 23 mg/dL    Creatinine, Ser 0.45  0.50 - 1.10 mg/dL    Calcium 9.1  8.4 - 40.9 mg/dL    GFR calc non Af Amer >90  >90 mL/min    GFR calc Af Amer >90  >90 mL/min   CBC WITH DIFFERENTIAL     Status: Abnormal   Collection Time   12/03/12  6:30 AM      Component Value Range Comment   WBC 9.5  4.0 - 10.5 K/uL    RBC 3.91  3.87 - 5.11 MIL/uL    Hemoglobin 13.0  12.0 - 15.0 g/dL    HCT 81.1 (*) 91.4 - 46.0 %    MCV 90.5  78.0 - 100.0 fL    MCH 33.2  26.0 - 34.0 pg    MCHC 36.7 (*) 30.0 - 36.0 g/dL    RDW 78.2  95.6 - 21.3 %    Platelets 459 (*) 150 - 400 K/uL    Neutrophils Relative 62  43 - 77 %    Neutro Abs 5.9  1.7 - 7.7 K/uL    Lymphocytes Relative 28  12 - 46 %    Lymphs Abs 2.7  0.7 - 4.0 K/uL    Monocytes Relative 8  3 - 12 %    Monocytes Absolute 0.8  0.1 - 1.0 K/uL    Eosinophils Relative 1  0 - 5 %    Eosinophils Absolute 0.1  0.0 - 0.7 K/uL    Basophils Relative 0  0 - 1 %    Basophils Absolute 0.0  0.0 - 0.1 K/uL     Diagnostics:  Ct Head Wo Contrast  12/01/2012  *RADIOLOGY REPORT*  Clinical Data: Seizures  CT HEAD WITHOUT CONTRAST  Technique:  Contiguous axial images were obtained from the base of the skull through the vertex without contrast. Study was obtained within 24 hours of patient arrival at the emergency department.  Comparison:  Brain CT August 24, 2012 and brain MRI October 25- 1013  Findings:  The patient has had a previous craniotomy in the inferior left occipital region, stable.  Mild cerebellar atrophy on the left is stable.  The ventricles are normal in size and duration.  There is no apparent  mass, hemorrhage, extra-axial fluid collection, or midline shift.  Gray-white compartments appears stable compared to prior studies without new gray-white compartment lesions. No evidence suggesting acute infarct.  No new bony lesions are identified.  Mastoid air cells are clear bilaterally.  IMPRESSION: Stable study.  Postoperative change in the left posterior fossa region, stable.  No evidence of acute appearing infarct, mass,  or hemorrhage.  No new gray-white compartment lesions.   Original Report Authenticated By: Bretta Bang, M.D.    Dg Foot Complete Left  12/01/2012  *RADIOLOGY REPORT*  Clinical Data: Pain post trauma  LEFT FOOT - COMPLETE 3+ VIEW  Comparison: None.  Findings:  Frontal, oblique, and lateral views were obtained. There is no fracture or dislocation.  Joint spaces appear intact. No erosive change.  IMPRESSION: No abnormality noted.   Original Report Authenticated By: Bretta Bang, M.D.    EKG: NSR  Full Code   Hospital Course: See H&P for complete admission details. The patient is a 42 year old white female who presented after having a generalized seizure and Dr. Ronal Fear office. She was postictal and incontinent of urine. In the emergency room, she had another generalized seizure that was witnessed. She bit her lip and was postictal period workup was significant for a sodium of 103. This is a new problem for her. She was recently started on amitriptyline. Also, she is on a thiazide diuretic. These were stopped. Patient was given hypertonic saline and monitored and step down. By the time of discharge, her sodium was 134. She is alert and able to give more history. She reports that she may have had a few episodes prior to the day of admission but never sought medical attention. See started after she started amitriptyline. Workup was consistent with SIADH. She had no further seizures. I discussed the case with Dr. Gerilyn Pilgrim who does not feel that antiepileptic drug is needed at  this time. On the day of discharge, she complained of mouth pain. She has evidence of thrush. She reportedly gets steroid injections for her trigeminal neuralgia, so the thrush is presumably secondary to steroids. She was started on nystatin and will be given a prescription for home use. She also complained of ear pain and had evidence of mild otitis externa on the left, so Cipro drops have been prescribed. I have stopped her thiazide and given her a prescription for lisinopril without the hydrochlorothiazide component. She may followup with Dr. Gerilyn Pilgrim next week for any further workup is needed. I would recommend checking blood pressure and sodium. Total time on the day of discharge greater than 30 minutes  Discharge Exam:  Blood pressure 118/78, pulse 77, temperature 98.1 F (36.7 C), temperature source Oral, resp. rate 18, height 5\' 2"  (1.575 m), weight 74.2 kg (163 lb 9.3 oz), last menstrual period 11/27/2012, SpO2 100.00%.  Gen:  Alert, oriented. HEENT: White patches in mouth. No lip swelling. Right tympanic membrane normal. Left ear canal partially obstructed with inflammation of canal walls. Unable to visualize tympanic membrane. Neurologic cranial nerves intact. Motor strength 5 out of 5.  SignedChristiane Ha 12/03/2012, 9:30 AM

## 2012-12-05 NOTE — Progress Notes (Signed)
UR Chart Review Completed  

## 2013-01-01 ENCOUNTER — Ambulatory Visit (HOSPITAL_COMMUNITY): Payer: Self-pay | Admitting: Psychiatry

## 2013-01-10 ENCOUNTER — Telehealth (HOSPITAL_COMMUNITY): Payer: Self-pay | Admitting: Psychiatry

## 2013-01-11 NOTE — Telephone Encounter (Signed)
Not able to retreive note.

## 2013-01-25 ENCOUNTER — Other Ambulatory Visit (HOSPITAL_COMMUNITY): Payer: Self-pay | Admitting: Psychiatry

## 2013-01-25 ENCOUNTER — Ambulatory Visit (HOSPITAL_COMMUNITY): Payer: Self-pay | Admitting: Psychiatry

## 2013-12-29 ENCOUNTER — Emergency Department (HOSPITAL_COMMUNITY): Payer: BC Managed Care – PPO

## 2013-12-29 ENCOUNTER — Emergency Department (HOSPITAL_COMMUNITY)
Admission: EM | Admit: 2013-12-29 | Discharge: 2013-12-29 | Disposition: A | Discharge status: Home / Self Care | Payer: BC Managed Care – PPO | Attending: Emergency Medicine | Admitting: Emergency Medicine

## 2013-12-29 ENCOUNTER — Encounter (HOSPITAL_COMMUNITY): Payer: Self-pay | Admitting: Emergency Medicine

## 2013-12-29 DIAGNOSIS — Z8673 Personal history of transient ischemic attack (TIA), and cerebral infarction without residual deficits: Secondary | ICD-10-CM | POA: Insufficient documentation

## 2013-12-29 DIAGNOSIS — Z862 Personal history of diseases of the blood and blood-forming organs and certain disorders involving the immune mechanism: Secondary | ICD-10-CM | POA: Insufficient documentation

## 2013-12-29 DIAGNOSIS — I10 Essential (primary) hypertension: Secondary | ICD-10-CM | POA: Insufficient documentation

## 2013-12-29 DIAGNOSIS — F411 Generalized anxiety disorder: Secondary | ICD-10-CM | POA: Insufficient documentation

## 2013-12-29 DIAGNOSIS — G500 Trigeminal neuralgia: Secondary | ICD-10-CM | POA: Insufficient documentation

## 2013-12-29 DIAGNOSIS — R112 Nausea with vomiting, unspecified: Secondary | ICD-10-CM | POA: Insufficient documentation

## 2013-12-29 DIAGNOSIS — R1032 Left lower quadrant pain: Secondary | ICD-10-CM | POA: Insufficient documentation

## 2013-12-29 DIAGNOSIS — Z791 Long term (current) use of non-steroidal anti-inflammatories (NSAID): Secondary | ICD-10-CM | POA: Insufficient documentation

## 2013-12-29 DIAGNOSIS — R1031 Right lower quadrant pain: Secondary | ICD-10-CM | POA: Insufficient documentation

## 2013-12-29 DIAGNOSIS — R109 Unspecified abdominal pain: Secondary | ICD-10-CM

## 2013-12-29 DIAGNOSIS — R0682 Tachypnea, not elsewhere classified: Secondary | ICD-10-CM | POA: Insufficient documentation

## 2013-12-29 DIAGNOSIS — R079 Chest pain, unspecified: Secondary | ICD-10-CM | POA: Insufficient documentation

## 2013-12-29 DIAGNOSIS — IMO0002 Reserved for concepts with insufficient information to code with codable children: Secondary | ICD-10-CM | POA: Insufficient documentation

## 2013-12-29 DIAGNOSIS — Z3202 Encounter for pregnancy test, result negative: Secondary | ICD-10-CM | POA: Insufficient documentation

## 2013-12-29 DIAGNOSIS — Z8639 Personal history of other endocrine, nutritional and metabolic disease: Secondary | ICD-10-CM | POA: Insufficient documentation

## 2013-12-29 DIAGNOSIS — R197 Diarrhea, unspecified: Secondary | ICD-10-CM | POA: Insufficient documentation

## 2013-12-29 DIAGNOSIS — Z79899 Other long term (current) drug therapy: Secondary | ICD-10-CM | POA: Insufficient documentation

## 2013-12-29 LAB — CBC WITH DIFFERENTIAL/PLATELET
BASOS ABS: 0 10*3/uL (ref 0.0–0.1)
BASOS PCT: 1 % (ref 0–1)
EOS PCT: 2 % (ref 0–5)
Eosinophils Absolute: 0.2 10*3/uL (ref 0.0–0.7)
HCT: 40.1 % (ref 36.0–46.0)
Hemoglobin: 13.3 g/dL (ref 12.0–15.0)
LYMPHS PCT: 26 % (ref 12–46)
Lymphs Abs: 1.9 10*3/uL (ref 0.7–4.0)
MCH: 32.4 pg (ref 26.0–34.0)
MCHC: 33.2 g/dL (ref 30.0–36.0)
MCV: 97.6 fL (ref 78.0–100.0)
Monocytes Absolute: 0.5 10*3/uL (ref 0.1–1.0)
Monocytes Relative: 7 % (ref 3–12)
Neutro Abs: 4.6 10*3/uL (ref 1.7–7.7)
Neutrophils Relative %: 64 % (ref 43–77)
PLATELETS: 369 10*3/uL (ref 150–400)
RBC: 4.11 MIL/uL (ref 3.87–5.11)
RDW: 12.7 % (ref 11.5–15.5)
WBC: 7.2 10*3/uL (ref 4.0–10.5)

## 2013-12-29 LAB — COMPREHENSIVE METABOLIC PANEL
ALBUMIN: 3.8 g/dL (ref 3.5–5.2)
ALT: 12 U/L (ref 0–35)
AST: 16 U/L (ref 0–37)
Alkaline Phosphatase: 23 U/L — ABNORMAL LOW (ref 39–117)
BUN: 12 mg/dL (ref 6–23)
CALCIUM: 10.6 mg/dL — AB (ref 8.4–10.5)
CO2: 26 mEq/L (ref 19–32)
Chloride: 104 mEq/L (ref 96–112)
Creatinine, Ser: 0.8 mg/dL (ref 0.50–1.10)
GFR calc Af Amer: >90 mL/min (ref >90)
GFR, EST NON AFRICAN AMERICAN: 90 mL/min — AB (ref >90)
Glucose, Bld: 94 mg/dL (ref 70–99)
Potassium: 4.4 mEq/L (ref 3.7–5.3)
SODIUM: 142 meq/L (ref 137–147)
TOTAL PROTEIN: 7.9 g/dL (ref 6.0–8.3)
Total Bilirubin: <0.2 mg/dL — ABNORMAL LOW (ref 0.3–1.2)

## 2013-12-29 LAB — LIPASE, BLOOD: Lipase: 48 U/L (ref 11–59)

## 2013-12-29 LAB — URINALYSIS, ROUTINE W REFLEX MICROSCOPIC
Bilirubin Urine: NEGATIVE
GLUCOSE, UA: NEGATIVE mg/dL
Hgb urine dipstick: NEGATIVE
Ketones, ur: NEGATIVE mg/dL
LEUKOCYTES UA: NEGATIVE
Nitrite: NEGATIVE
PH: 8 (ref 5.0–8.0)
Protein, ur: NEGATIVE mg/dL
SPECIFIC GRAVITY, URINE: 1.01 (ref 1.005–1.030)
Urobilinogen, UA: 0.2 mg/dL (ref 0.0–1.0)

## 2013-12-29 LAB — PREGNANCY, URINE: Preg Test, Ur: NEGATIVE

## 2013-12-29 MED ORDER — HYDROMORPHONE HCL PF 1 MG/ML IJ SOLN
1.0000 mg | Freq: Once | INTRAMUSCULAR | Status: AC
  Administered 2013-12-29: 1 mg via INTRAVENOUS
  Filled 2013-12-29: qty 1

## 2013-12-29 MED ORDER — HYDROMORPHONE HCL 2 MG PO TABS: 2.0000 mg | ORAL_TABLET | Freq: Four times a day (QID) | ORAL | Status: DC | PRN

## 2013-12-29 MED ORDER — ONDANSETRON HCL 4 MG/2ML IJ SOLN
4.0000 mg | Freq: Once | INTRAMUSCULAR | Status: AC
  Administered 2013-12-29: 4 mg via INTRAVENOUS
  Filled 2013-12-29: qty 2

## 2013-12-29 MED ORDER — SODIUM CHLORIDE 0.9 % IJ SOLN
INTRAMUSCULAR | Status: AC
  Administered 2013-12-29: 13:00:00
  Filled 2013-12-29: qty 350

## 2013-12-29 MED ORDER — IOHEXOL 300 MG/ML  SOLN
50.0000 mL | Freq: Once | INTRAMUSCULAR | Status: AC | PRN
  Administered 2013-12-29: 50 mL via ORAL

## 2013-12-29 MED ORDER — OMEPRAZOLE 20 MG PO CPDR: 20.0000 mg | DELAYED_RELEASE_CAPSULE | Freq: Every day | ORAL | Status: DC

## 2013-12-29 MED ORDER — DIPHENHYDRAMINE HCL 50 MG/ML IJ SOLN
25.0000 mg | Freq: Once | INTRAMUSCULAR | Status: AC
  Administered 2013-12-29: 25 mg via INTRAVENOUS
  Filled 2013-12-29: qty 1

## 2013-12-29 MED ORDER — LACTATED RINGERS IV SOLN
INTRAVENOUS | Status: DC
  Administered 2013-12-29: 12:00:00 via INTRAVENOUS

## 2013-12-29 MED ORDER — IOHEXOL 300 MG/ML  SOLN
100.0000 mL | Freq: Once | INTRAMUSCULAR | Status: AC | PRN
  Administered 2013-12-29: 100 mL via INTRAVENOUS

## 2013-12-29 MED ORDER — ONDANSETRON 4 MG PO TBDP: 4.0000 mg | ORAL_TABLET | Freq: Three times a day (TID) | ORAL | Status: DC | PRN

## 2013-12-29 NOTE — ED Provider Notes (Signed)
CSN: [id number]     Arrival date & time 12/29/13  1052 History   This chart was scribed for Mariah Munchobert Annabella Elford, MD by Quintella ReichertMatthew Underwood, ED scribe.  This patient was seen in room APA18/[facility] and the patient's care was started at 11:10 AM.    Chief Complaint  Patient presents with  . Emesis  . Chest Pain    The history is provided by the patient. No language interpreter was used.    HPI Comments: Mariah AquasMelinda F Ibarra is a 43 y.o. female who presents to the Emergency Department complaining of sternal CP that began yesterday with associated nausea, vomiting, diarrhea, and abdominal pain.  Pt reports that all of her symptoms began approximately 24 hours ago.  She localized abdominal pain to the lower abdomen in the area of a previous surgery, and also states the area was "swollen" yesterday.  She states she has had diarrhea approximately every 30 minutes.  She notes that she also experienced some lightheadedness and SOB.  She denies syncope syncope or fever.  Pt reports she is scheduled to have endometrial ablation due to dysfunctional uterine bleeding.  Pt reports h/o TIA in 2000.  She takes aspirin regularly but is not on blood-thinners.    Past Medical History  Diagnosis Date  . Stroke   . Hypertension   . Anxiety   . Panic attacks   . Trigeminal neuralgia   . Hypercholesteremia    Past Surgical History  Procedure Laterality Date  . Appendectomy    . Tonsillectomy    . Gamma knife procedure november 2013    . Brain surgery     Family History  Problem Relation Age of Onset  . Depression Paternal Aunt   . Depression Paternal Uncle   . Depression Maternal Grandfather    History  Substance Use Topics  . Smoking status: Never Smoker   . Smokeless tobacco: Never Used  . Alcohol Use: No     Comment: occ   OB History   Grav Para Term Preterm Abortions TAB SAB Ect Mult Living                 Review of Systems  Constitutional:       Per HPI, otherwise negative  HENT:       Per  HPI, otherwise negative  Respiratory:       Per HPI, otherwise negative  Cardiovascular:       Per HPI, otherwise negative  Gastrointestinal: Positive for nausea, vomiting, abdominal pain and diarrhea.  Endocrine:       Negative aside from HPI  Genitourinary:       Neg aside from HPI   Musculoskeletal:       Per HPI, otherwise negative  Skin: Negative.   Neurological: Negative for syncope.       Allergies  Ambien  Home Medications   Current Outpatient Rx  Name  Route  Sig  Dispense  Refill  . ALPRAZolam (XANAX) 1 MG tablet   Oral   Take 1 mg by mouth every 6 (six) hours as needed. Nerves/anxiety         . baclofen (LIORESAL) 10 MG tablet   Oral   Take 10 mg by mouth 3 (three) times daily.           . cholecalciferol (GNP VITAMIN D) 400 UNITS TABS   Oral   Take 1 tablet by mouth Daily.         . ciprofloxacin-hydrocortisone (CIPRO HC) otic suspension  Left Ear   Place 3 drops into the left ear 2 (two) times daily. For 5 days   10 mL   0   . fluticasone (FLONASE) 50 MCG/ACT nasal spray   Nasal   Place 2 sprays into the nose daily.         Marland Kitchen gabapentin (NEURONTIN) 800 MG tablet   Oral   Take 800 mg by mouth 3 (three) times daily.         Marland Kitchen ibuprofen (ADVIL,MOTRIN) 800 MG tablet   Oral   Take 800 mg by mouth 3 (three) times daily.          Marland Kitchen lisinopril (PRINIVIL) 20 MG tablet   Oral   Take 1 tablet (20 mg total) by mouth daily.   30 tablet   0   . nystatin (MYCOSTATIN) 100000 UNIT/ML suspension   Oral   Take 5 mLs (500,000 Units total) by mouth 4 (four) times daily.   60 mL   0   . oxyCODONE (OXYCONTIN) 15 MG TB12   Oral   Take 15 mg by mouth every 12 (twelve) hours.         Marland Kitchen tetrahydrozoline-zinc (VISINE-AC) 0.05-0.25 % ophthalmic solution   Both Eyes   Place 2 drops into both eyes daily as needed. Dry Eyes         . VITAMIN D, CHOLECALCIFEROL, PO   Oral   Take 1 tablet by mouth daily.         Marland Kitchen VITAMIN E PO   Oral    Take 1 tablet by mouth daily.         Marland Kitchen zolpidem (AMBIEN) 10 MG tablet   Oral   Take 10 mg by mouth at bedtime as needed. Sleep          There were no vitals taken for this visit. Physical Exam  Nursing note and vitals reviewed. Constitutional: She is oriented to person, place, and time. She appears well-developed and well-nourished. No distress.  HENT:  Head: Normocephalic and atraumatic.  Eyes: Conjunctivae and EOM are normal.  Cardiovascular: Normal rate and regular rhythm.   Pulmonary/Chest: Breath sounds normal. No stridor. Tachypnea noted. No respiratory distress. She exhibits tenderness (Tender to palpation of mid-sternum).  Abdominal: Soft. She exhibits no distension. There is tenderness. There is guarding.  Guarding and tenderness in RLQ, less tender in LLQ  Musculoskeletal: She exhibits no edema.  Chest tender  Neurological: She is alert and oriented to person, place, and time. No cranial nerve deficit.  Skin: Skin is warm and dry.  Psychiatric: She has a normal mood and affect.    ED Course  Procedures (including critical care time)    COORDINATION OF CARE: 11:16 AM-Discussed treatment plan which includes nausea, CAT scan, blood work, UA , IV with fluids with pt at bedside and pt agreed to plan.      Labs Review Labs Reviewed  COMPREHENSIVE METABOLIC PANEL - Abnormal; Notable for the following:    Calcium 10.6 (*)    Alkaline Phosphatase 23 (*)    Total Bilirubin <0.2 (*)    GFR calc non Af Amer 90 (*)    All other components within normal limits  CBC WITH DIFFERENTIAL  LIPASE, BLOOD  PREGNANCY, URINE  URINALYSIS, ROUTINE W REFLEX MICROSCOPIC   Imaging Review Ct Abdomen Pelvis W Contrast  12/29/2013   CLINICAL DATA:  Nausea, vomiting, upper abdominal pain. Prior appendectomy.  EXAM: CT ABDOMEN AND PELVIS WITH CONTRAST  TECHNIQUE: Multidetector CT imaging  of the abdomen and pelvis was performed using the standard protocol following bolus administration  of intravenous contrast.  CONTRAST:  OMNIPAQUE IOHEXOL 300 MG/ML SOLN, 50mL OMNIPAQUE IOHEXOL 300 MG/ML SOLN  COMPARISON:  06/21/2003 by report only  FINDINGS: Visualized lung bases clear. Gallbladder is nondistended. Unremarkable liver, spleen, adrenal glands, pancreas, left kidney. There is an exophytic 6.4 cm cyst from the interpolar region right kidney. No hydronephrosis. Minimal calcified plaque in the aorta without aneurysm or stenosis. Stomach is distended by ingested material. Small bowel and colon are nondilated. Appendix surgically absent. Urinary bladder incompletely distended. Bilateral pelvic phleboliths. Uterus and adnexal regions unremarkable. No ascites. No free air. No adenopathy localized. Regional bones unremarkable.  IMPRESSION: 1. No acute abdominal process. 2. Right renal cyst.   Electronically Signed   By: Oley Balm M.D.   On: 12/29/2013 13:51     EKG Interpretation   Date/Time:  Saturday December 29 2013 10:58:21 EST Ventricular Rate:  87 PR Interval:  136 QRS Duration: 78 QT Interval:  380 QTC Calculation: 457 R Axis:   64 Text Interpretation:  Normal sinus rhythm Nonspecific ST and T wave  abnormality Abnormal ECG When compared with ECG of 01-Dec-2012 11:51, T  wave amplitude has decreased in Lateral leads Sinus rhythm T wave  abnormality Artifact Abnormal ekg Confirmed by Mariah Munch  MD [phone])  on 12/29/2013 12:56:09 PM        Update: Patient's labs reassuring, imaging reassuring.  Vital signs are stable.  Results discussed with the patient and family. MDM   This patient presents with one day of abdominal pain, sternal discomfort, nausea, vomiting, and diarrhea.  With the patient's history of abdominal surgery, obstruction was a consideration, though this seems less likely given the focal pain about the right side of the abdomen.  Patient's CT scan was reassuring.  Labs were reassuring.  Patient improved here substantially.  She remained  hemodynamically stable, and in no distress.  With return of labs, vitals, CT scan, patient was treated empirically for gastroesophageal etiology, discharged to follow up with primary care and gastroenterology.    I personally performed the services described in this documentation, which was scribed in my presence. The recorded information has been reviewed and is accurate.      Mariah Munch, MD 12/29/13 1407

## 2013-12-29 NOTE — ED Notes (Signed)
RN in to discharge pt, pt actively heaving and vomiting.  edp notified and ordered to hold discharge at this time.

## 2013-12-29 NOTE — Discharge Instructions (Signed)
As discussed, your evaluation today has been largely reassuring.  But, it is important that you monitor your condition carefully, and do not hesitate to return to the ED if you develop new, or concerning changes in your condition. ? ?Otherwise, please follow-up with your physician for appropriate ongoing care. ? ?

## 2013-12-29 NOTE — ED Notes (Signed)
Pt states CP, abdominal pain, vomiting and diarrhea began yesterday. Pt heaving on arrival.

## 2014-05-15 ENCOUNTER — Encounter (INDEPENDENT_AMBULATORY_CARE_PROVIDER_SITE_OTHER): Payer: Self-pay | Admitting: *Deleted

## 2014-06-12 ENCOUNTER — Ambulatory Visit (INDEPENDENT_AMBULATORY_CARE_PROVIDER_SITE_OTHER): Payer: Self-pay | Admitting: Internal Medicine

## 2014-12-11 DIAGNOSIS — S0990XA Unspecified injury of head, initial encounter: Secondary | ICD-10-CM | POA: Diagnosis not present

## 2014-12-27 ENCOUNTER — Emergency Department (HOSPITAL_COMMUNITY): Payer: Medicare Other

## 2014-12-27 ENCOUNTER — Emergency Department (HOSPITAL_COMMUNITY)
Admission: EM | Admit: 2014-12-27 | Discharge: 2014-12-27 | Disposition: A | Discharge status: Home / Self Care | Payer: Medicare Other | Attending: Emergency Medicine | Admitting: Emergency Medicine

## 2014-12-27 ENCOUNTER — Encounter (HOSPITAL_COMMUNITY): Payer: Self-pay

## 2014-12-27 DIAGNOSIS — Z8669 Personal history of other diseases of the nervous system and sense organs: Secondary | ICD-10-CM | POA: Diagnosis not present

## 2014-12-27 DIAGNOSIS — S31139A Puncture wound of abdominal wall without foreign body, unspecified quadrant without penetration into peritoneal cavity, initial encounter: Secondary | ICD-10-CM

## 2014-12-27 DIAGNOSIS — X72XXXA Intentional self-harm by handgun discharge, initial encounter: Secondary | ICD-10-CM | POA: Insufficient documentation

## 2014-12-27 DIAGNOSIS — Y998 Other external cause status: Secondary | ICD-10-CM | POA: Insufficient documentation

## 2014-12-27 DIAGNOSIS — S31101A Unspecified open wound of abdominal wall, left upper quadrant without penetration into peritoneal cavity, initial encounter: Secondary | ICD-10-CM | POA: Insufficient documentation

## 2014-12-27 DIAGNOSIS — Y9389 Activity, other specified: Secondary | ICD-10-CM | POA: Diagnosis not present

## 2014-12-27 DIAGNOSIS — S31109A Unspecified open wound of abdominal wall, unspecified quadrant without penetration into peritoneal cavity, initial encounter: Secondary | ICD-10-CM | POA: Diagnosis not present

## 2014-12-27 DIAGNOSIS — Z23 Encounter for immunization: Secondary | ICD-10-CM | POA: Insufficient documentation

## 2014-12-27 DIAGNOSIS — R0682 Tachypnea, not elsewhere classified: Secondary | ICD-10-CM | POA: Insufficient documentation

## 2014-12-27 DIAGNOSIS — F419 Anxiety disorder, unspecified: Secondary | ICD-10-CM | POA: Insufficient documentation

## 2014-12-27 DIAGNOSIS — S3991XA Unspecified injury of abdomen, initial encounter: Secondary | ICD-10-CM | POA: Diagnosis present

## 2014-12-27 DIAGNOSIS — S31609A Unspecified open wound of abdominal wall, unspecified quadrant with penetration into peritoneal cavity, initial encounter: Secondary | ICD-10-CM | POA: Diagnosis not present

## 2014-12-27 DIAGNOSIS — S2190XA Unspecified open wound of unspecified part of thorax, initial encounter: Secondary | ICD-10-CM | POA: Diagnosis not present

## 2014-12-27 DIAGNOSIS — R000 Tachycardia, unspecified: Secondary | ICD-10-CM | POA: Insufficient documentation

## 2014-12-27 DIAGNOSIS — T1490XA Injury, unspecified, initial encounter: Secondary | ICD-10-CM

## 2014-12-27 DIAGNOSIS — W3400XA Accidental discharge from unspecified firearms or gun, initial encounter: Secondary | ICD-10-CM

## 2014-12-27 DIAGNOSIS — S61002A Unspecified open wound of left thumb without damage to nail, initial encounter: Secondary | ICD-10-CM | POA: Diagnosis not present

## 2014-12-27 DIAGNOSIS — Y9289 Other specified places as the place of occurrence of the external cause: Secondary | ICD-10-CM | POA: Insufficient documentation

## 2014-12-27 DIAGNOSIS — S31104A Unspecified open wound of abdominal wall, left lower quadrant without penetration into peritoneal cavity, initial encounter: Secondary | ICD-10-CM | POA: Insufficient documentation

## 2014-12-27 DIAGNOSIS — S3690XA Unspecified injury of unspecified intra-abdominal organ, initial encounter: Secondary | ICD-10-CM | POA: Diagnosis not present

## 2014-12-27 HISTORY — DX: Post-traumatic stress disorder, unspecified: F43.10

## 2014-12-27 HISTORY — DX: Unspecified convulsions: R56.9

## 2014-12-27 LAB — COMPREHENSIVE METABOLIC PANEL
ALK PHOS: 29 U/L — AB (ref 39–117)
ALT: 12 U/L (ref 0–35)
ANION GAP: 7 (ref 5–15)
AST: 39 U/L — ABNORMAL HIGH (ref 0–37)
Albumin: 3.7 g/dL (ref 3.5–5.2)
BUN: 10 mg/dL (ref 6–23)
CO2: 23 mmol/L (ref 19–32)
Calcium: 8.9 mg/dL (ref 8.4–10.5)
Chloride: 106 mmol/L (ref 96–112)
Creatinine, Ser: 0.74 mg/dL (ref 0.50–1.10)
GLUCOSE: 89 mg/dL (ref 70–99)
POTASSIUM: 5.9 mmol/L — AB (ref 3.5–5.1)
SODIUM: 136 mmol/L (ref 135–145)
Total Bilirubin: 1.4 mg/dL — ABNORMAL HIGH (ref 0.3–1.2)
Total Protein: 6.5 g/dL (ref 6.0–8.3)

## 2014-12-27 LAB — CBC
HEMATOCRIT: 39.6 % (ref 36.0–46.0)
HEMOGLOBIN: 13.2 g/dL (ref 12.0–15.0)
MCH: 31.4 pg (ref 26.0–34.0)
MCHC: 33.3 g/dL (ref 30.0–36.0)
MCV: 94.1 fL (ref 78.0–100.0)
Platelets: 306 10*3/uL (ref 150–400)
RBC: 4.21 MIL/uL (ref 3.87–5.11)
RDW: 13.3 % (ref 11.5–15.5)
WBC: 9.7 10*3/uL (ref 4.0–10.5)

## 2014-12-27 LAB — TYPE AND SCREEN
ABO/RH(D): O POS
Antibody Screen: NEGATIVE
Unit division: 0
Unit division: 0

## 2014-12-27 LAB — PREPARE FRESH FROZEN PLASMA
Unit division: 0
Unit division: 0

## 2014-12-27 LAB — I-STAT CHEM 8, ED
BUN: 11 mg/dL (ref 6–23)
Calcium, Ion: 0.97 mmol/L — ABNORMAL LOW (ref 1.12–1.23)
Chloride: 107 mmol/L (ref 96–112)
Creatinine, Ser: 0.7 mg/dL (ref 0.50–1.10)
Glucose, Bld: 90 mg/dL (ref 70–99)
HEMATOCRIT: 45 % (ref 36.0–46.0)
Hemoglobin: 15.3 g/dL — ABNORMAL HIGH (ref 12.0–15.0)
Potassium: 4.1 mmol/L (ref 3.5–5.1)
SODIUM: 139 mmol/L (ref 135–145)
TCO2: 18 mmol/L (ref 0–100)

## 2014-12-27 LAB — CDS SEROLOGY

## 2014-12-27 LAB — ETHANOL

## 2014-12-27 LAB — ABO/RH: ABO/RH(D): O POS

## 2014-12-27 MED ORDER — OXYCODONE-ACETAMINOPHEN 5-325 MG PO TABS: 1.0000 | ORAL_TABLET | Freq: Four times a day (QID) | ORAL | Status: DC | PRN

## 2014-12-27 MED ORDER — IOHEXOL 300 MG/ML  SOLN
100.0000 mL | Freq: Once | INTRAMUSCULAR | Status: AC | PRN
  Administered 2014-12-27: 100 mL via INTRAVENOUS

## 2014-12-27 MED ORDER — LORAZEPAM 1 MG PO TABS: 1.0000 mg | ORAL_TABLET | Freq: Three times a day (TID) | ORAL | Status: DC | PRN

## 2014-12-27 MED ORDER — FENTANYL CITRATE 0.05 MG/ML IJ SOLN
INTRAMUSCULAR | Status: AC | PRN
  Administered 2014-12-27: 50 ug via INTRAVENOUS

## 2014-12-27 MED ORDER — LORAZEPAM 2 MG/ML IJ SOLN
INTRAMUSCULAR | Status: AC
  Filled 2014-12-27: qty 1

## 2014-12-27 MED ORDER — LORAZEPAM 2 MG/ML IJ SOLN
2.0000 mg | Freq: Once | INTRAMUSCULAR | Status: AC
  Administered 2014-12-27: 2 mg via INTRAMUSCULAR

## 2014-12-27 MED ORDER — SODIUM CHLORIDE 0.9 % IV SOLN
INTRAVENOUS | Status: AC | PRN
  Administered 2014-12-27: 1000 mL via INTRAVENOUS

## 2014-12-27 MED ORDER — FENTANYL CITRATE 0.05 MG/ML IJ SOLN
INTRAMUSCULAR | Status: AC
  Filled 2014-12-27: qty 2

## 2014-12-27 MED ORDER — HYDROMORPHONE HCL 1 MG/ML IJ SOLN
1.0000 mg | Freq: Once | INTRAMUSCULAR | Status: AC
  Administered 2014-12-27: 1 mg via INTRAMUSCULAR

## 2014-12-27 MED ORDER — HYDROMORPHONE HCL 1 MG/ML IJ SOLN
INTRAMUSCULAR | Status: AC
  Filled 2014-12-27: qty 1

## 2014-12-27 MED ORDER — CEFAZOLIN SODIUM 1-5 GM-% IV SOLN
1.0000 g | Freq: Once | INTRAVENOUS | Status: AC
  Administered 2014-12-27: 1 g via INTRAVENOUS

## 2014-12-27 MED ORDER — TETANUS-DIPHTH-ACELL PERTUSSIS 5-2.5-18.5 LF-MCG/0.5 IM SUSP
0.5000 mL | Freq: Once | INTRAMUSCULAR | Status: AC
  Administered 2014-12-27: 0.5 mL via INTRAMUSCULAR

## 2014-12-27 NOTE — ED Notes (Signed)
Pt remains monitored by blood pressure, pulse ox, and 5 lead.  

## 2014-12-27 NOTE — ED Notes (Signed)
Pt placed into paper scrubs to be discharged at home. Pt remains monitored by blood pressure and pulse ox.
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
## 2015-03-17 DIAGNOSIS — E871 Hypo-osmolality and hyponatremia: Secondary | ICD-10-CM | POA: Diagnosis not present

## 2015-03-17 DIAGNOSIS — S99922A Unspecified injury of left foot, initial encounter: Secondary | ICD-10-CM | POA: Diagnosis not present

## 2015-03-17 DIAGNOSIS — M6282 Rhabdomyolysis: Secondary | ICD-10-CM | POA: Diagnosis not present

## 2015-03-17 DIAGNOSIS — E860 Dehydration: Secondary | ICD-10-CM | POA: Diagnosis not present

## 2015-03-17 DIAGNOSIS — M79641 Pain in right hand: Secondary | ICD-10-CM | POA: Diagnosis not present

## 2015-03-17 DIAGNOSIS — R200 Anesthesia of skin: Secondary | ICD-10-CM | POA: Diagnosis not present

## 2015-03-17 DIAGNOSIS — M79672 Pain in left foot: Secondary | ICD-10-CM | POA: Diagnosis not present

## 2015-03-17 DIAGNOSIS — J9811 Atelectasis: Secondary | ICD-10-CM | POA: Diagnosis not present

## 2015-03-17 DIAGNOSIS — L03115 Cellulitis of right lower limb: Secondary | ICD-10-CM | POA: Diagnosis not present

## 2015-03-17 DIAGNOSIS — S99921A Unspecified injury of right foot, initial encounter: Secondary | ICD-10-CM | POA: Diagnosis not present

## 2015-03-17 DIAGNOSIS — I10 Essential (primary) hypertension: Secondary | ICD-10-CM | POA: Diagnosis not present

## 2015-03-17 DIAGNOSIS — S79921A Unspecified injury of right thigh, initial encounter: Secondary | ICD-10-CM | POA: Diagnosis not present

## 2015-03-17 DIAGNOSIS — G8929 Other chronic pain: Secondary | ICD-10-CM | POA: Diagnosis not present

## 2015-03-17 DIAGNOSIS — N179 Acute kidney failure, unspecified: Secondary | ICD-10-CM | POA: Diagnosis not present

## 2015-03-17 DIAGNOSIS — S8991XA Unspecified injury of right lower leg, initial encounter: Secondary | ICD-10-CM | POA: Diagnosis not present

## 2015-03-17 DIAGNOSIS — S6991XA Unspecified injury of right wrist, hand and finger(s), initial encounter: Secondary | ICD-10-CM | POA: Diagnosis not present

## 2015-03-17 DIAGNOSIS — S3992XA Unspecified injury of lower back, initial encounter: Secondary | ICD-10-CM | POA: Diagnosis not present

## 2015-03-17 DIAGNOSIS — R930 Abnormal findings on diagnostic imaging of skull and head, not elsewhere classified: Secondary | ICD-10-CM | POA: Diagnosis not present

## 2015-03-17 DIAGNOSIS — S3991XA Unspecified injury of abdomen, initial encounter: Secondary | ICD-10-CM | POA: Diagnosis not present

## 2015-03-26 DIAGNOSIS — R200 Anesthesia of skin: Secondary | ICD-10-CM | POA: Diagnosis not present

## 2015-03-26 DIAGNOSIS — E860 Dehydration: Secondary | ICD-10-CM | POA: Diagnosis not present

## 2015-03-26 DIAGNOSIS — G8929 Other chronic pain: Secondary | ICD-10-CM | POA: Diagnosis not present

## 2015-03-26 DIAGNOSIS — N179 Acute kidney failure, unspecified: Secondary | ICD-10-CM | POA: Diagnosis not present

## 2015-03-27 DIAGNOSIS — S79921A Unspecified injury of right thigh, initial encounter: Secondary | ICD-10-CM | POA: Diagnosis present

## 2015-03-27 DIAGNOSIS — S3991XA Unspecified injury of abdomen, initial encounter: Secondary | ICD-10-CM | POA: Diagnosis not present

## 2015-03-27 DIAGNOSIS — M79641 Pain in right hand: Secondary | ICD-10-CM | POA: Diagnosis not present

## 2015-03-27 DIAGNOSIS — G40909 Epilepsy, unspecified, not intractable, without status epilepticus: Secondary | ICD-10-CM | POA: Diagnosis present

## 2015-03-27 DIAGNOSIS — R51 Headache: Secondary | ICD-10-CM | POA: Diagnosis not present

## 2015-03-27 DIAGNOSIS — N281 Cyst of kidney, acquired: Secondary | ICD-10-CM | POA: Diagnosis not present

## 2015-03-27 DIAGNOSIS — I10 Essential (primary) hypertension: Secondary | ICD-10-CM | POA: Diagnosis present

## 2015-03-27 DIAGNOSIS — M79672 Pain in left foot: Secondary | ICD-10-CM | POA: Diagnosis not present

## 2015-03-27 DIAGNOSIS — G8921 Chronic pain due to trauma: Secondary | ICD-10-CM | POA: Diagnosis not present

## 2015-03-27 DIAGNOSIS — S3992XA Unspecified injury of lower back, initial encounter: Secondary | ICD-10-CM | POA: Diagnosis not present

## 2015-03-27 DIAGNOSIS — S8991XA Unspecified injury of right lower leg, initial encounter: Secondary | ICD-10-CM | POA: Diagnosis not present

## 2015-03-27 DIAGNOSIS — R930 Abnormal findings on diagnostic imaging of skull and head, not elsewhere classified: Secondary | ICD-10-CM | POA: Diagnosis not present

## 2015-03-27 DIAGNOSIS — Z888 Allergy status to other drugs, medicaments and biological substances status: Secondary | ICD-10-CM | POA: Diagnosis not present

## 2015-03-27 DIAGNOSIS — N179 Acute kidney failure, unspecified: Secondary | ICD-10-CM | POA: Diagnosis not present

## 2015-03-27 DIAGNOSIS — R569 Unspecified convulsions: Secondary | ICD-10-CM | POA: Diagnosis not present

## 2015-03-27 DIAGNOSIS — D72829 Elevated white blood cell count, unspecified: Secondary | ICD-10-CM | POA: Diagnosis present

## 2015-03-27 DIAGNOSIS — S6991XA Unspecified injury of right wrist, hand and finger(s), initial encounter: Secondary | ICD-10-CM | POA: Diagnosis not present

## 2015-03-27 DIAGNOSIS — G8929 Other chronic pain: Secondary | ICD-10-CM | POA: Diagnosis not present

## 2015-03-27 DIAGNOSIS — R600 Localized edema: Secondary | ICD-10-CM | POA: Diagnosis not present

## 2015-03-27 DIAGNOSIS — J9811 Atelectasis: Secondary | ICD-10-CM | POA: Diagnosis not present

## 2015-03-27 DIAGNOSIS — M6282 Rhabdomyolysis: Secondary | ICD-10-CM | POA: Diagnosis present

## 2015-03-27 DIAGNOSIS — S99921A Unspecified injury of right foot, initial encounter: Secondary | ICD-10-CM | POA: Diagnosis not present

## 2015-03-27 DIAGNOSIS — Z79899 Other long term (current) drug therapy: Secondary | ICD-10-CM | POA: Diagnosis not present

## 2015-03-27 DIAGNOSIS — M545 Low back pain: Secondary | ICD-10-CM | POA: Diagnosis not present

## 2015-03-27 DIAGNOSIS — S0990XA Unspecified injury of head, initial encounter: Secondary | ICD-10-CM | POA: Diagnosis not present

## 2015-03-27 DIAGNOSIS — S99922A Unspecified injury of left foot, initial encounter: Secondary | ICD-10-CM | POA: Diagnosis not present

## 2015-03-27 DIAGNOSIS — G894 Chronic pain syndrome: Secondary | ICD-10-CM | POA: Diagnosis present

## 2015-03-27 DIAGNOSIS — E876 Hypokalemia: Secondary | ICD-10-CM | POA: Diagnosis not present

## 2015-03-27 DIAGNOSIS — Z8673 Personal history of transient ischemic attack (TIA), and cerebral infarction without residual deficits: Secondary | ICD-10-CM | POA: Diagnosis not present

## 2015-03-27 DIAGNOSIS — E860 Dehydration: Secondary | ICD-10-CM | POA: Diagnosis not present

## 2015-03-27 DIAGNOSIS — M25551 Pain in right hip: Secondary | ICD-10-CM | POA: Diagnosis not present

## 2015-03-27 DIAGNOSIS — L03115 Cellulitis of right lower limb: Secondary | ICD-10-CM | POA: Diagnosis present

## 2015-03-27 DIAGNOSIS — E871 Hypo-osmolality and hyponatremia: Secondary | ICD-10-CM | POA: Diagnosis present

## 2015-03-27 DIAGNOSIS — L0390 Cellulitis, unspecified: Secondary | ICD-10-CM | POA: Diagnosis not present

## 2015-03-27 DIAGNOSIS — E875 Hyperkalemia: Secondary | ICD-10-CM | POA: Diagnosis not present

## 2015-03-27 DIAGNOSIS — W19XXXA Unspecified fall, initial encounter: Secondary | ICD-10-CM | POA: Diagnosis not present

## 2015-03-27 DIAGNOSIS — G500 Trigeminal neuralgia: Secondary | ICD-10-CM | POA: Diagnosis not present

## 2015-04-14 DIAGNOSIS — S82831S Other fracture of upper and lower end of right fibula, sequela: Secondary | ICD-10-CM | POA: Diagnosis not present

## 2015-04-14 DIAGNOSIS — M25551 Pain in right hip: Secondary | ICD-10-CM | POA: Diagnosis not present

## 2015-04-14 DIAGNOSIS — R110 Nausea: Secondary | ICD-10-CM | POA: Diagnosis not present

## 2015-04-14 DIAGNOSIS — M25571 Pain in right ankle and joints of right foot: Secondary | ICD-10-CM | POA: Diagnosis not present

## 2015-04-14 DIAGNOSIS — M25569 Pain in unspecified knee: Secondary | ICD-10-CM | POA: Diagnosis not present

## 2015-04-14 DIAGNOSIS — M545 Low back pain: Secondary | ICD-10-CM | POA: Diagnosis not present

## 2015-05-23 DIAGNOSIS — G40309 Generalized idiopathic epilepsy and epileptic syndromes, not intractable, without status epilepticus: Secondary | ICD-10-CM | POA: Diagnosis not present

## 2015-05-23 DIAGNOSIS — G500 Trigeminal neuralgia: Secondary | ICD-10-CM | POA: Diagnosis not present

## 2015-06-04 DIAGNOSIS — G40309 Generalized idiopathic epilepsy and epileptic syndromes, not intractable, without status epilepticus: Secondary | ICD-10-CM | POA: Diagnosis not present

## 2015-06-04 DIAGNOSIS — G500 Trigeminal neuralgia: Secondary | ICD-10-CM | POA: Diagnosis not present

## 2015-06-18 ENCOUNTER — Encounter (HOSPITAL_COMMUNITY): Payer: Self-pay | Admitting: Emergency Medicine

## 2015-07-10 DIAGNOSIS — G40909 Epilepsy, unspecified, not intractable, without status epilepticus: Secondary | ICD-10-CM | POA: Diagnosis not present

## 2015-07-10 DIAGNOSIS — G500 Trigeminal neuralgia: Secondary | ICD-10-CM | POA: Diagnosis not present

## 2015-08-11 DIAGNOSIS — J029 Acute pharyngitis, unspecified: Secondary | ICD-10-CM | POA: Diagnosis not present

## 2015-08-11 DIAGNOSIS — J209 Acute bronchitis, unspecified: Secondary | ICD-10-CM | POA: Diagnosis not present

## 2015-08-11 DIAGNOSIS — R109 Unspecified abdominal pain: Secondary | ICD-10-CM | POA: Diagnosis not present

## 2015-08-11 DIAGNOSIS — J019 Acute sinusitis, unspecified: Secondary | ICD-10-CM | POA: Diagnosis not present

## 2015-08-11 DIAGNOSIS — R0600 Dyspnea, unspecified: Secondary | ICD-10-CM | POA: Diagnosis not present

## 2015-08-11 DIAGNOSIS — R9431 Abnormal electrocardiogram [ECG] [EKG]: Secondary | ICD-10-CM | POA: Diagnosis not present

## 2015-08-11 DIAGNOSIS — Z0131 Encounter for examination of blood pressure with abnormal findings: Secondary | ICD-10-CM | POA: Diagnosis not present

## 2015-08-24 DIAGNOSIS — Z79899 Other long term (current) drug therapy: Secondary | ICD-10-CM | POA: Diagnosis not present

## 2015-08-24 DIAGNOSIS — M25571 Pain in right ankle and joints of right foot: Secondary | ICD-10-CM | POA: Diagnosis not present

## 2015-08-24 DIAGNOSIS — R739 Hyperglycemia, unspecified: Secondary | ICD-10-CM | POA: Diagnosis not present

## 2015-08-24 DIAGNOSIS — R2241 Localized swelling, mass and lump, right lower limb: Secondary | ICD-10-CM | POA: Diagnosis not present

## 2015-08-24 DIAGNOSIS — Z8673 Personal history of transient ischemic attack (TIA), and cerebral infarction without residual deficits: Secondary | ICD-10-CM | POA: Diagnosis not present

## 2015-08-24 DIAGNOSIS — Z8781 Personal history of (healed) traumatic fracture: Secondary | ICD-10-CM | POA: Diagnosis not present

## 2015-08-24 DIAGNOSIS — I10 Essential (primary) hypertension: Secondary | ICD-10-CM | POA: Diagnosis not present

## 2015-10-01 DIAGNOSIS — R51 Headache: Secondary | ICD-10-CM | POA: Diagnosis not present

## 2015-10-01 DIAGNOSIS — M542 Cervicalgia: Secondary | ICD-10-CM | POA: Diagnosis not present

## 2015-10-01 DIAGNOSIS — Z8673 Personal history of transient ischemic attack (TIA), and cerebral infarction without residual deficits: Secondary | ICD-10-CM | POA: Diagnosis not present

## 2015-10-01 DIAGNOSIS — Z79899 Other long term (current) drug therapy: Secondary | ICD-10-CM | POA: Diagnosis not present

## 2015-10-01 DIAGNOSIS — I10 Essential (primary) hypertension: Secondary | ICD-10-CM | POA: Diagnosis not present

## 2015-10-05 DIAGNOSIS — R569 Unspecified convulsions: Secondary | ICD-10-CM | POA: Diagnosis not present

## 2015-10-05 DIAGNOSIS — Z79899 Other long term (current) drug therapy: Secondary | ICD-10-CM | POA: Diagnosis not present

## 2015-10-05 DIAGNOSIS — N281 Cyst of kidney, acquired: Secondary | ICD-10-CM | POA: Diagnosis not present

## 2015-10-05 DIAGNOSIS — N23 Unspecified renal colic: Secondary | ICD-10-CM | POA: Diagnosis not present

## 2015-10-05 DIAGNOSIS — R1110 Vomiting, unspecified: Secondary | ICD-10-CM | POA: Diagnosis not present

## 2015-10-05 DIAGNOSIS — K8050 Calculus of bile duct without cholangitis or cholecystitis without obstruction: Secondary | ICD-10-CM | POA: Diagnosis not present

## 2015-10-05 DIAGNOSIS — Z8673 Personal history of transient ischemic attack (TIA), and cerebral infarction without residual deficits: Secondary | ICD-10-CM | POA: Diagnosis not present

## 2015-10-05 DIAGNOSIS — I10 Essential (primary) hypertension: Secondary | ICD-10-CM | POA: Diagnosis not present

## 2015-11-17 DIAGNOSIS — G500 Trigeminal neuralgia: Secondary | ICD-10-CM | POA: Diagnosis not present

## 2015-11-17 DIAGNOSIS — S01451A Open bite of right cheek and temporomandibular area, initial encounter: Secondary | ICD-10-CM | POA: Diagnosis not present

## 2015-11-17 DIAGNOSIS — S01452A Open bite of left cheek and temporomandibular area, initial encounter: Secondary | ICD-10-CM | POA: Diagnosis not present

## 2015-11-17 DIAGNOSIS — Z8669 Personal history of other diseases of the nervous system and sense organs: Secondary | ICD-10-CM | POA: Diagnosis not present

## 2015-11-17 DIAGNOSIS — M26603 Bilateral temporomandibular joint disorder, unspecified: Secondary | ICD-10-CM | POA: Diagnosis not present

## 2015-11-17 DIAGNOSIS — Y9389 Activity, other specified: Secondary | ICD-10-CM | POA: Diagnosis not present

## 2015-11-17 DIAGNOSIS — I10 Essential (primary) hypertension: Secondary | ICD-10-CM | POA: Diagnosis not present

## 2015-11-17 DIAGNOSIS — Z9889 Other specified postprocedural states: Secondary | ICD-10-CM | POA: Diagnosis not present

## 2015-11-17 DIAGNOSIS — Z886 Allergy status to analgesic agent status: Secondary | ICD-10-CM | POA: Diagnosis not present

## 2015-11-17 DIAGNOSIS — Z791 Long term (current) use of non-steroidal anti-inflammatories (NSAID): Secondary | ICD-10-CM | POA: Diagnosis not present

## 2015-11-17 DIAGNOSIS — Z79899 Other long term (current) drug therapy: Secondary | ICD-10-CM | POA: Diagnosis not present

## 2015-11-17 DIAGNOSIS — M26602 Left temporomandibular joint disorder, unspecified: Secondary | ICD-10-CM | POA: Diagnosis not present

## 2015-11-17 DIAGNOSIS — F4310 Post-traumatic stress disorder, unspecified: Secondary | ICD-10-CM | POA: Diagnosis not present

## 2016-01-01 DIAGNOSIS — R112 Nausea with vomiting, unspecified: Secondary | ICD-10-CM | POA: Diagnosis not present

## 2016-01-01 DIAGNOSIS — Z79899 Other long term (current) drug therapy: Secondary | ICD-10-CM | POA: Diagnosis not present

## 2016-01-01 DIAGNOSIS — Z8673 Personal history of transient ischemic attack (TIA), and cerebral infarction without residual deficits: Secondary | ICD-10-CM | POA: Diagnosis not present

## 2016-01-01 DIAGNOSIS — R079 Chest pain, unspecified: Secondary | ICD-10-CM | POA: Diagnosis present

## 2016-01-01 DIAGNOSIS — F329 Major depressive disorder, single episode, unspecified: Secondary | ICD-10-CM | POA: Diagnosis present

## 2016-01-01 DIAGNOSIS — Z888 Allergy status to other drugs, medicaments and biological substances status: Secondary | ICD-10-CM | POA: Diagnosis not present

## 2016-01-01 DIAGNOSIS — G894 Chronic pain syndrome: Secondary | ICD-10-CM | POA: Diagnosis not present

## 2016-01-01 DIAGNOSIS — I160 Hypertensive urgency: Secondary | ICD-10-CM | POA: Diagnosis present

## 2016-01-01 DIAGNOSIS — R000 Tachycardia, unspecified: Secondary | ICD-10-CM | POA: Diagnosis not present

## 2016-01-01 DIAGNOSIS — I10 Essential (primary) hypertension: Secondary | ICD-10-CM | POA: Diagnosis not present

## 2016-01-01 DIAGNOSIS — E785 Hyperlipidemia, unspecified: Secondary | ICD-10-CM | POA: Diagnosis present

## 2016-01-01 DIAGNOSIS — G40409 Other generalized epilepsy and epileptic syndromes, not intractable, without status epilepticus: Secondary | ICD-10-CM | POA: Diagnosis not present

## 2016-01-26 DIAGNOSIS — G4089 Other seizures: Secondary | ICD-10-CM | POA: Diagnosis not present

## 2016-01-26 DIAGNOSIS — G43711 Chronic migraine without aura, intractable, with status migrainosus: Secondary | ICD-10-CM | POA: Diagnosis not present

## 2016-01-26 DIAGNOSIS — G500 Trigeminal neuralgia: Secondary | ICD-10-CM | POA: Diagnosis not present

## 2016-01-26 DIAGNOSIS — F419 Anxiety disorder, unspecified: Secondary | ICD-10-CM | POA: Diagnosis not present

## 2016-01-26 DIAGNOSIS — M62838 Other muscle spasm: Secondary | ICD-10-CM | POA: Diagnosis not present

## 2016-01-26 DIAGNOSIS — R1084 Generalized abdominal pain: Secondary | ICD-10-CM | POA: Diagnosis not present

## 2016-05-24 ENCOUNTER — Emergency Department (HOSPITAL_COMMUNITY): Payer: Medicare Other

## 2016-05-24 ENCOUNTER — Encounter (HOSPITAL_COMMUNITY): Payer: Self-pay | Admitting: Emergency Medicine

## 2016-05-24 ENCOUNTER — Emergency Department (HOSPITAL_COMMUNITY)
Admission: EM | Admit: 2016-05-24 | Discharge: 2016-05-24 | Disposition: A | Discharge status: Home / Self Care | Payer: Medicare Other | Attending: Emergency Medicine | Admitting: Emergency Medicine

## 2016-05-24 DIAGNOSIS — Z8673 Personal history of transient ischemic attack (TIA), and cerebral infarction without residual deficits: Secondary | ICD-10-CM | POA: Insufficient documentation

## 2016-05-24 DIAGNOSIS — F329 Major depressive disorder, single episode, unspecified: Secondary | ICD-10-CM | POA: Diagnosis not present

## 2016-05-24 DIAGNOSIS — Y939 Activity, unspecified: Secondary | ICD-10-CM | POA: Diagnosis not present

## 2016-05-24 DIAGNOSIS — Y999 Unspecified external cause status: Secondary | ICD-10-CM | POA: Insufficient documentation

## 2016-05-24 DIAGNOSIS — Y929 Unspecified place or not applicable: Secondary | ICD-10-CM | POA: Diagnosis not present

## 2016-05-24 DIAGNOSIS — G40909 Epilepsy, unspecified, not intractable, without status epilepticus: Secondary | ICD-10-CM | POA: Insufficient documentation

## 2016-05-24 DIAGNOSIS — E785 Hyperlipidemia, unspecified: Secondary | ICD-10-CM | POA: Insufficient documentation

## 2016-05-24 DIAGNOSIS — Z79899 Other long term (current) drug therapy: Secondary | ICD-10-CM | POA: Insufficient documentation

## 2016-05-24 DIAGNOSIS — S82891A Other fracture of right lower leg, initial encounter for closed fracture: Secondary | ICD-10-CM

## 2016-05-24 DIAGNOSIS — S82301A Unspecified fracture of lower end of right tibia, initial encounter for closed fracture: Secondary | ICD-10-CM | POA: Diagnosis not present

## 2016-05-24 DIAGNOSIS — W1839XA Other fall on same level, initial encounter: Secondary | ICD-10-CM | POA: Diagnosis not present

## 2016-05-24 DIAGNOSIS — I10 Essential (primary) hypertension: Secondary | ICD-10-CM | POA: Insufficient documentation

## 2016-05-24 DIAGNOSIS — S99911A Unspecified injury of right ankle, initial encounter: Secondary | ICD-10-CM | POA: Diagnosis present

## 2016-05-24 DIAGNOSIS — S82831A Other fracture of upper and lower end of right fibula, initial encounter for closed fracture: Secondary | ICD-10-CM | POA: Insufficient documentation

## 2016-05-24 DIAGNOSIS — S8251XA Displaced fracture of medial malleolus of right tibia, initial encounter for closed fracture: Secondary | ICD-10-CM | POA: Diagnosis not present

## 2016-05-24 MED ORDER — HYDROMORPHONE HCL 1 MG/ML IJ SOLN
1.0000 mg | Freq: Once | INTRAMUSCULAR | Status: AC
  Administered 2016-05-24: 1 mg via INTRAMUSCULAR
  Filled 2016-05-24: qty 1

## 2016-05-24 NOTE — ED Notes (Signed)
Pt carried to truck in wheelchair & assisted to vehicle. Pt left w/ no further questions.

## 2016-05-24 NOTE — ED Notes (Signed)
Pt states she had a whole body twitch & fell to floor. Pt states she heard something pop & foot was turned to the side.

## 2016-05-24 NOTE — ED Triage Notes (Signed)
Pt states while she was having a seizure she felt her right ankle crack.

## 2016-05-24 NOTE — ED Triage Notes (Signed)
Pt states she had a seizure and fell. Pt states when she came to her right ankle was turned the wrong way. Pt is c/o right ankle pain.

## 2016-05-24 NOTE — Discharge Instructions (Signed)
You were seen in the ED today with an ankle fracture. Follow up with the orthopedist in the coming week but call for an appointment tomorrow.   Return to the ED with any foot numbness, sudden worsening pain, fever, or other concerning signs or symptoms.

## 2016-05-24 NOTE — ED Provider Notes (Signed)
Emergency Department Provider Note  Time seen: Approximately 7:32 PM  I have reviewed the triage vital signs and the nursing notes.   HISTORY  Chief Complaint Seizures and Ankle Pain   HPI Mariah Ibarra is a 45 y.o. female with PMH of seizure disorders with multiple breakthrough seizures per month, HTN, HLD, and trigeminal neuralgia presents to the emergency department for evaluation of right ankle and knee pain in the setting of brief breakthrough seizure today. The patient's family member at bedside states that she had a brief episode of full body twitching. Patient felt as if she was going to have a seizure and try to get herself to the ground. In the process and because of the twitching she fell to the ground rolling her right ankle as she fell. No head trauma. The seizure quickly resolved and the patient returned her mental status baseline but now with significant pain in her right ankle and knee. Pain is worse with movement. Patient takes Dilaudid at home for pain but took nothing prior to arrival.   Past Medical History:  Diagnosis Date  . Anxiety   . Hypercholesteremia   . Hypertension   . Panic attacks   . PTSD (post-traumatic stress disorder)   . Seizures (HCC)   . Stroke (HCC)   . Trigeminal neuralgia     Patient Active Problem List   Diagnosis Date Noted  . Thrush 12/03/2012  . Otitis externa, left 12/03/2012  . SIADH (syndrome of inappropriate ADH production) (HCC) 12/02/2012  . Hyponatremia 12/01/2012  . Seizure (HCC) 12/01/2012  . High anion gap metabolic acidosis 12/01/2012  . Hypokalemia 12/01/2012  . Depression 12/01/2012  . Anxiety 12/01/2012  . Ataxia 08/24/2012  . Trigeminal neuralgia 08/24/2012  . Hypertension 08/24/2012  . Hyperlipidemia 08/24/2012    Past Surgical History:  Procedure Laterality Date  . ABDOMINOPLASTY    . APPENDECTOMY    . bladder tack    . BRAIN SURGERY    . CERVICAL ABLATION    . Gamma knife Procedure November 2013     . TONSILLECTOMY      Current Outpatient Rx  . Order #: [id number] Class: Historical Med  . Order #: [id number] Class: Historical Med  . Order #: [id number] Class: Historical Med  . Order #: [id number] Class: Print  . Order #: [id number] Class: Historical Med  . Order #: [id number] Class: Print  . Order #: [id number] Class: Historical Med  . Order #: [id number] Class: Print  . Order #: [id number] Class: Historical Med  . Order #: [id number] Class: Print  . Order #: [id number] Class: Print  . Order #: [id number] Class: Historical Med  . Order #: [id number] Class: Print  . Order #: [id number] Class: Historical Med  . Order #: [id number] Class: Historical Med  . Order #: [id number] Class: Historical Med    Allergies Baclofen and Lorazepam  Family History  Problem Relation Age of Onset  . Depression Paternal Aunt   . Depression Paternal Uncle   . Depression Maternal Grandfather     Social History Social History  Substance Use Topics  . Smoking status: Never Smoker  . Smokeless tobacco: Never Used  . Alcohol use No     Comment: occ    Review of Systems  Constitutional: No fever/chills Eyes: No visual changes. ENT: No sore throat. Cardiovascular: Denies chest pain. Respiratory: Denies shortness of breath. Gastrointestinal: No abdominal pain.  No nausea, no vomiting.  No diarrhea.  No constipation. Genitourinary: Negative for dysuria. Musculoskeletal: Negative for back pain. Severe right ankle pain and knee pain.  Skin: Negative  for rash. Neurological: Negative for headaches, focal weakness or numbness. Positive breakthrough seizure.   10-point ROS otherwise negative.  ____________________________________________   PHYSICAL EXAM:  VITAL SIGNS: ED Triage Vitals  Enc Vitals Group     BP 05/24/16 1906 (!) 80/56     Pulse Rate 05/24/16 1906 88     Resp 05/24/16 1906 (!) 30     Temp 05/24/16 1906 98.4 F (36.9 C)     Temp Source 05/24/16 1906 Temporal     SpO2 05/24/16 1906 99 %     Weight  05/24/16 1906 185 lb (83.9 kg)     Height 05/24/16 1906 5\' 1"  (1.549 m)     Pain Score 05/24/16 1901 10    Constitutional: Alert and oriented. Well appearing and in no acute distress. Eyes: Conjunctivae are normal. PERRL. EOMI. Head: Atraumatic. Nose: No congestion/rhinnorhea. Mouth/Throat: Mucous membranes are moist.  Oropharynx non-erythematous. Neck: No stridor.  No meningeal signs.   Cardiovascular: Normal rate, regular rhythm. Good peripheral circulation. Grossly normal heart sounds.   Respiratory: Normal respiratory effort.  No retractions. Lungs CTAB. Gastrointestinal: Soft and nontender. No distention.  Musculoskeletal: Severe tenderness to palpation of the right ankle over both medial and lateral malleoli. Patient also with severe lateral knee pain to palpation. Normal pulses and sensation to light touch. Compartments are soft.  Neurologic:  Normal speech and language. No gross focal neurologic deficits are appreciated.  Skin:  Skin is warm, dry and intact. No rash noted. Psychiatric: Mood and affect are normal. Speech and behavior are normal.  ____________________________________________  RADIOLOGY  Dg Knee 2 Views Right  Result Date: 05/24/2016 CLINICAL DATA:  Recent fall during seizure with distal tibial and fibular fractures EXAM: RIGHT KNEE - 1-2 VIEW COMPARISON:  None. FINDINGS: No acute fracture or dislocation is noted. No soft tissue abnormality is seen. IMPRESSION: No acute abnormality noted. Electronically Signed   By: Alcide Clever M.D.   On: 05/24/2016 19:58  Dg Ankle Complete Right  Result Date: 05/24/2016 CLINICAL DATA:  Pt states while she was having a seizure she felt her right ankle crack. Pt held for films EXAM: RIGHT ANKLE - COMPLETE 3+ VIEW COMPARISON:  None. FINDINGS: There are right ankle fractures. There is a comminuted fracture of the distal fibula which extends from the distal diaphysis to the posterior aspect of the metaphysis. The major fracture  components are displaced approximately 5 mm. There is also a nondisplaced fracture across the base of the medial malleolus, without comminution. No ankle dislocation. Mild lateral tilt of the talus in relation to the distal tibia. There is diffuse surrounding soft tissue swelling. IMPRESSION: 1. Fractures of the distal fibula and across the base of the medial malleolus. No dislocation. Electronically Signed   By: Amie Portland M.D.   On: 05/24/2016 19:23   ____________________________________________   PROCEDURES  Procedure(s) performed:   Procedures  None ____________________________________________   INITIAL IMPRESSION / ASSESSMENT AND PLAN / ED COURSE  Pertinent labs & imaging results that were available during my care of the patient were reviewed by me and considered in my medical decision making (see chart for details).  Patient presents to the emergency department for evaluation of breakthrough seizure, which is typical for her, with severe right ankle and knee pain. The patient has a distal fibula fracture and fracture of the medial malleolus. Will follow plain films of the right knee. We'll give IM Dilaudid for pain control. Patient reports being compliant with her seizure medications. She states is  typical for her to have several breakthrough seizures a month. No infection symptoms or clear indication for workup of breakthrough seizures at this time.   08:12 PM Discussed case with orthopedic surgery Dr. Romeo Apple. Will apply a posterior mold splint with stirrups. Updated patient and reviewed available imaging. We'll give additional intramuscular pain medication prior to splint placement. The patient artery takes oral Dilaudid at home. I instructed her to continue this for her acute pain and that splinting and immobilization should help with acute discomfort. She will call Dr. Mort Sawyers office in the morning to schedule outpatient follow-up.   Patient's pain is well-controlled with  IM dilaudid. She has oral Dilaudid at home which she will take for acute pain management. Assessed the patient's extremity after splinting. Discussed strict return precautions with the patient and also discussed the plan to follow up with orthopedics in the coming week.  ____________________________________________  FINAL CLINICAL IMPRESSION(S) / ED DIAGNOSES  Final diagnoses:  Ankle fracture, right, closed, initial encounter  Seizure disorder [facility])     MEDICATIONS GIVEN DURING THIS VISIT:  Medications  HYDROmorphone (DILAUDID) injection 1 mg (1 mg Intramuscular Given 05/24/16 1936)  HYDROmorphone (DILAUDID) injection 1 mg (1 mg Intramuscular Given 05/24/16 2019)     NEW OUTPATIENT MEDICATIONS STARTED DURING THIS VISIT:  None   Note:  This document was prepared using Dragon voice recognition software and may include unintentional dictation errors.  Alona Bene, MD Emergency Medicine   Maia Plan, MD 05/24/16 [phone]

## 2016-05-26 ENCOUNTER — Encounter: Payer: Self-pay | Admitting: *Deleted

## 2016-05-26 ENCOUNTER — Ambulatory Visit (INDEPENDENT_AMBULATORY_CARE_PROVIDER_SITE_OTHER): Payer: Medicare Other | Admitting: Orthopedic Surgery

## 2016-05-26 ENCOUNTER — Ambulatory Visit (HOSPITAL_COMMUNITY)
Admission: RE | Admit: 2016-05-26 | Discharge: 2016-05-26 | Disposition: A | Discharge status: Home / Self Care | Payer: Medicare Other | Source: Ambulatory Visit | Attending: Orthopedic Surgery | Admitting: Orthopedic Surgery

## 2016-05-26 VITALS — BP 88/61 | HR 108 | Ht 60.0 in | Wt 185.0 lb

## 2016-05-26 DIAGNOSIS — S82851A Displaced trimalleolar fracture of right lower leg, initial encounter for closed fracture: Secondary | ICD-10-CM | POA: Diagnosis not present

## 2016-05-26 DIAGNOSIS — S82841D Displaced bimalleolar fracture of right lower leg, subsequent encounter for closed fracture with routine healing: Secondary | ICD-10-CM | POA: Diagnosis not present

## 2016-05-26 DIAGNOSIS — S82891A Other fracture of right lower leg, initial encounter for closed fracture: Secondary | ICD-10-CM

## 2016-05-26 DIAGNOSIS — S82831D Other fracture of upper and lower end of right fibula, subsequent encounter for closed fracture with routine healing: Secondary | ICD-10-CM | POA: Insufficient documentation

## 2016-05-26 DIAGNOSIS — S82301D Unspecified fracture of lower end of right tibia, subsequent encounter for closed fracture with routine healing: Secondary | ICD-10-CM | POA: Diagnosis not present

## 2016-05-26 DIAGNOSIS — S82891D Other fracture of right lower leg, subsequent encounter for closed fracture with routine healing: Secondary | ICD-10-CM | POA: Diagnosis present

## 2016-05-26 DIAGNOSIS — X58XXXD Exposure to other specified factors, subsequent encounter: Secondary | ICD-10-CM | POA: Diagnosis not present

## 2016-05-27 NOTE — Progress Notes (Addendum)
Chief Complaint  Patient presents with  . New Patient (Initial Visit)    Fractured Right TIB FIB DOI 05/24/16   HPI 45 year old female with history of seizure disorder, posttraumatic stress syndrome and trigeminal neuralgia thought she was having a seizure, 2 days ago,she  tried to go down to the ground slipped and fell injured her right ankle and sustained a fracture of the right ankle and presents for evaluation and treatment  Complains of severe pain swelling on the medial and lateral aspects of the ankle. She said her foot was initially severely malaligned and her husband realigned it she went to the hospital for x-rays. Those x-rays are inconclusive regarding the nature of this injury she was sent back to the hospital for repeat films  Those repeat films show a comminuted fracture of the lateral malleolus transverse displaced fracture of the medial malleolus an avulsion fracture of the posterior malleolus with subluxation of talus  Review of Systems  Constitutional: Negative for chills, fever and malaise/fatigue.  HENT: Positive for ear pain. Negative for nosebleeds.   Eyes: Negative for pain.  Respiratory: Negative for shortness of breath.   Cardiovascular: Negative for chest pain.  Gastrointestinal: Negative for heartburn.  Genitourinary: Negative for hematuria.  Musculoskeletal: Positive for falls.  Skin: Negative for itching and rash.  Neurological: Negative for focal weakness and headaches.  Endo/Heme/Allergies: Negative for environmental allergies and polydipsia. Does not bruise/bleed easily.  Psychiatric/Behavioral: Negative.     Past Medical History:  Diagnosis Date  . Anxiety   . Hypercholesteremia   . Hypertension   . Panic attacks   . PTSD (post-traumatic stress disorder)   . Seizures (HCC)   . Stroke (HCC)   . Trigeminal neuralgia     Past Surgical History:  Procedure Laterality Date  . ABDOMINOPLASTY    . APPENDECTOMY    . bladder tack    . BRAIN SURGERY     . CERVICAL ABLATION    . Gamma knife Procedure November 2013    . TONSILLECTOMY     Family History  Problem Relation Age of Onset  . Depression Paternal Aunt   . Depression Paternal Uncle   . Depression Maternal Grandfather    Social History  Substance Use Topics  . Smoking status: Never Smoker  . Smokeless tobacco: Never Used  . Alcohol use No     Comment: occ    Current Outpatient Prescriptions:  .  cholecalciferol (GNP VITAMIN D) 400 UNITS TABS, Take 1 tablet by mouth Daily., Disp: , Rfl:  .  gabapentin (NEURONTIN) 600 MG tablet, Take 600 mg by mouth 4 (four) times daily. , Disp: , Rfl:  .  HYDROmorphone (DILAUDID) 2 MG tablet, Take 1 tablet (2 mg total) by mouth every 6 (six) hours as needed for moderate pain or severe pain. (Patient taking differently: Take 4 mg by mouth every 6 (six) hours as needed for moderate pain or severe pain. ), Disp: 15 tablet, Rfl: 0 .  ibuprofen (ADVIL,MOTRIN) 800 MG tablet, Take 800 mg by mouth 3 (three) times daily. , Disp: , Rfl:  .  levETIRAcetam (KEPPRA) 500 MG tablet, Take 1,500 mg by mouth 3 (three) times daily. , Disp: , Rfl:  .  lisinopril (PRINIVIL) 20 MG tablet, Take 1 tablet (20 mg total) by mouth daily., Disp: 30 tablet, Rfl: 0 .  ondansetron (ZOFRAN ODT) 4 MG disintegrating tablet, Take 1 tablet (4 mg total) by mouth every 8 (eight) hours as needed for nausea or vomiting., Disp: 20 tablet,  Rfl: 0 .  polyvinyl alcohol-povidone (REFRESH) 1.4-0.6 % ophthalmic solution, Apply 1-2 drops to eye daily as needed (for dry eye relief)., Disp: , Rfl:  .  tiZANidine (ZANAFLEX) 4 MG tablet, Take 4 mg by mouth 4 (four) times daily., Disp: , Rfl:  .  zolpidem (AMBIEN) 5 MG tablet, Take 5 mg by mouth at bedtime as needed. Sleep , Disp: , Rfl:   BP (!) 88/61   Pulse (!) 108   Ht 5' (1.524 m)   Wt 185 lb (83.9 kg)   BMI 36.13 kg/m   Physical Exam  Constitutional: She is oriented to person, place, and time. She appears well-developed and  well-nourished. She appears distressed.  HENT:  Head: Normocephalic and atraumatic.  Right Ear: External ear normal.  Left Ear: External ear normal.  Eyes: Conjunctivae and EOM are normal. Pupils are equal, round, and reactive to light. Right eye exhibits no discharge. Left eye exhibits no discharge. No scleral icterus.  Neck: Normal range of motion. Neck supple. No tracheal deviation present. No thyromegaly present.  Cardiovascular: Normal rate, regular rhythm and intact distal pulses.   Pulmonary/Chest: Effort normal. No respiratory distress. She has no wheezes. She has no rales. She exhibits no tenderness.  Abdominal: She exhibits no distension and no mass. There is no guarding. No hernia.  Lymphadenopathy:    She has no cervical adenopathy.  Neurological: She is alert and oriented to person, place, and time. She has normal reflexes. She exhibits normal muscle tone.  Skin: Skin is warm and dry. Capillary refill takes less than 2 seconds. No rash noted. She is not diaphoretic. No erythema. No pallor.  Psychiatric: She has a normal mood and affect. Her behavior is normal. Judgment and thought content normal.   coordination could not be assessed because the patient cannot stand up but her upper extremity finger point-2-point were normal  Ortho Exam Right and left upper extremity inspection reveals no abnormalities. There is no evidence of joint subluxation contracture subluxation atrophy tremor or joint luxation or muscle atrophy or increased muscle tone. Skin in sensation are normal good pulses and color are noted  Left lower extremity normal alignment normal motion normal strength normal stability normal skin normal pulse normal sensation  Right lower extremity the cast/splint had to be removed. The ankle mortise was displaced. We had to do a closed reduction and re-application of splint.    ASSESSMENT: My personal interpretation of the images:  First set of films were not readable to  determine surgery and I sent her back for repeat films the first set of films show a fracture and did not show the ankle mortise. The fracture involves the lateral medial malleolus but cannot be defined  Second set of films show lateral ankle mortise subluxation avulsion posterior malleolus fracture transverse medial malleolus comminuted fracture lateral malleolus    PLAN After closed reduction and reapplication of splint patient will be treated surgically after we get medical clearance from her physician  Procedure closed reduction right ankle fracture  Traction countertraction and reversal deformity was performed on the right ankle in a sugar tong splint was applied.    Fuller Canada, MD 05/27/2016 8:20 AM

## 2016-05-31 ENCOUNTER — Other Ambulatory Visit: Payer: Self-pay | Admitting: *Deleted

## 2016-05-31 ENCOUNTER — Telehealth: Payer: Self-pay | Admitting: Orthopedic Surgery

## 2016-05-31 NOTE — Telephone Encounter (Signed)
Patient called, followed by call from office of Dr Mikael Spray, [facility]) regarding request for clearance for surgery.  Mariah Ibarra from Dr [facility] office states that they will fax a note, however, Dr Malen Gauze states he cannot determine if patient can have surgery; states that will require evaluation by anesthesiologist.  Their office ph# [phone] / fax# [phone].  Patient aware surgery is pending, as discussed at office visit 05/26/16.

## 2016-05-31 NOTE — Telephone Encounter (Signed)
ROUTING TO DR HARRISON FOR REVIEW 

## 2016-05-31 NOTE — Telephone Encounter (Signed)
Letter received from Dr Mikael Spray, [facility] of [place], indicating that he recommends this decision be made by anesthesiologist. Please advise patient, as she is awaiting response.

## 2016-06-01 NOTE — Patient Instructions (Addendum)
    [facility]  06/01/2016     @[email or web address] @   Your procedure is scheduled on 06/03/2016 .  Report to [facility] at  6:15 A.M.  Call this number if you have problems the morning of surgery:  [phone]   Remember:  Do not eat food or drink liquids after midnight.  Take these medicines the morning of surgery with A SIP OF WATER gabapentin, dilaudid if needed, keppra, lisinopril, zofran, zanaflex   Do not wear jewelry, make-up or nail polish.  Do not wear lotions, powders, or perfumes.  You may wear deoderant.  Do not shave 48 hours prior to surgery.  Men may shave face and neck.  Do not bring valuables to the hospital.  [facility] is not responsible for any belongings or valuables.  Contacts, dentures or bridgework may not be worn into surgery.  Leave your suitcase in the car.  After surgery it may be brought to your room.  For patients admitted to the hospital, discharge time will be determined by your treatment team.  Patients discharged the day of surgery will not be allowed to drive home.    Please read over the following fact sheets that you were given. Surgical Site Infection Prevention and Anesthesia Post-op Instructions     PATIENT INSTRUCTIONS POST-ANESTHESIA  IMMEDIATELY FOLLOWING SURGERY:  Do not drive or operate machinery for the first twenty four hours after surgery.  Do not make any important decisions for twenty four hours after surgery or while taking narcotic pain medications or sedatives.  If you develop intractable nausea and vomiting or a severe headache please notify your doctor immediately.  FOLLOW-UP:  Please make an appointment with your surgeon as instructed. You do not need to follow up with anesthesia unless specifically instructed to do so.  WOUND CARE INSTRUCTIONS (if applicable):  Keep a dry clean dressing on the anesthesia/puncture wound site if there is drainage.  Once the wound has quit draining you may leave it open to air.   Generally you should leave the bandage intact for twenty four hours unless there is drainage.  If the epidural site drains for more than 36-48 hours please call the anesthesia department.  QUESTIONS?:  Please feel free to call your physician or the hospital operator if you have any questions, and they will be happy to assist you.

## 2016-06-02 ENCOUNTER — Other Ambulatory Visit: Payer: Self-pay

## 2016-06-02 ENCOUNTER — Encounter (HOSPITAL_COMMUNITY): Payer: Self-pay

## 2016-06-02 ENCOUNTER — Encounter (HOSPITAL_COMMUNITY)
Admission: RE | Admit: 2016-06-02 | Discharge: 2016-06-02 | Disposition: A | Discharge status: Home / Self Care | Payer: Medicare Other | Source: Ambulatory Visit | Attending: Orthopedic Surgery | Admitting: Orthopedic Surgery

## 2016-06-02 DIAGNOSIS — E7800 Pure hypercholesterolemia, unspecified: Secondary | ICD-10-CM | POA: Diagnosis not present

## 2016-06-02 DIAGNOSIS — S82851A Displaced trimalleolar fracture of right lower leg, initial encounter for closed fracture: Secondary | ICD-10-CM | POA: Diagnosis not present

## 2016-06-02 DIAGNOSIS — E222 Syndrome of inappropriate secretion of antidiuretic hormone: Secondary | ICD-10-CM | POA: Diagnosis not present

## 2016-06-02 DIAGNOSIS — F419 Anxiety disorder, unspecified: Secondary | ICD-10-CM | POA: Diagnosis not present

## 2016-06-02 DIAGNOSIS — F410 Panic disorder [episodic paroxysmal anxiety] without agoraphobia: Secondary | ICD-10-CM | POA: Diagnosis not present

## 2016-06-02 DIAGNOSIS — Z818 Family history of other mental and behavioral disorders: Secondary | ICD-10-CM | POA: Diagnosis not present

## 2016-06-02 DIAGNOSIS — Z8673 Personal history of transient ischemic attack (TIA), and cerebral infarction without residual deficits: Secondary | ICD-10-CM | POA: Diagnosis not present

## 2016-06-02 DIAGNOSIS — Z79899 Other long term (current) drug therapy: Secondary | ICD-10-CM | POA: Diagnosis not present

## 2016-06-02 DIAGNOSIS — Y9389 Activity, other specified: Secondary | ICD-10-CM | POA: Diagnosis not present

## 2016-06-02 DIAGNOSIS — G500 Trigeminal neuralgia: Secondary | ICD-10-CM | POA: Diagnosis not present

## 2016-06-02 DIAGNOSIS — I10 Essential (primary) hypertension: Secondary | ICD-10-CM | POA: Diagnosis not present

## 2016-06-02 DIAGNOSIS — W1830XA Fall on same level, unspecified, initial encounter: Secondary | ICD-10-CM | POA: Diagnosis not present

## 2016-06-02 DIAGNOSIS — G40909 Epilepsy, unspecified, not intractable, without status epilepticus: Secondary | ICD-10-CM | POA: Diagnosis not present

## 2016-06-02 DIAGNOSIS — F4310 Post-traumatic stress disorder, unspecified: Secondary | ICD-10-CM | POA: Diagnosis not present

## 2016-06-02 DIAGNOSIS — H9209 Otalgia, unspecified ear: Secondary | ICD-10-CM | POA: Diagnosis not present

## 2016-06-02 LAB — CBC
HCT: 35.5 % — ABNORMAL LOW (ref 36.0–46.0)
Hemoglobin: 11.3 g/dL — ABNORMAL LOW (ref 12.0–15.0)
MCH: 31 pg (ref 26.0–34.0)
MCHC: 31.8 g/dL (ref 30.0–36.0)
MCV: 97.3 fL (ref 78.0–100.0)
PLATELETS: 379 10*3/uL (ref 150–400)
RBC: 3.65 MIL/uL — ABNORMAL LOW (ref 3.87–5.11)
RDW: 14.8 % (ref 11.5–15.5)
WBC: 8.2 10*3/uL (ref 4.0–10.5)

## 2016-06-02 LAB — BASIC METABOLIC PANEL
ANION GAP: 11 (ref 5–15)
BUN: 19 mg/dL (ref 6–20)
CALCIUM: 8.4 mg/dL — AB (ref 8.9–10.3)
CO2: 28 mmol/L (ref 22–32)
CREATININE: 1.39 mg/dL — AB (ref 0.44–1.00)
Chloride: 96 mmol/L — ABNORMAL LOW (ref 101–111)
GFR, EST AFRICAN AMERICAN: 52 mL/min — AB (ref >60)
GFR, EST NON AFRICAN AMERICAN: 45 mL/min — AB (ref >60)
GLUCOSE: 126 mg/dL — AB (ref 65–99)
Potassium: 3.8 mmol/L (ref 3.5–5.1)
Sodium: 135 mmol/L (ref 135–145)

## 2016-06-02 NOTE — Pre-Procedure Instructions (Signed)
Dr Jayme Cloud aware of extensive seizure and trigeminal neuralgia history. No further orders given Patent and husband given verbal instructions for patient to take Keppra tonight and in am as ordered. Patient states she has not missed a keppra dose in many years. Given instructions to sign up for my chart at home.

## 2016-06-03 ENCOUNTER — Ambulatory Visit (HOSPITAL_COMMUNITY): Payer: Medicare Other

## 2016-06-03 ENCOUNTER — Ambulatory Visit (HOSPITAL_COMMUNITY): Payer: Medicare Other | Admitting: Anesthesiology

## 2016-06-03 ENCOUNTER — Encounter (HOSPITAL_COMMUNITY)
Admission: RE | Disposition: A | Discharge status: Home / Self Care | Payer: Self-pay | Source: Ambulatory Visit | Attending: Orthopedic Surgery

## 2016-06-03 ENCOUNTER — Ambulatory Visit (HOSPITAL_COMMUNITY)
Admission: RE | Admit: 2016-06-03 | Discharge: 2016-06-03 | Disposition: A | Discharge status: Home / Self Care | Payer: Medicare Other | Source: Ambulatory Visit | Attending: Orthopedic Surgery | Admitting: Orthopedic Surgery

## 2016-06-03 ENCOUNTER — Encounter (HOSPITAL_COMMUNITY): Payer: Self-pay

## 2016-06-03 ENCOUNTER — Ambulatory Visit: Payer: Self-pay | Admitting: Orthopedic Surgery

## 2016-06-03 DIAGNOSIS — G500 Trigeminal neuralgia: Secondary | ICD-10-CM | POA: Diagnosis not present

## 2016-06-03 DIAGNOSIS — Z8673 Personal history of transient ischemic attack (TIA), and cerebral infarction without residual deficits: Secondary | ICD-10-CM | POA: Diagnosis not present

## 2016-06-03 DIAGNOSIS — H9209 Otalgia, unspecified ear: Secondary | ICD-10-CM | POA: Insufficient documentation

## 2016-06-03 DIAGNOSIS — F419 Anxiety disorder, unspecified: Secondary | ICD-10-CM | POA: Insufficient documentation

## 2016-06-03 DIAGNOSIS — Z818 Family history of other mental and behavioral disorders: Secondary | ICD-10-CM | POA: Insufficient documentation

## 2016-06-03 DIAGNOSIS — Z79899 Other long term (current) drug therapy: Secondary | ICD-10-CM | POA: Diagnosis not present

## 2016-06-03 DIAGNOSIS — G40909 Epilepsy, unspecified, not intractable, without status epilepticus: Secondary | ICD-10-CM | POA: Diagnosis not present

## 2016-06-03 DIAGNOSIS — I10 Essential (primary) hypertension: Secondary | ICD-10-CM | POA: Insufficient documentation

## 2016-06-03 DIAGNOSIS — E222 Syndrome of inappropriate secretion of antidiuretic hormone: Secondary | ICD-10-CM | POA: Insufficient documentation

## 2016-06-03 DIAGNOSIS — S82851A Displaced trimalleolar fracture of right lower leg, initial encounter for closed fracture: Secondary | ICD-10-CM | POA: Diagnosis not present

## 2016-06-03 DIAGNOSIS — E7800 Pure hypercholesterolemia, unspecified: Secondary | ICD-10-CM | POA: Diagnosis not present

## 2016-06-03 DIAGNOSIS — F410 Panic disorder [episodic paroxysmal anxiety] without agoraphobia: Secondary | ICD-10-CM | POA: Insufficient documentation

## 2016-06-03 DIAGNOSIS — W1830XA Fall on same level, unspecified, initial encounter: Secondary | ICD-10-CM | POA: Insufficient documentation

## 2016-06-03 DIAGNOSIS — F4310 Post-traumatic stress disorder, unspecified: Secondary | ICD-10-CM | POA: Insufficient documentation

## 2016-06-03 DIAGNOSIS — S82891A Other fracture of right lower leg, initial encounter for closed fracture: Secondary | ICD-10-CM

## 2016-06-03 DIAGNOSIS — S8251XA Displaced fracture of medial malleolus of right tibia, initial encounter for closed fracture: Secondary | ICD-10-CM | POA: Diagnosis not present

## 2016-06-03 DIAGNOSIS — Y9389 Activity, other specified: Secondary | ICD-10-CM | POA: Insufficient documentation

## 2016-06-03 HISTORY — PX: ORIF ANKLE FRACTURE: SHX5408

## 2016-06-03 SURGERY — OPEN REDUCTION INTERNAL FIXATION (ORIF) ANKLE FRACTURE
Anesthesia: General | Laterality: Right

## 2016-06-03 MED ORDER — GLYCOPYRROLATE 0.2 MG/ML IJ SOLN
INTRAMUSCULAR | Status: DC | PRN
  Administered 2016-06-03: 0.6 mg via INTRAVENOUS

## 2016-06-03 MED ORDER — SEVOFLURANE IN SOLN
RESPIRATORY_TRACT | Status: AC
  Filled 2016-06-03: qty 250

## 2016-06-03 MED ORDER — PROMETHAZINE HCL 25 MG/ML IJ SOLN
12.5000 mg | Freq: Once | INTRAMUSCULAR | Status: AC
  Administered 2016-06-03: 12.5 mg via INTRAVENOUS

## 2016-06-03 MED ORDER — ONDANSETRON HCL 4 MG/2ML IJ SOLN
INTRAMUSCULAR | Status: AC
  Filled 2016-06-03: qty 2

## 2016-06-03 MED ORDER — PROPOFOL 10 MG/ML IV BOLUS
INTRAVENOUS | Status: AC
  Filled 2016-06-03: qty 40

## 2016-06-03 MED ORDER — BUPIVACAINE-EPINEPHRINE (PF) 0.5% -1:200000 IJ SOLN
INTRAMUSCULAR | Status: DC | PRN
  Administered 2016-06-03: 60 mL

## 2016-06-03 MED ORDER — 0.9 % SODIUM CHLORIDE (POUR BTL) OPTIME
TOPICAL | Status: DC | PRN
  Administered 2016-06-03 (×2): 1000 mL

## 2016-06-03 MED ORDER — HYDROMORPHONE HCL 1 MG/ML IJ SOLN
INTRAMUSCULAR | Status: AC
  Filled 2016-06-03: qty 1

## 2016-06-03 MED ORDER — HEMOSTATIC AGENTS (NO CHARGE) OPTIME
TOPICAL | Status: DC | PRN
  Administered 2016-06-03 (×2): 1 via TOPICAL

## 2016-06-03 MED ORDER — FENTANYL CITRATE (PF) 100 MCG/2ML IJ SOLN
INTRAMUSCULAR | Status: AC
  Filled 2016-06-03: qty 2

## 2016-06-03 MED ORDER — CHLORHEXIDINE GLUCONATE 4 % EX LIQD: 60.0000 mL | Freq: Once | CUTANEOUS | Status: DC

## 2016-06-03 MED ORDER — SODIUM CHLORIDE 0.9% FLUSH
INTRAVENOUS | Status: AC
  Filled 2016-06-03: qty 10

## 2016-06-03 MED ORDER — SUCCINYLCHOLINE CHLORIDE 20 MG/ML IJ SOLN
INTRAMUSCULAR | Status: AC
  Filled 2016-06-03: qty 1

## 2016-06-03 MED ORDER — PROPOFOL 10 MG/ML IV BOLUS
INTRAVENOUS | Status: DC | PRN
Start: 2016-06-03 — End: 2016-06-03
  Administered 2016-06-03 (×2): 50 mg via INTRAVENOUS
  Administered 2016-06-03: 150 mg via INTRAVENOUS
  Administered 2016-06-03: 50 mg via INTRAVENOUS

## 2016-06-03 MED ORDER — FENTANYL CITRATE (PF) 100 MCG/2ML IJ SOLN
INTRAMUSCULAR | Status: DC | PRN
  Administered 2016-06-03: 50 ug via INTRAVENOUS
  Administered 2016-06-03: 25 ug via INTRAVENOUS
  Administered 2016-06-03 (×6): 50 ug via INTRAVENOUS
  Administered 2016-06-03 (×2): 25 ug via INTRAVENOUS
  Administered 2016-06-03: 50 ug via INTRAVENOUS
  Administered 2016-06-03: 25 ug via INTRAVENOUS
  Administered 2016-06-03 (×5): 50 ug via INTRAVENOUS

## 2016-06-03 MED ORDER — MIDAZOLAM HCL 2 MG/2ML IJ SOLN
INTRAMUSCULAR | Status: AC
  Filled 2016-06-03: qty 2

## 2016-06-03 MED ORDER — FENTANYL CITRATE (PF) 100 MCG/2ML IJ SOLN
25.0000 ug | INTRAMUSCULAR | Status: DC | PRN
  Administered 2016-06-03: 50 ug via INTRAVENOUS
  Administered 2016-06-03: 25 ug via INTRAVENOUS
  Administered 2016-06-03 (×2): 50 ug via INTRAVENOUS
  Administered 2016-06-03: 25 ug via INTRAVENOUS
  Filled 2016-06-03 (×2): qty 2

## 2016-06-03 MED ORDER — DEXAMETHASONE SODIUM PHOSPHATE 4 MG/ML IJ SOLN
INTRAMUSCULAR | Status: AC
  Filled 2016-06-03: qty 1

## 2016-06-03 MED ORDER — BUPIVACAINE-EPINEPHRINE (PF) 0.5% -1:200000 IJ SOLN
INTRAMUSCULAR | Status: AC
  Filled 2016-06-03: qty 60

## 2016-06-03 MED ORDER — GLYCOPYRROLATE 0.2 MG/ML IJ SOLN
INTRAMUSCULAR | Status: AC
  Filled 2016-06-03: qty 3

## 2016-06-03 MED ORDER — NEOSTIGMINE METHYLSULFATE 10 MG/10ML IV SOLN
INTRAVENOUS | Status: AC
  Filled 2016-06-03: qty 1

## 2016-06-03 MED ORDER — HYDROMORPHONE HCL 4 MG PO TABS: 4.0000 mg | ORAL_TABLET | ORAL | 0 refills | Status: AC | PRN

## 2016-06-03 MED ORDER — MIDAZOLAM HCL 2 MG/2ML IJ SOLN
1.0000 mg | INTRAMUSCULAR | Status: DC | PRN
  Administered 2016-06-03: 2 mg via INTRAVENOUS

## 2016-06-03 MED ORDER — NEOSTIGMINE METHYLSULFATE 10 MG/10ML IV SOLN
INTRAVENOUS | Status: DC | PRN
  Administered 2016-06-03: 4 mg via INTRAVENOUS

## 2016-06-03 MED ORDER — PROMETHAZINE HCL 25 MG/ML IJ SOLN
INTRAMUSCULAR | Status: AC
  Filled 2016-06-03: qty 1

## 2016-06-03 MED ORDER — LIDOCAINE HCL (PF) 1 % IJ SOLN
INTRAMUSCULAR | Status: AC
  Filled 2016-06-03: qty 5

## 2016-06-03 MED ORDER — CEFAZOLIN SODIUM-DEXTROSE 2-4 GM/100ML-% IV SOLN
2.0000 g | INTRAVENOUS | Status: AC
  Administered 2016-06-03: 2 g via INTRAVENOUS
  Filled 2016-06-03: qty 100

## 2016-06-03 MED ORDER — HYDROMORPHONE HCL 1 MG/ML IJ SOLN
1.0000 mg | INTRAMUSCULAR | Status: AC
  Administered 2016-06-03 (×4): 1 mg via INTRAVENOUS
  Filled 2016-06-03 (×3): qty 1

## 2016-06-03 MED ORDER — DEXAMETHASONE SODIUM PHOSPHATE 4 MG/ML IJ SOLN
INTRAMUSCULAR | Status: DC | PRN
Start: 2016-06-03 — End: 2016-06-03
  Administered 2016-06-03: 4 mg via INTRAVENOUS

## 2016-06-03 MED ORDER — LIDOCAINE HCL (CARDIAC) 20 MG/ML IV SOLN
INTRAVENOUS | Status: DC | PRN
  Administered 2016-06-03: 25 mg via INTRAVENOUS
  Administered 2016-06-03: 50 mg via INTRAVENOUS

## 2016-06-03 MED ORDER — FENTANYL CITRATE (PF) 100 MCG/2ML IJ SOLN
25.0000 ug | INTRAMUSCULAR | Status: AC
  Administered 2016-06-03 (×2): 25 ug via INTRAVENOUS

## 2016-06-03 MED ORDER — DEXAMETHASONE SODIUM PHOSPHATE 4 MG/ML IJ SOLN
4.0000 mg | Freq: Once | INTRAMUSCULAR | Status: AC
  Administered 2016-06-03: 4 mg via INTRAVENOUS

## 2016-06-03 MED ORDER — FENTANYL CITRATE (PF) 250 MCG/5ML IJ SOLN
INTRAMUSCULAR | Status: AC
  Filled 2016-06-03: qty 5

## 2016-06-03 MED ORDER — SUCCINYLCHOLINE CHLORIDE 20 MG/ML IJ SOLN
INTRAMUSCULAR | Status: DC | PRN
Start: 2016-06-03 — End: 2016-06-03
  Administered 2016-06-03: 100 mg via INTRAVENOUS

## 2016-06-03 MED ORDER — ROCURONIUM BROMIDE 100 MG/10ML IV SOLN
INTRAVENOUS | Status: DC | PRN
Start: 2016-06-03 — End: 2016-06-03
  Administered 2016-06-03: 15 mg via INTRAVENOUS
  Administered 2016-06-03: 10 mg via INTRAVENOUS

## 2016-06-03 MED ORDER — LACTATED RINGERS IV SOLN
INTRAVENOUS | Status: DC
  Administered 2016-06-03: 1000 mL via INTRAVENOUS
  Administered 2016-06-03: 09:00:00 via INTRAVENOUS

## 2016-06-03 MED ORDER — ONDANSETRON HCL 4 MG/2ML IJ SOLN
4.0000 mg | Freq: Once | INTRAMUSCULAR | Status: AC
  Administered 2016-06-03: 4 mg via INTRAVENOUS

## 2016-06-03 MED ORDER — ONDANSETRON HCL 4 MG/2ML IJ SOLN
4.0000 mg | Freq: Once | INTRAMUSCULAR | Status: AC | PRN
  Administered 2016-06-03: 4 mg via INTRAVENOUS
  Filled 2016-06-03: qty 2

## 2016-06-03 SURGICAL SUPPLY — 56 items
BAG HAMPER (MISCELLANEOUS) ×3 IMPLANT
BANDAGE ELASTIC 4 LF NS (GAUZE/BANDAGES/DRESSINGS) ×3 IMPLANT
BANDAGE ESMARK 4X12 BL STRL LF (DISPOSABLE) ×1 IMPLANT
BLADE SURG SZ10 CARB STEEL (BLADE) ×3 IMPLANT
BNDG COHESIVE 4X5 TAN STRL (GAUZE/BANDAGES/DRESSINGS) ×3 IMPLANT
BNDG ESMARK 4X12 BLUE STRL LF (DISPOSABLE) ×3
BRACE ANKLE STIRRUP 9 MED R (SOFTGOODS) ×3 IMPLANT
CHLORAPREP W/TINT 26ML (MISCELLANEOUS) ×6 IMPLANT
CLOTH BEACON ORANGE TIMEOUT ST (SAFETY) ×3 IMPLANT
COVER LIGHT HANDLE STERIS (MISCELLANEOUS) ×6 IMPLANT
COVER MAYO STAND XLG (DRAPE) ×3 IMPLANT
CUFF TOURNIQUET SINGLE 34IN LL (TOURNIQUET CUFF) ×3 IMPLANT
DRAPE C-ARM FOLDED MOBILE STRL (DRAPES) ×3 IMPLANT
DRAPE PROXIMA HALF (DRAPES) ×3 IMPLANT
DRILL 2.6X122MM WL AO SHAFT (BIT) ×6 IMPLANT
GAUZE SPONGE 4X4 12PLY STRL (GAUZE/BANDAGES/DRESSINGS) ×6 IMPLANT
GAUZE XEROFORM 5X9 LF (GAUZE/BANDAGES/DRESSINGS) ×3 IMPLANT
GLOVE BIO SURGEON STRL SZ7 (GLOVE) ×6 IMPLANT
GLOVE BIOGEL PI IND STRL 7.0 (GLOVE) ×3 IMPLANT
GLOVE BIOGEL PI IND STRL 8 (GLOVE) ×1 IMPLANT
GLOVE BIOGEL PI INDICATOR 7.0 (GLOVE) ×6
GLOVE BIOGEL PI INDICATOR 8 (GLOVE) ×2
GLOVE SKINSENSE NS SZ8.0 LF (GLOVE) ×2
GLOVE SKINSENSE STRL SZ8.0 LF (GLOVE) ×1 IMPLANT
GLOVE SS N UNI LF 8.5 STRL (GLOVE) ×3 IMPLANT
GOWN STRL REUS W/TWL LRG LVL3 (GOWN DISPOSABLE) ×6 IMPLANT
GOWN STRL REUS W/TWL XL LVL3 (GOWN DISPOSABLE) ×3 IMPLANT
HEMOSTAT SURGICEL 4X8 (HEMOSTASIS) ×6 IMPLANT
INST SET MINOR BONE (KITS) ×3 IMPLANT
KIT ROOM TURNOVER APOR (KITS) ×3 IMPLANT
MANIFOLD NEPTUNE II (INSTRUMENTS) ×3 IMPLANT
NEEDLE HYPO 21X1.5 SAFETY (NEEDLE) ×3 IMPLANT
NS IRRIG 1000ML POUR BTL (IV SOLUTION) ×6 IMPLANT
PACK BASIC LIMB (CUSTOM PROCEDURE TRAY) ×3 IMPLANT
PAD ABD 5X9 TENDERSORB (GAUZE/BANDAGES/DRESSINGS) ×6 IMPLANT
PAD ARMBOARD 7.5X6 YLW CONV (MISCELLANEOUS) ×3 IMPLANT
PAD CAST 4YDX4 CTTN HI CHSV (CAST SUPPLIES) ×1 IMPLANT
PADDING CAST COTTON 4X4 STRL (CAST SUPPLIES) ×2
PADDING WEBRIL 4 STERILE (GAUZE/BANDAGES/DRESSINGS) ×6 IMPLANT
PLATE 7H 96MM (Plate) ×3 IMPLANT
SCREW 46X4.0MM (Screw) ×6 IMPLANT
SCREW BONE 14MMX3.5MM (Screw) ×3 IMPLANT
SCREW BONE 18 (Screw) ×3 IMPLANT
SCREW BONE 3.5X16MM (Screw) ×3 IMPLANT
SCREW BONE 3.5X20MM (Screw) ×3 IMPLANT
SCREW BONE NON-LCKING 3.5X12MM (Screw) ×6 IMPLANT
SCREW NONLOCK 22MM (Screw) ×3 IMPLANT
SET BASIN LINEN APH (SET/KITS/TRAYS/PACK) ×3 IMPLANT
SPONGE LAP 18X18 X RAY DECT (DISPOSABLE) ×9 IMPLANT
STAPLER VISISTAT 35W (STAPLE) ×3 IMPLANT
SUT ETHILON 3 0 FSL (SUTURE) IMPLANT
SUT MON AB 0 CT1 (SUTURE) ×6 IMPLANT
SUT MON AB 2-0 CT1 36 (SUTURE) ×6 IMPLANT
SYR 30ML LL (SYRINGE) ×3 IMPLANT
SYR BULB IRRIGATION 50ML (SYRINGE) ×3 IMPLANT
YANKAUER SUCT BULB TIP NO VENT (SUCTIONS) ×3 IMPLANT

## 2016-06-03 NOTE — Addendum Note (Signed)
Addendum  created 06/03/16 1219 by Earleen Newport, CRNA   Charge Capture section accepted

## 2016-06-03 NOTE — Interval H&P Note (Signed)
History and Physical Interval Note:  06/03/2016 7:25 AM  Mariah Ibarra  has presented today for surgery, with the diagnosis of right ankle fracture  The various methods of treatment have been discussed with the patient and family. After consideration of risks, benefits and other options for treatment, the patient has consented to  Procedure(s): OPEN REDUCTION INTERNAL FIXATION (ORIF) ANKLE FRACTURE (Right) as a surgical intervention .  The patient's history has been reviewed, patient examined, no change in status, stable for surgery.  I have reviewed the patient's chart and labs.  Questions were answered to the patient's satisfaction.    She says she hurt the knee area yesterday ; we will flouro the area in surgery    Fuller Canada

## 2016-06-03 NOTE — OR Nursing (Signed)
Patient on bedpan to void at 1245. Patient missed bedpan and up to recliner so she could be cleaned. She states that her pain is a 6 and she "can handle this, I want to go home". Told her that Dr Romeo Apple was going to admit her for pain control and patient gets loud, hyperventilating and shouts "I will sign myself out. I want to go home, I cannot handle this". Dr Starling Manns notified and states to let patient go home. Report called to Cynda Acres RN and Tammy Vaught RN and patient to post op via recliner.

## 2016-06-03 NOTE — Anesthesia Postprocedure Evaluation (Signed)
Anesthesia Post Note (Late Entry)  Patient: Mariah Ibarra  Procedure(s) Performed: Procedure(s) (LRB): OPEN REDUCTION INTERNAL FIXATION (ORIF) RIGHT ANKLE (Right)  Patient location during evaluation: PACU Anesthesia Type: General Level of consciousness: awake and alert and oriented Pain management: pain level not controlled (Patient is requiring large dose of narcotics; Dr. Marcos Eke aware switched from fentantyl to Dilaudid; patient will sleep for short periods of time then awaken a pain score of "9" ) Vital Signs Assessment: post-procedure vital signs reviewed and stable Respiratory status: spontaneous breathing Cardiovascular status: stable Postop Assessment: no signs of nausea or vomiting Anesthetic complications: no    Last Vitals:  Vitals:   06/03/16 1145 06/03/16 1200  BP: (!) 144/92 (!) 129/93  Pulse: 86 88  Resp: 18 (!) 9  Temp:      Last Pain:  Vitals:   06/03/16 1145  TempSrc:   PainSc: 9                  ADAMS, AMY A

## 2016-06-03 NOTE — Discharge Instructions (Signed)
Incision Care °An incision is when a surgeon cuts into your body. After surgery, the incision needs to be cared for properly to prevent infection.  °HOW TO CARE FOR YOUR INCISION °· Take medicines only as directed by your health care provider. °· There are many different ways to close and cover an incision, including stitches, skin glue, and adhesive strips. Follow your health care provider's instructions on: °¨ Incision care. °¨ Bandage (dressing) changes and removal. °¨ Incision closure removal. °· Do not take baths, swim, or use a hot tub until your health care provider approves. You may shower as directed by your health care provider. °· Resume your normal diet and activities as directed. °· Use anti-itch medicine (such as an antihistamine) as directed by your health care provider. The incision may itch while it is healing. Do not pick or scratch at the incision. °· Drink enough fluid to keep your urine clear or pale yellow. °SEEK MEDICAL CARE IF:  °· You have drainage, redness, swelling, or pain at your incision site. °· You have muscle aches, chills, or a general ill feeling. °· You notice a bad smell coming from the incision or dressing. °· Your incision edges separate after the sutures, staples, or skin adhesive strips have been removed. °· You have persistent nausea or vomiting. °· You have a fever. °· You are dizzy. °SEEK IMMEDIATE MEDICAL CARE IF:  °· You have a rash. °· You faint. °· You have difficulty breathing. °MAKE SURE YOU:  °· Understand these instructions. °· Will watch your condition. °· Will get help right away if you are not doing well or get worse. °  °This information is not intended to replace advice given to you by your health care provider. Make sure you discuss any questions you have with your health care provider. °  °Document Released: 05/07/2005 Document Revised: 11/08/2014 Document Reviewed: 12/12/2013 °Elsevier Interactive Patient Education ©2016 [employer] ° °PATIENT  INSTRUCTIONS °POST-ANESTHESIA ° °IMMEDIATELY FOLLOWING SURGERY:  Do not drive or operate machinery for the first twenty four hours after surgery.  Do not make any important decisions for twenty four hours after surgery or while taking narcotic pain medications or sedatives.  If you develop intractable nausea and vomiting or a severe headache please notify your doctor immediately. ° °FOLLOW-UP:  Please make an appointment with your surgeon as instructed. You do not need to follow up with anesthesia unless specifically instructed to do so. ° °WOUND CARE INSTRUCTIONS (if applicable):  Keep a dry clean dressing on the anesthesia/puncture wound site if there is drainage.  Once the wound has quit draining you may leave it open to air.  Generally you should leave the bandage intact for twenty four hours unless there is drainage.  If the epidural site drains for more than 36-48 hours please call the anesthesia department. ° °QUESTIONS?:  Please feel free to call your physician or the hospital operator if you have any questions, and they will be happy to assist you.    ° ° ° °

## 2016-06-03 NOTE — Brief Op Note (Signed)
06/03/2016  10:01 AM  PATIENT:  Mariah Ibarra  45 y.o. female  PRE-OPERATIVE DIAGNOSIS:  right ankle fracture, trimalleolar  POST-OPERATIVE DIAGNOSIS:  right ankle fracture, trimalleolar  PROCEDURE:  Procedure(s): OPEN REDUCTION INTERNAL FIXATION (ORIF) RIGHT ANKLE (Right)  Findings comminuted fibular fracture subluxation of the talus transverse medial malleolar fracture and avulsion fracture posterior malleolus  Implants Stryker ankle solutions set lateral plate 7 screws medial 2 partially-threaded cancellus screws  Aircast applied at the end of the case  Details of procedure site marking was performed chart was reviewed appropriate paperwork was filled out patient was taken to the operating room for general anesthesia with light a scope due to difficult intubation. Ancef was given IV based on her weight of 83 kg  The supine position we scanned her femur due to injury which occurred prior to today's admission but after being seen in the office. The femur had no fracture. I also examined the knee and there was no ligament laxity  She was then prepped and draped sterilely tourniquet applied to the right thigh  We did the timeout and that was satisfactory  We elevated the tourniquet after exsanguinating the limb six-inch Esmarch. Tourniquet pressure 3 mmHg  A lateral incision was made right down to bone. Fracture was encountered. It was irrigated debrided and manual reduction was performed. X-ray was brought in while the fracture was held with bone clamps and the mortise was reduced ankle fracture lateral malleolus was reduced. A piece of bone from the posterior portion was removed as it was devitalized.  We bent the plate and contoured to the fibula applied it and AO technique using cortical screws without locking.  X-ray was repeated and transverse fracture medial malleolus was also reduced at that time on x-ray.  We then made a longitudinal incision down to bone on the medial  malleolus and tibia expose the fracture debrided irrigated we got a good look at the anterior corner of the fracture at the articular margin and it was reduced anatomically.  A 2.5 drill was placed in the tibia and a bone reduction clamp was used to hold the fracture reduced and x-ray was brought in to confirm reduction  We placed 2 parallel screws in the medial malleolus we removed one when we saw on x-ray did appear to be in the joint we repositioned it and floor of the ankle and it was in good position  Lateral x-ray confirmed reduction and final x-rays confirmed ankle mortise reduction  The wounds were irrigated.  At that time we noticed venous tourniquet. We did place some Surgicel medially used 2-0 Monocryl and staples to close the medial side and then used 0 Monocryl and staples to close the lateral side. We did a peroneal nerve block with Marcaine and epinephrine we did a saphenous nerve block. We injected Marcaine with epinephrine into both wound edges. Total of 60 mL.  We dressed the wounds and placed her in an Aircast to allow ankle motion.  She was extubated taken recovery room in stable condition  I was assisted by Loraine Nation  Gen. anesthesia   SURGEON:  Surgeon(s) and Role:    * Vickki Hearing, MD - Primary  EBL:  Total I/O In: 1500 [I.V.:1500] Out: 100 [Blood:100]  BLOOD ADMINISTERED:none  DRAINS: none   LOCAL MEDICATIONS USED:  MARCAINE   , Amount: 60 ml and OTHER epi  SPECIMEN:  No Specimen  DISPOSITION OF SPECIMEN:  N/A  COUNTS:  YES  TOURNIQUET:  Total Tourniquet Time Documented: Thigh (Right) - 75 minutes Total: Thigh (Right) - 75 minutes   DICTATION: .Dragon Dictation  PLAN OF CARE: Discharge to home after PACU  PATIENT DISPOSITION:  PACU - hemodynamically stable.   Delay start of Pharmacological VTE agent (>24hrs) due to surgical blood loss or risk of bleeding: not applicable  

## 2016-06-03 NOTE — Anesthesia Procedure Notes (Signed)
Procedure Name: LMA Insertion Date/Time: 06/03/2016 7:41 AM Performed by: Pernell Dupre, Denesha Brouse A Pre-anesthesia Checklist: Patient identified, Emergency Drugs available, Timeout performed, Suction available and Patient being monitored Patient Re-evaluated:Patient Re-evaluated prior to inductionOxygen Delivery Method: Circle system utilized Preoxygenation: Pre-oxygenation with 100% oxygen Intubation Type: IV induction Ventilation: Mask ventilation without difficulty LMA: LMA inserted LMA Size: 4.0 Number of attempts: 1 Placement Confirmation: positive ETCO2 Tube secured with: Tape Dental Injury: Teeth and Oropharynx as per pre-operative assessment

## 2016-06-03 NOTE — Op Note (Signed)
06/03/2016  10:01 AM  PATIENT:  Mariah Ibarra  45 y.o. female  PRE-OPERATIVE DIAGNOSIS:  right ankle fracture, trimalleolar  POST-OPERATIVE DIAGNOSIS:  right ankle fracture, trimalleolar  PROCEDURE:  Procedure(s): OPEN REDUCTION INTERNAL FIXATION (ORIF) RIGHT ANKLE (Right)  Findings comminuted fibular fracture subluxation of the talus transverse medial malleolar fracture and avulsion fracture posterior malleolus  Implants Stryker ankle solutions set lateral plate 7 screws medial 2 partially-threaded cancellus screws  Aircast applied at the end of the case  Details of procedure site marking was performed chart was reviewed appropriate paperwork was filled out patient was taken to the operating room for general anesthesia with light a scope due to difficult intubation. Ancef was given IV based on her weight of 83 kg  The supine position we scanned her femur due to injury which occurred prior to today's admission but after being seen in the office. The femur had no fracture. I also examined the knee and there was no ligament laxity  She was then prepped and draped sterilely tourniquet applied to the right thigh  We did the timeout and that was satisfactory  We elevated the tourniquet after exsanguinating the limb six-inch Esmarch. Tourniquet pressure 3 mmHg  A lateral incision was made right down to bone. Fracture was encountered. It was irrigated debrided and manual reduction was performed. X-ray was brought in while the fracture was held with bone clamps and the mortise was reduced ankle fracture lateral malleolus was reduced. A piece of bone from the posterior portion was removed as it was devitalized.  We bent the plate and contoured to the fibula applied it and AO technique using cortical screws without locking.  X-ray was repeated and transverse fracture medial malleolus was also reduced at that time on x-ray.  We then made a longitudinal incision down to bone on the medial  malleolus and tibia expose the fracture debrided irrigated we got a good look at the anterior corner of the fracture at the articular margin and it was reduced anatomically.  A 2.5 drill was placed in the tibia and a bone reduction clamp was used to hold the fracture reduced and x-ray was brought in to confirm reduction  We placed 2 parallel screws in the medial malleolus we removed one when we saw on x-ray did appear to be in the joint we repositioned it and floor of the ankle and it was in good position  Lateral x-ray confirmed reduction and final x-rays confirmed ankle mortise reduction  The wounds were irrigated.  At that time we noticed venous tourniquet. We did place some Surgicel medially used 2-0 Monocryl and staples to close the medial side and then used 0 Monocryl and staples to close the lateral side. We did a peroneal nerve block with Marcaine and epinephrine we did a saphenous nerve block. We injected Marcaine with epinephrine into both wound edges. Total of 60 mL.  We dressed the wounds and placed her in an Aircast to allow ankle motion.  She was extubated taken recovery room in stable condition  I was assisted by Loraine Nation  Gen. anesthesia   SURGEON:  Surgeon(s) and Role:    * Vickki Hearing, MD - Primary  EBL:  Total I/O In: 1500 [I.V.:1500] Out: 100 [Blood:100]  BLOOD ADMINISTERED:none  DRAINS: none   LOCAL MEDICATIONS USED:  MARCAINE   , Amount: 60 ml and OTHER epi  SPECIMEN:  No Specimen  DISPOSITION OF SPECIMEN:  N/A  COUNTS:  YES  TOURNIQUET:  Total Tourniquet Time Documented: Thigh (Right) - 75 minutes Total: Thigh (Right) - 75 minutes   DICTATION: .Reubin Milan Dictation  PLAN OF CARE: Discharge to home after PACU  PATIENT DISPOSITION:  PACU - hemodynamically stable.   Delay start of Pharmacological VTE agent (>24hrs) due to surgical blood loss or risk of bleeding: not applicable

## 2016-06-03 NOTE — Anesthesia Preprocedure Evaluation (Signed)
Anesthesia Evaluation  Patient identified by MRN, date of birth, ID band Patient awake    Reviewed: Allergy & Precautions, NPO status , Patient's Chart, lab work & pertinent test results  History of Anesthesia Complications (+) PONV and history of anesthetic complications  Airway Mallampati: II  TM Distance: >3 FB     Dental  (+) Teeth Intact   Pulmonary    breath sounds clear to auscultation       Cardiovascular hypertension,  Rhythm:Regular Rate:Normal     Neuro/Psych Seizures -,  PSYCHIATRIC DISORDERS Anxiety Depression Trigeminal neuralgia - Gamma Knife and surgical ablation - DREZ procedure. SIADH (syndrome of inappropriate ADH production) CVA    GI/Hepatic negative GI ROS,   Endo/Other    Renal/GU      Musculoskeletal   Abdominal   Peds  Hematology   Anesthesia Other Findings   Reproductive/Obstetrics                             Anesthesia Physical Anesthesia Plan  ASA: III  Anesthesia Plan: General   Post-op Pain Management:    Induction: Intravenous  Airway Management Planned: LMA  Additional Equipment:   Intra-op Plan:   Post-operative Plan: Extubation in OR  Informed Consent: I have reviewed the patients History and Physical, chart, labs and discussed the procedure including the risks, benefits and alternatives for the proposed anesthesia with the patient or authorized representative who has indicated his/her understanding and acceptance.     Plan Discussed with:   Anesthesia Plan Comments:         Anesthesia Quick Evaluation

## 2016-06-03 NOTE — Anesthesia Procedure Notes (Signed)
Procedure Name: Intubation Date/Time: 06/03/2016 7:56 AM Performed by: Pernell Dupre, AMY A Pre-anesthesia Checklist: Patient identified, Timeout performed, Emergency Drugs available, Suction available and Patient being monitored Patient Re-evaluated:Patient Re-evaluated prior to inductionOxygen Delivery Method: Circle system utilized Intubation Type: IV induction Ventilation: Mask ventilation without difficulty Laryngoscope Size: Glidescope and 3 Grade View: Grade I Tube size: 6.0 mm Number of attempts: 3 Placement Confirmation: ETT inserted through vocal cords under direct vision,  positive ETCO2 and breath sounds checked- equal and bilateral Tube secured with: Tape Dental Injury: Teeth and Oropharynx as per pre-operative assessment  Difficulty Due To: Difficult Airway- due to immobile epiglottis Comments: Decision made to convert from LMA to ETT because unable to adequately ventilate through LMA despite reposition; DL x1 by me with Hyacinth Meeker 3, able to visualize cords but unable to pass ETT due to small mouth opening and prominent epiglottis and epiglottic folds; DL by Dr. Marcos Eke with Mac 3; Final DL with Glidescope 3 with by Dr. Marcos Eke, 6.0 ETT passed;  VSS throughout; easy mask ventilation between each attempt

## 2016-06-03 NOTE — Transfer of Care (Signed)
Immediate Anesthesia Transfer of Care Note  Patient: Mariah Ibarra [facility]  Procedure(s) Performed: Procedure(s): OPEN REDUCTION INTERNAL FIXATION (ORIF) RIGHT ANKLE (Right)  Patient Location: PACU  Anesthesia Type:General  Level of Consciousness: awake, oriented and patient cooperative  Airway & Oxygen Therapy: Patient connected to face mask oxygen  Post-op Assessment: Report given to RN and Post -op Vital signs reviewed and stable  Post vital signs: Reviewed and stable  Last Vitals:  Vitals:   06/03/16 1009 06/03/16 1015  BP: (!) 159/98 (!) 159/98  Pulse: (!) 101 (!) 101  Resp: 14 17  Temp: 36.8 C     Last Pain:  Vitals:   06/03/16 0636  TempSrc: Oral         Complications: No apparent anesthesia complications

## 2016-06-03 NOTE — H&P (Signed)
Chief Complaint  Patient presents with  . New Patient (Initial Visit)      Fractured Right TIB FIB DOI 05/24/16    HPI 45 year old female with history of seizure disorder, posttraumatic stress syndrome and trigeminal neuralgia thought she was having a seizure, 2 days ago,she  tried to go down to the ground slipped and fell injured her right ankle and sustained a fracture of the right ankle and presents for evaluation and treatment   Complains of severe pain swelling on the medial and lateral aspects of the ankle. She said her foot was initially severely malaligned and her husband realigned it she went to the hospital for x-rays. Those x-rays are inconclusive regarding the nature of this injury she was sent back to the hospital for repeat films   Those repeat films show a comminuted fracture of the lateral malleolus transverse displaced fracture of the medial malleolus an avulsion fracture of the posterior malleolus with subluxation of talus   Review of Systems  Constitutional: Negative for chills, fever and malaise/fatigue.  HENT: Positive for ear pain. Negative for nosebleeds.   Eyes: Negative for pain.  Respiratory: Negative for shortness of breath.   Cardiovascular: Negative for chest pain.  Gastrointestinal: Negative for heartburn.  Genitourinary: Negative for hematuria.  Musculoskeletal: Positive for falls.  Skin: Negative for itching and rash.  Neurological: Negative for focal weakness and headaches.  Endo/Heme/Allergies: Negative for environmental allergies and polydipsia. Does not bruise/bleed easily.  Psychiatric/Behavioral: Negative.          Past Medical History:  Diagnosis Date  . Anxiety    . Hypercholesteremia    . Hypertension    . Panic attacks    . PTSD (post-traumatic stress disorder)    . Seizures (HCC)    . Stroke (HCC)    . Trigeminal neuralgia             Past Surgical History:  Procedure Laterality Date  . ABDOMINOPLASTY      . APPENDECTOMY      .  bladder tack      . BRAIN SURGERY      . CERVICAL ABLATION      . Gamma knife Procedure November 2013      . TONSILLECTOMY             Family History  Problem Relation Age of Onset  . Depression Paternal Aunt    . Depression Paternal Uncle    . Depression Maternal Grandfather            Social History  Substance Use Topics  . Smoking status: Never Smoker  . Smokeless tobacco: Never Used  . Alcohol use No        Comment: occ      Current Outpatient Prescriptions:  .  cholecalciferol (GNP VITAMIN D) 400 UNITS TABS, Take 1 tablet by mouth Daily., Disp: , Rfl:  .  gabapentin (NEURONTIN) 600 MG tablet, Take 600 mg by mouth 4 (four) times daily. , Disp: , Rfl:  .  HYDROmorphone (DILAUDID) 2 MG tablet, Take 1 tablet (2 mg total) by mouth every 6 (six) hours as needed for moderate pain or severe pain. (Patient taking differently: Take 4 mg by mouth every 6 (six) hours as needed for moderate pain or severe pain. ), Disp: 15 tablet, Rfl: 0 .  ibuprofen (ADVIL,MOTRIN) 800 MG tablet, Take 800 mg by mouth 3 (three) times daily. , Disp: , Rfl:  .  levETIRAcetam (KEPPRA) 500 MG tablet, Take 1,500 mg by  mouth 3 (three) times daily. , Disp: , Rfl:  .  lisinopril (PRINIVIL) 20 MG tablet, Take 1 tablet (20 mg total) by mouth daily., Disp: 30 tablet, Rfl: 0 .  ondansetron (ZOFRAN ODT) 4 MG disintegrating tablet, Take 1 tablet (4 mg total) by mouth every 8 (eight) hours as needed for nausea or vomiting., Disp: 20 tablet, Rfl: 0 .  polyvinyl alcohol-povidone (REFRESH) 1.4-0.6 % ophthalmic solution, Apply 1-2 drops to eye daily as needed (for dry eye relief)., Disp: , Rfl:  .  tiZANidine (ZANAFLEX) 4 MG tablet, Take 4 mg by mouth 4 (four) times daily., Disp: , Rfl:  .  zolpidem (AMBIEN) 5 MG tablet, Take 5 mg by mouth at bedtime as needed. Sleep , Disp: , Rfl:    BP (!) 88/61   Pulse (!) 108   Ht 5' (1.524 m)   Wt 185 lb (83.9 kg)   BMI 36.13 kg/m    Physical Exam  Constitutional: She is oriented  to person, place, and time. She appears well-developed and well-nourished. She appears distressed.  HENT:  Head: Normocephalic and atraumatic.  Right Ear: External ear normal.  Left Ear: External ear normal.  Eyes: Conjunctivae and EOM are normal. Pupils are equal, round, and reactive to light. Right eye exhibits no discharge. Left eye exhibits no discharge. No scleral icterus.  Neck: Normal range of motion. Neck supple. No tracheal deviation present. No thyromegaly present.  Cardiovascular: Normal rate, regular rhythm and intact distal pulses.   Pulmonary/Chest: Effort normal. No respiratory distress. She has no wheezes. She has no rales. She exhibits no tenderness.  Abdominal: She exhibits no distension and no mass. There is no guarding. No hernia.  Lymphadenopathy:   She has no cervical adenopathy.  Neurological: She is alert and oriented to person, place, and time. She has normal reflexes. She exhibits normal muscle tone.  Skin: Skin is warm and dry. Capillary refill takes less than 2 seconds. No rash noted. She is not diaphoretic. No erythema. No pallor.  Psychiatric: She has a normal mood and affect. Her behavior is normal. Judgment and thought content normal.   coordination could not be assessed because the patient cannot stand up but her upper extremity finger point-2-point were normal   Ortho Exam Right and left upper extremity inspection reveals no abnormalities. There is no evidence of joint subluxation contracture subluxation atrophy tremor or joint luxation or muscle atrophy or increased muscle tone. Skin in sensation are normal good pulses and color are noted   Left lower extremity normal alignment normal motion normal strength normal stability normal skin normal pulse normal sensation   Right lower extremity the cast/splint had to be removed. The ankle mortise was displaced. We had to do a closed reduction and re-application of splint.       ASSESSMENT: My personal  interpretation of the images:  First set of films were not readable to determine surgery and I sent her back for repeat films the first set of films show a fracture and did not show the ankle mortise. The fracture involves the lateral medial malleolus but cannot be defined   Second set of films show lateral ankle mortise subluxation avulsion posterior malleolus fracture transverse medial malleolus comminuted fracture lateral malleolus       PLAN  orif right ankle

## 2016-06-03 NOTE — Progress Notes (Addendum)
Patient in Pacu since 33. Have given all pain meds ordered. Spoke with Dr Jayme Cloud who stated patient could have up to 2 more mg of Dilaudid because patient's pain remains at 9, despite resting for about 15 min, now hurting and yelling out in pain again. He feels she should be admitted for pain control. Dr Romeo Apple notified of all above and does not want patient to have any more pain medication. He will be over to assess patient after his current OR case.

## 2016-06-08 ENCOUNTER — Encounter (HOSPITAL_COMMUNITY): Payer: Self-pay | Admitting: Orthopedic Surgery

## 2016-06-08 ENCOUNTER — Ambulatory Visit: Payer: Self-pay | Admitting: Orthopedic Surgery

## 2016-06-10 ENCOUNTER — Ambulatory Visit (INDEPENDENT_AMBULATORY_CARE_PROVIDER_SITE_OTHER): Payer: Medicare Other | Admitting: Orthopedic Surgery

## 2016-06-10 ENCOUNTER — Encounter: Payer: Self-pay | Admitting: Orthopedic Surgery

## 2016-06-10 VITALS — BP 92/58 | HR 66 | Ht 62.0 in | Wt 185.0 lb

## 2016-06-10 DIAGNOSIS — Z4789 Encounter for other orthopedic aftercare: Secondary | ICD-10-CM | POA: Diagnosis not present

## 2016-06-10 DIAGNOSIS — S82851D Displaced trimalleolar fracture of right lower leg, subsequent encounter for closed fracture with routine healing: Secondary | ICD-10-CM

## 2016-06-10 NOTE — Progress Notes (Signed)
Postop visit #1 status post open treatment internal fixation of trimalleolar ankle fracture without fixation of posterior lip  Dressing change today.  Both wounds look clean dry and intact  Patient's main complaint is radicular pain in her right leg which is chronic  A new posterior splint is applied  Follow-up one week x-rays out of plaster and staples removal

## 2016-06-14 ENCOUNTER — Encounter: Payer: Self-pay | Admitting: Orthopedic Surgery

## 2016-06-14 ENCOUNTER — Telehealth: Payer: Self-pay | Admitting: Orthopedic Surgery

## 2016-06-14 ENCOUNTER — Ambulatory Visit: Payer: Medicare Other | Admitting: Orthopedic Surgery

## 2016-06-14 NOTE — Telephone Encounter (Signed)
As per Dr Romeo AppleHarrison, patient had new injury/fall over weekend and is to come in today, preferably this morning.  Called patient to offer appointment; states has no transportation for this morning - will try for this afternoon.  Emphasized importance of visit for today, 06/14/16.  Patient's ph#[phone] or [phone]

## 2016-06-15 NOTE — Telephone Encounter (Signed)
Patient was made aware of appointment for 06/14/16 and time was scheduled according to what patient said her transportation was available for, and patient was a no show 06/14/16.  Letter sent.

## 2016-06-16 ENCOUNTER — Encounter: Payer: Self-pay | Admitting: Orthopedic Surgery

## 2016-06-16 ENCOUNTER — Ambulatory Visit (INDEPENDENT_AMBULATORY_CARE_PROVIDER_SITE_OTHER): Payer: Medicare Other | Admitting: Orthopedic Surgery

## 2016-06-16 ENCOUNTER — Ambulatory Visit (INDEPENDENT_AMBULATORY_CARE_PROVIDER_SITE_OTHER): Payer: Medicare Other

## 2016-06-16 VITALS — BP 145/97 | HR 100 | Ht 62.0 in | Wt 185.0 lb

## 2016-06-16 DIAGNOSIS — Z4789 Encounter for other orthopedic aftercare: Secondary | ICD-10-CM

## 2016-06-16 DIAGNOSIS — S82891A Other fracture of right lower leg, initial encounter for closed fracture: Secondary | ICD-10-CM | POA: Diagnosis not present

## 2016-06-16 DIAGNOSIS — S82851D Displaced trimalleolar fracture of right lower leg, subsequent encounter for closed fracture with routine healing: Secondary | ICD-10-CM

## 2016-06-16 NOTE — Patient Instructions (Signed)
NO WEIGHT BEARING  

## 2016-06-16 NOTE — Progress Notes (Signed)
This is a postop visit status post open treatment internal fixation right ankle  Patient was noncompliant. Her husband and her split up she had to move she was left alone. She basically was weightbearing on the right foot  Today her incision was checked and her wounds were clean staples were removed for x-ray showed no change in the hardware  She's placed in a Cam Walker nonweightbearing follow-up x-ray 2 weeks

## 2016-06-22 ENCOUNTER — Telehealth: Payer: Self-pay | Admitting: Orthopedic Surgery

## 2016-06-22 NOTE — Telephone Encounter (Signed)
Patient is requesting something for pain, but does not want Hydromorphone 4mg . She is asking for Hydrocodone or Oxycodone. She said," Just something to knock the edge off".

## 2016-06-22 NOTE — Telephone Encounter (Signed)
Routing to Dr Harrison for review 

## 2016-06-23 ENCOUNTER — Other Ambulatory Visit: Payer: Self-pay | Admitting: *Deleted

## 2016-06-23 MED ORDER — HYDROCODONE-ACETAMINOPHEN 7.5-325 MG PO TABS: 1.0000 | ORAL_TABLET | Freq: Four times a day (QID) | ORAL | 0 refills | Status: DC | PRN

## 2016-06-23 NOTE — Telephone Encounter (Signed)
Norco 7.5 mg every 6 #28

## 2016-06-23 NOTE — Progress Notes (Unsigned)
hy

## 2016-06-30 ENCOUNTER — Encounter: Payer: Self-pay | Admitting: Orthopedic Surgery

## 2016-06-30 ENCOUNTER — Ambulatory Visit (INDEPENDENT_AMBULATORY_CARE_PROVIDER_SITE_OTHER): Payer: Medicare Other

## 2016-06-30 ENCOUNTER — Ambulatory Visit (INDEPENDENT_AMBULATORY_CARE_PROVIDER_SITE_OTHER): Payer: Medicare Other | Admitting: Orthopedic Surgery

## 2016-06-30 VITALS — BP 135/90 | HR 105 | Ht 62.0 in | Wt 206.0 lb

## 2016-06-30 DIAGNOSIS — IMO0001 Reserved for inherently not codable concepts without codable children: Secondary | ICD-10-CM

## 2016-06-30 DIAGNOSIS — S82851D Displaced trimalleolar fracture of right lower leg, subsequent encounter for closed fracture with routine healing: Secondary | ICD-10-CM

## 2016-06-30 DIAGNOSIS — Z4789 Encounter for other orthopedic aftercare: Secondary | ICD-10-CM

## 2016-06-30 DIAGNOSIS — T814XXA Infection following a procedure, initial encounter: Secondary | ICD-10-CM

## 2016-06-30 NOTE — Progress Notes (Signed)
Chief Complaint  Patient presents with  . Routine Post Op    Right Ankle ORIF DOS 06/03/16    Postop visit  The patient is 27 days postop she's been completely noncompliant each time she comes in whatever immobilization device we placed she has removed.  She comes in today with redness and serous drainage from her wound with swelling in the foot and along the lateral incision  Fortunately we have a stable x-ray with an intact mortise and no hardware loosening or lysis  I warned her again that she has to be compliant  I then placed her in an Aircast and dressed the wound  She will start on IV vancomycin with a PICC line with a wound check scheduled for Friday

## 2016-07-01 ENCOUNTER — Other Ambulatory Visit: Payer: Self-pay | Admitting: *Deleted

## 2016-07-01 ENCOUNTER — Ambulatory Visit (HOSPITAL_COMMUNITY)
Admission: RE | Admit: 2016-07-01 | Discharge: 2016-07-01 | Disposition: A | Discharge status: Home / Self Care | Payer: Medicare Other | Source: Ambulatory Visit | Attending: Orthopedic Surgery | Admitting: Orthopedic Surgery

## 2016-07-01 ENCOUNTER — Encounter (HOSPITAL_COMMUNITY)
Admission: RE | Admit: 2016-07-01 | Discharge: 2016-07-01 | Disposition: A | Discharge status: Home / Self Care | Payer: Medicare Other | Source: Ambulatory Visit | Attending: Orthopedic Surgery | Admitting: Orthopedic Surgery

## 2016-07-01 DIAGNOSIS — IMO0001 Reserved for inherently not codable concepts without codable children: Secondary | ICD-10-CM

## 2016-07-01 DIAGNOSIS — T814XXD Infection following a procedure, subsequent encounter: Secondary | ICD-10-CM | POA: Insufficient documentation

## 2016-07-01 DIAGNOSIS — T814XXA Infection following a procedure, initial encounter: Secondary | ICD-10-CM | POA: Diagnosis not present

## 2016-07-01 LAB — CBC WITH DIFFERENTIAL/PLATELET
BASOS PCT: 1 %
Basophils Absolute: 0.1 10*3/uL (ref 0.0–0.1)
EOS ABS: 0.5 10*3/uL (ref 0.0–0.7)
Eosinophils Relative: 7 %
HEMATOCRIT: 35.4 % — AB (ref 36.0–46.0)
Hemoglobin: 11.7 g/dL — ABNORMAL LOW (ref 12.0–15.0)
Lymphocytes Relative: 53 %
Lymphs Abs: 3.6 10*3/uL (ref 0.7–4.0)
MCH: 30.9 pg (ref 26.0–34.0)
MCHC: 33.1 g/dL (ref 30.0–36.0)
MCV: 93.4 fL (ref 78.0–100.0)
MONO ABS: 0.4 10*3/uL (ref 0.1–1.0)
MONOS PCT: 6 %
NEUTROS ABS: 2.2 10*3/uL (ref 1.7–7.7)
Neutrophils Relative %: 33 %
Platelets: 365 10*3/uL (ref 150–400)
RBC: 3.79 MIL/uL — ABNORMAL LOW (ref 3.87–5.11)
RDW: 13.4 % (ref 11.5–15.5)
WBC: 6.8 10*3/uL (ref 4.0–10.5)

## 2016-07-01 LAB — BASIC METABOLIC PANEL
Anion gap: 11 (ref 5–15)
BUN: 17 mg/dL (ref 6–20)
CALCIUM: 9.2 mg/dL (ref 8.9–10.3)
CO2: 20 mmol/L — AB (ref 22–32)
CREATININE: 1.1 mg/dL — AB (ref 0.44–1.00)
Chloride: 104 mmol/L (ref 101–111)
GFR calc non Af Amer: 60 mL/min — ABNORMAL LOW (ref >60)
Glucose, Bld: 119 mg/dL — ABNORMAL HIGH (ref 65–99)
Potassium: 4.2 mmol/L (ref 3.5–5.1)
Sodium: 135 mmol/L (ref 135–145)

## 2016-07-01 MED ORDER — VANCOMYCIN HCL IN DEXTROSE 1-5 GM/200ML-% IV SOLN
1000.0000 mg | INTRAVENOUS | Status: DC
  Administered 2016-07-01: 1000 mg via INTRAVENOUS

## 2016-07-01 MED ORDER — HEPARIN SOD (PORK) LOCK FLUSH 100 UNIT/ML IV SOLN
500.0000 [IU] | Freq: Once | INTRAVENOUS | Status: AC
  Administered 2016-07-01: 500 [IU] via INTRAVENOUS

## 2016-07-01 MED ORDER — VANCOMYCIN HCL 10 G IV SOLR: 1000.0000 mg | Freq: Every day | INTRAVENOUS | Status: AC

## 2016-07-01 MED ORDER — HEPARIN SOD (PORK) LOCK FLUSH 100 UNIT/ML IV SOLN
INTRAVENOUS | Status: AC
  Filled 2016-07-01: qty 5

## 2016-07-01 NOTE — Addendum Note (Signed)
Addended by: Adella HareBOOTHE, Artice Bergerson B on: 07/01/2016 09:14 AM   Modules accepted: Orders

## 2016-07-01 NOTE — Discharge Instructions (Signed)
PICC Home Guide A peripherally inserted central catheter (PICC) is a long, thin, flexible tube that is inserted into a vein in the upper arm. It is a form of intravenous (IV) access. It is considered to be a "central" line because the tip of the PICC ends in a large vein in your chest. This large vein is called the superior vena cava (SVC). The PICC tip ends in the SVC because there is a lot of blood flow in the SVC. This allows medicines and IV fluids to be quickly distributed throughout the body. The PICC is inserted using a sterile technique by a specially trained nurse or physician. After the PICC is inserted, a chest X-ray exam is done to be sure it is in the correct place.  A PICC may be placed for different reasons, such as:  To give medicines and liquid nutrition that can only be given through a central line. Examples are:  Certain antibiotic treatments.  Chemotherapy.  Total parenteral nutrition (TPN).  To take frequent blood samples.  To give IV fluids and blood products.  If there is difficulty placing a peripheral intravenous (PIV) catheter. If taken care of properly, a PICC can remain in place for several months. A PICC can also allow a person to go home from the hospital early. Medicine and PICC care can be managed at home by a family member or home health care team. WHAT PROBLEMS CAN HAPPEN WHEN I HAVE A PICC? Problems with a PICC can occasionally occur. These may include the following:  A blood clot (thrombus) forming in or at the tip of the PICC. This can cause the PICC to become clogged. A clot-dissolving medicine called tissue plasminogen activator (tPA) can be given through the PICC to help break up the clot.  Inflammation of the vein (phlebitis) in which the PICC is placed. Signs of inflammation may include redness, pain at the insertion site, red streaks, or being able to feel a "cord" in the vein where the PICC is located.  Infection in the PICC or at the insertion  site. Signs of infection may include fever, chills, redness, swelling, or pus drainage from the PICC insertion site.  PICC movement (malposition). The PICC tip may move from its original position due to excessive physical activity, forceful coughing, sneezing, or vomiting.  A break or cut in the PICC. It is important to not use scissors near the PICC.  Nerve or tendon irritation or injury during PICC insertion. WHAT SHOULD I KEEP IN MIND ABOUT ACTIVITIES WHEN I HAVE A PICC?  You may bend your arm and move it freely. If your PICC is near or at the bend of your elbow, avoid activity with repeated motion at the elbow.  Rest at home for the remainder of the day following PICC line insertion.  Avoid lifting heavy objects as instructed by your health care provider.  Avoid using a crutch with the arm on the same side as your PICC. You may need to use a walker. WHAT SHOULD I KNOW ABOUT MY PICC DRESSING?  Keep your PICC bandage (dressing) clean and dry to prevent infection.  Ask your health care provider when you may shower. Ask your health care provider to teach you how to wrap the PICC when you do take a shower.  Change the PICC dressing as instructed by your health care provider.  Change your PICC dressing if it becomes loose or wet. WHAT SHOULD I KNOW ABOUT PICC CARE?  Check the PICC insertion site   daily for leakage, redness, swelling, or pain.  Do not take a bath, swim, or use hot tubs when you have a PICC. Cover PICC line with clear plastic wrap and tape to keep it dry while showering.  Flush the PICC as directed by your health care provider. Let your health care provider know right away if the PICC is difficult to flush or does not flush. Do not use force to flush the PICC.  Do not use a syringe that is less than 10 mL to flush the PICC.  Never pull or tug on the PICC.  Avoid blood pressure checks on the arm with the PICC.  Keep your PICC identification card with you at all  times.  Do not take the PICC out yourself. Only a trained clinical professional should remove the PICC. SEEK IMMEDIATE MEDICAL CARE IF:  Your PICC is accidentally pulled all the way out. If this happens, cover the insertion site with a bandage or gauze dressing. Do not throw the PICC away. Your health care provider will need to inspect it.  Your PICC was tugged or pulled and has partially come out. Do not  push the PICC back in.  There is any type of drainage, redness, or swelling where the PICC enters the skin.  You cannot flush the PICC, it is difficult to flush, or the PICC leaks around the insertion site when it is flushed.  You hear a "flushing" sound when the PICC is flushed.  You have pain, discomfort, or numbness in your arm, shoulder, or jaw on the same side as the PICC.  You feel your heart "racing" or skipping beats.  You notice a hole or tear in the PICC.  You develop chills or a fever. MAKE SURE YOU:   Understand these instructions.  Will watch your condition.  Will get help right away if you are not doing well or get worse.   This information is not intended to replace advice given to you by your health care provider. Make sure you discuss any questions you have with your health care provider.   Document Released: 04/24/2003 Document Revised: 11/08/2014 Document Reviewed: 06/25/2013 Elsevier Interactive Patient Education 2016 [place] Insertion, Care After Refer to this sheet in the next few weeks. These instructions provide you with information on caring for yourself after your procedure. Your health care provider may also give you more specific instructions. Your treatment has been planned according to current medical practices, but problems sometimes occur. Call your health care provider if you have any problems or questions after your procedure. WHAT TO EXPECT AFTER THE PROCEDURE After your procedure, it is typical to have the following:  Mild  discomfort at the insertion site. This should not last more than a day. HOME CARE INSTRUCTIONS  Rest at home for the remainder of the day after the procedure.  You may bend your arm and move it freely. If your PICC is near or at the bend of your elbow, avoid activity with repeated motion at the elbow.  Avoid lifting heavy objects as instructed by your health care provider.  Avoid using a crutch with the arm on the same side as your PICC. You may need to use a walker. Bandage Care  Keep your PICC bandage (dressing) clean and dry to prevent infection.  Ask your health care provider when you may shower. To keep the dressing dry, cover the PICC with plastic wrap and tape before showering. If the dressing does become wet, replace  it right after the shower.  Do not soak in the bath, swim, or use hot tubs when you have a PICC.  Change the PICC dressing as instructed by your health care provider.  Change your PICC dressing if it becomes loose or wet. General PICC Care  Check the PICC insertion site daily for leakage, redness, swelling, or pain.  Flush the PICC as directed by your health care provider. Let your health care provider know right away if the PICC is difficult to flush or does not flush. Do not use force to flush the PICC.  Do not use a syringe that is less than 10 mL to flush the PICC.  Never pull or tug on the PICC.  Avoid blood pressure checks on the arm with the PICC.  Keep your PICC identification card with you at all times.  Do not take the PICC out yourself. Only a trained health care professional should remove the PICC. SEEK MEDICAL CARE IF:  You have pain in your arm, ear, face, or teeth.  You have fever or chills.  You have drainage from the PICC insertion site.  You have redness or palpate a "cord" around the PICC insertion site.  You cannot flush the catheter. SEEK IMMEDIATE MEDICAL CARE IF:  You have swelling in the arm in which the PICC is inserted.    This information is not intended to replace advice given to you by your health care provider. Make sure you discuss any questions you have with your health care provider.   Document Released: 08/08/2013 Document Revised: 10/23/2013 Document Reviewed: 08/08/2013 Elsevier Interactive Patient Education 2016 [employer] Peripherally Inserted Central Catheter/Midline Placement  The IV Nurse has discussed with the patient and/or persons authorized to consent for the patient, the purpose of this procedure and the potential benefits and risks involved with this procedure.  The benefits include less needle sticks, lab draws from the catheter and patient may be discharged home with the catheter.  Risks include, but not limited to, infection, bleeding, blood clot (thrombus formation), and puncture of an artery; nerve damage and irregular heat beat.  Alternatives to this procedure were also discussed.  PICC/Midline Placement Documentation  PICC Single Lumen 07/01/16 PICC Right Basilic 37 cm 0 cm (Active)  Indication for Insertion or Continuance of Line Administration of hyperosmolar/irritating solutions (i.e. TPN, Vancomycin, etc.) 07/01/2016  3:15 PM  Exposed Catheter (cm) 0 cm 07/01/2016  3:15 PM  Site Assessment Clean;Dry;Intact 07/01/2016  3:15 PM  Line Status Flushed;Blood return noted;Infusing 07/01/2016  3:15 PM  Dressing Type Transparent;Securing device 07/01/2016  3:15 PM  Dressing Status Clean;Dry;Intact;Antimicrobial disc in place 07/01/2016  3:15 PM  Line Care Cap(s) changed;Connections checked and tightened 07/01/2016  3:15 PM  Line Adjustment (NICU/IV Team Only) No 07/01/2016  3:15 PM  Dressing Intervention New dressing 07/01/2016  3:15 PM       Lesa Vandall, Meredith ModyGina S 07/01/2016, 3:21 PM

## 2016-07-01 NOTE — Addendum Note (Signed)
Addended by: Diamantina MonksBOOTHE, JAIME B on: 07/01/2016 09:38 AM   Modules accepted: Orders

## 2016-07-02 ENCOUNTER — Ambulatory Visit (INDEPENDENT_AMBULATORY_CARE_PROVIDER_SITE_OTHER): Payer: Medicare Other | Admitting: Orthopedic Surgery

## 2016-07-02 ENCOUNTER — Encounter (HOSPITAL_COMMUNITY)
Admission: RE | Admit: 2016-07-02 | Discharge: 2016-07-02 | Disposition: A | Discharge status: Home / Self Care | Payer: Medicare Other | Source: Ambulatory Visit | Attending: Orthopedic Surgery | Admitting: Orthopedic Surgery

## 2016-07-02 VITALS — BP 137/46 | HR 101 | Ht 62.0 in | Wt 206.0 lb

## 2016-07-02 DIAGNOSIS — L03115 Cellulitis of right lower limb: Secondary | ICD-10-CM | POA: Diagnosis not present

## 2016-07-02 DIAGNOSIS — S82851D Displaced trimalleolar fracture of right lower leg, subsequent encounter for closed fracture with routine healing: Secondary | ICD-10-CM

## 2016-07-02 DIAGNOSIS — IMO0001 Reserved for inherently not codable concepts without codable children: Secondary | ICD-10-CM

## 2016-07-02 DIAGNOSIS — Z4789 Encounter for other orthopedic aftercare: Secondary | ICD-10-CM

## 2016-07-02 DIAGNOSIS — T814XXA Infection following a procedure, initial encounter: Secondary | ICD-10-CM

## 2016-07-02 MED ORDER — VANCOMYCIN HCL IN DEXTROSE 1-5 GM/200ML-% IV SOLN
1000.0000 mg | INTRAVENOUS | Status: DC
  Administered 2016-07-02: 1000 mg via INTRAVENOUS

## 2016-07-02 MED ORDER — HYDROCODONE-ACETAMINOPHEN 7.5-325 MG PO TABS: 1.0000 | ORAL_TABLET | Freq: Four times a day (QID) | ORAL | 0 refills | Status: DC | PRN

## 2016-07-02 MED ORDER — HEPARIN SOD (PORK) LOCK FLUSH 100 UNIT/ML IV SOLN
INTRAVENOUS | Status: AC
  Filled 2016-07-02: qty 5

## 2016-07-02 MED ORDER — HEPARIN SOD (PORK) LOCK FLUSH 100 UNIT/ML IV SOLN
250.0000 [IU] | INTRAVENOUS | Status: AC | PRN
  Administered 2016-07-02: 250 [IU]

## 2016-07-02 MED ORDER — VANCOMYCIN HCL IN DEXTROSE 1-5 GM/200ML-% IV SOLN
INTRAVENOUS | Status: AC
  Filled 2016-07-02: qty 200

## 2016-07-02 NOTE — Progress Notes (Signed)
Patient ID: Mariah Ibarra, female   DOB: 09/17/1971, 45 y.o.   MRN: [id number]  Post op visit   Chief Complaint  Patient presents with  . Follow-up    Wound check on right ankle, DOS 06-03-16.    BP (!) 137/46   Pulse (!) 101   Ht 5\' 2"  (1.575 m)   Wt 206 lb (93.4 kg)   BMI 37.68 kg/m   Encounter Diagnoses  Name Primary?  . Fracture of ankle, trimalleolar, right, closed, with routine healing, subsequent encounter Yes  . Postoperative cellulitis of surgical wound, initial encounter   . Surgical aftercare, musculoskeletal system     Started IV vancomycin with PICC line. Foot already looks much better  I change the dressing  She has decreased erythema decreased swelling decreased pain    ASSESSMENT AND PLAN   Continue IV vancomycin, return 1 week for wound check  Meds ordered this encounter  Medications  . HYDROcodone-acetaminophen (NORCO) 7.5-325 MG tablet    Sig: Take 1 tablet by mouth every 6 (six) hours as needed for moderate pain.    Dispense:  28 tablet    Refill:  0

## 2016-07-02 NOTE — Patient Instructions (Signed)
Return 1 week  Continue IV antibiotics

## 2016-07-03 ENCOUNTER — Encounter (HOSPITAL_COMMUNITY)
Admission: RE | Admit: 2016-07-03 | Discharge: 2016-07-03 | Disposition: A | Discharge status: Home / Self Care | Payer: Medicare Other | Source: Ambulatory Visit | Attending: Orthopedic Surgery | Admitting: Orthopedic Surgery

## 2016-07-03 DIAGNOSIS — L03115 Cellulitis of right lower limb: Secondary | ICD-10-CM | POA: Diagnosis not present

## 2016-07-03 MED ORDER — VANCOMYCIN HCL IN DEXTROSE 1-5 GM/200ML-% IV SOLN
1000.0000 mg | Freq: Once | INTRAVENOUS | Status: DC
  Filled 2016-07-03: qty 200

## 2016-07-03 MED ORDER — HEPARIN SOD (PORK) LOCK FLUSH 100 UNIT/ML IV SOLN
250.0000 [IU] | INTRAVENOUS | Status: DC | PRN
  Filled 2016-07-03: qty 5

## 2016-07-03 MED ORDER — VANCOMYCIN HCL IN DEXTROSE 1-5 GM/200ML-% IV SOLN: 1000.0000 mg | Freq: Once | INTRAVENOUS | Status: DC

## 2016-07-04 ENCOUNTER — Encounter (HOSPITAL_COMMUNITY)
Admission: RE | Admit: 2016-07-04 | Discharge: 2016-07-04 | Disposition: A | Discharge status: Home / Self Care | Payer: Medicare Other | Source: Ambulatory Visit | Attending: Orthopedic Surgery | Admitting: Orthopedic Surgery

## 2016-07-04 DIAGNOSIS — L03115 Cellulitis of right lower limb: Secondary | ICD-10-CM | POA: Diagnosis not present

## 2016-07-04 MED ORDER — VANCOMYCIN HCL IN DEXTROSE 1-5 GM/200ML-% IV SOLN
1000.0000 mg | Freq: Once | INTRAVENOUS | Status: DC
  Filled 2016-07-04: qty 200

## 2016-07-04 MED ORDER — HEPARIN SOD (PORK) LOCK FLUSH 100 UNIT/ML IV SOLN
250.0000 [IU] | INTRAVENOUS | Status: DC | PRN
  Filled 2016-07-04: qty 5

## 2016-07-05 ENCOUNTER — Encounter (HOSPITAL_COMMUNITY)
Admission: RE | Admit: 2016-07-05 | Discharge: 2016-07-05 | Disposition: A | Discharge status: Home / Self Care | Payer: Medicare Other | Source: Ambulatory Visit | Attending: Orthopedic Surgery | Admitting: Orthopedic Surgery

## 2016-07-05 DIAGNOSIS — L03115 Cellulitis of right lower limb: Secondary | ICD-10-CM | POA: Diagnosis not present

## 2016-07-05 MED ORDER — HEPARIN SOD (PORK) LOCK FLUSH 100 UNIT/ML IV SOLN
250.0000 [IU] | INTRAVENOUS | Status: AC | PRN
  Administered 2016-07-06: 250 [IU]

## 2016-07-05 MED ORDER — VANCOMYCIN HCL IN DEXTROSE 1-5 GM/200ML-% IV SOLN
1000.0000 mg | INTRAVENOUS | Status: DC
Start: 2016-07-05 — End: 2016-07-06
  Filled 2016-07-05: qty 200

## 2016-07-06 ENCOUNTER — Encounter (HOSPITAL_COMMUNITY)
Admission: RE | Admit: 2016-07-06 | Discharge: 2016-07-06 | Disposition: A | Discharge status: Home / Self Care | Payer: Medicare Other | Source: Ambulatory Visit | Attending: Orthopedic Surgery | Admitting: Orthopedic Surgery

## 2016-07-06 DIAGNOSIS — L03115 Cellulitis of right lower limb: Secondary | ICD-10-CM | POA: Diagnosis not present

## 2016-07-06 MED ORDER — HEPARIN SOD (PORK) LOCK FLUSH 100 UNIT/ML IV SOLN
INTRAVENOUS | Status: AC
  Filled 2016-07-06: qty 5

## 2016-07-06 MED ORDER — SODIUM CHLORIDE 0.9 % IV SOLN
Freq: Once | INTRAVENOUS | Status: AC
  Administered 2016-07-06: 12:00:00 via INTRAVENOUS

## 2016-07-06 MED ORDER — VANCOMYCIN HCL IN DEXTROSE 1-5 GM/200ML-% IV SOLN
1000.0000 mg | Freq: Once | INTRAVENOUS | Status: AC
  Administered 2016-07-06: 1000 mg via INTRAVENOUS
  Filled 2016-07-06: qty 200

## 2016-07-07 ENCOUNTER — Encounter (HOSPITAL_COMMUNITY)
Admission: RE | Admit: 2016-07-07 | Discharge: 2016-07-07 | Disposition: A | Discharge status: Home / Self Care | Payer: Medicare Other | Source: Ambulatory Visit | Attending: Orthopedic Surgery | Admitting: Orthopedic Surgery

## 2016-07-07 DIAGNOSIS — L03115 Cellulitis of right lower limb: Secondary | ICD-10-CM | POA: Diagnosis not present

## 2016-07-07 MED ORDER — SODIUM CHLORIDE 0.9 % IV SOLN
Freq: Once | INTRAVENOUS | Status: AC
  Administered 2016-07-07: 250 mL via INTRAVENOUS

## 2016-07-07 MED ORDER — VANCOMYCIN HCL IN DEXTROSE 1-5 GM/200ML-% IV SOLN
1000.0000 mg | Freq: Once | INTRAVENOUS | Status: AC
  Administered 2016-07-07: 1000 mg via INTRAVENOUS

## 2016-07-07 MED ORDER — VANCOMYCIN HCL IN DEXTROSE 1-5 GM/200ML-% IV SOLN
INTRAVENOUS | Status: AC
  Filled 2016-07-07: qty 200

## 2016-07-07 MED ORDER — HEPARIN SOD (PORK) LOCK FLUSH 100 UNIT/ML IV SOLN
INTRAVENOUS | Status: AC
  Filled 2016-07-07: qty 5

## 2016-07-07 MED ORDER — HEPARIN SOD (PORK) LOCK FLUSH 100 UNIT/ML IV SOLN
250.0000 [IU] | INTRAVENOUS | Status: AC | PRN
  Administered 2016-07-07: 250 [IU]

## 2016-07-08 ENCOUNTER — Encounter (HOSPITAL_COMMUNITY)
Admission: RE | Admit: 2016-07-08 | Discharge: 2016-07-08 | Disposition: A | Discharge status: Home / Self Care | Payer: Medicare Other | Source: Ambulatory Visit | Attending: Orthopedic Surgery | Admitting: Orthopedic Surgery

## 2016-07-08 DIAGNOSIS — L03115 Cellulitis of right lower limb: Secondary | ICD-10-CM | POA: Diagnosis not present

## 2016-07-08 MED ORDER — HEPARIN SOD (PORK) LOCK FLUSH 100 UNIT/ML IV SOLN
INTRAVENOUS | Status: AC
  Filled 2016-07-08: qty 5

## 2016-07-08 MED ORDER — HEPARIN SOD (PORK) LOCK FLUSH 100 UNIT/ML IV SOLN: 250.0000 [IU] | INTRAVENOUS | Status: DC | PRN

## 2016-07-08 MED ORDER — VANCOMYCIN HCL IN DEXTROSE 1-5 GM/200ML-% IV SOLN
1000.0000 mg | Freq: Once | INTRAVENOUS | Status: AC
  Administered 2016-07-08: 1000 mg via INTRAVENOUS
  Filled 2016-07-08: qty 200

## 2016-07-08 MED ORDER — SODIUM CHLORIDE 0.9% FLUSH: 10.0000 mL | INTRAVENOUS | Status: DC | PRN

## 2016-07-08 MED ORDER — SODIUM CHLORIDE 0.9 % IV SOLN
INTRAVENOUS | Status: DC
  Administered 2016-07-08: 250 mL via INTRAVENOUS

## 2016-07-09 ENCOUNTER — Encounter: Payer: Self-pay | Admitting: Orthopedic Surgery

## 2016-07-09 ENCOUNTER — Encounter (HOSPITAL_COMMUNITY)
Admission: RE | Admit: 2016-07-09 | Discharge: 2016-07-09 | Disposition: A | Discharge status: Home / Self Care | Payer: Medicare Other | Source: Ambulatory Visit | Attending: Orthopedic Surgery | Admitting: Orthopedic Surgery

## 2016-07-09 ENCOUNTER — Ambulatory Visit (INDEPENDENT_AMBULATORY_CARE_PROVIDER_SITE_OTHER): Payer: Medicare Other | Admitting: Orthopedic Surgery

## 2016-07-09 ENCOUNTER — Ambulatory Visit (INDEPENDENT_AMBULATORY_CARE_PROVIDER_SITE_OTHER): Payer: Medicare Other

## 2016-07-09 VITALS — BP 146/95 | HR 85 | Ht 62.0 in | Wt 198.0 lb

## 2016-07-09 DIAGNOSIS — S82851D Displaced trimalleolar fracture of right lower leg, subsequent encounter for closed fracture with routine healing: Secondary | ICD-10-CM

## 2016-07-09 DIAGNOSIS — T814XXA Infection following a procedure, initial encounter: Secondary | ICD-10-CM

## 2016-07-09 DIAGNOSIS — L03115 Cellulitis of right lower limb: Secondary | ICD-10-CM | POA: Diagnosis not present

## 2016-07-09 DIAGNOSIS — IMO0001 Reserved for inherently not codable concepts without codable children: Secondary | ICD-10-CM

## 2016-07-09 DIAGNOSIS — Z4789 Encounter for other orthopedic aftercare: Secondary | ICD-10-CM

## 2016-07-09 MED ORDER — SODIUM CHLORIDE 0.9 % IV SOLN
INTRAVENOUS | Status: DC
Start: 2016-07-09 — End: 2016-07-10
  Administered 2016-07-09: 250 mL via INTRAVENOUS

## 2016-07-09 MED ORDER — VANCOMYCIN HCL IN DEXTROSE 1-5 GM/200ML-% IV SOLN
1000.0000 mg | Freq: Once | INTRAVENOUS | Status: AC
  Administered 2016-07-09: 1000 mg via INTRAVENOUS
  Filled 2016-07-09: qty 200

## 2016-07-09 MED ORDER — HEPARIN SOD (PORK) LOCK FLUSH 100 UNIT/ML IV SOLN
250.0000 [IU] | INTRAVENOUS | Status: AC | PRN
  Administered 2016-07-09: 250 [IU]

## 2016-07-09 MED ORDER — VANCOMYCIN HCL 10 G IV SOLR: 1000.0000 mg | Freq: Every day | INTRAVENOUS | Status: AC

## 2016-07-09 MED ORDER — HEPARIN SOD (PORK) LOCK FLUSH 100 UNIT/ML IV SOLN
INTRAVENOUS | Status: AC
  Filled 2016-07-09: qty 5

## 2016-07-09 NOTE — Patient Instructions (Signed)
You should not  be wearing  You should be wearing a brace

## 2016-07-09 NOTE — Progress Notes (Signed)
Patient ID: Mariah Ibarra, female   DOB: 12/23/1970, 45 y.o.   MRN: [id number]  Post op visit   Chief Complaint  Patient presents with  . Wound Check    RIGHT ANKLE, DOS 06/03/16    BP (!) 146/95   Pulse 85   Ht 5\' 2"  (1.575 m)   Wt 198 lb (89.8 kg)   BMI 36.21 kg/m   I felt it pop  vanco day 8  Wound looks better    ASSESSMENT AND PLAN   X-rays right ankle: no complications seen  Continue vancomycin  Again emphasized brace wear   Return 1 week

## 2016-07-09 NOTE — Addendum Note (Signed)
Addended by: Adella HareBOOTHE, Montie Swiderski B on: 07/09/2016 11:00 AM   Modules accepted: Orders

## 2016-07-10 ENCOUNTER — Encounter (HOSPITAL_COMMUNITY)
Admission: RE | Admit: 2016-07-10 | Discharge: 2016-07-10 | Disposition: A | Discharge status: Home / Self Care | Payer: Medicare Other | Source: Ambulatory Visit | Attending: Orthopedic Surgery | Admitting: Orthopedic Surgery

## 2016-07-10 DIAGNOSIS — L03115 Cellulitis of right lower limb: Secondary | ICD-10-CM | POA: Diagnosis not present

## 2016-07-10 MED ORDER — VANCOMYCIN HCL IN DEXTROSE 1-5 GM/200ML-% IV SOLN
1000.0000 mg | Freq: Once | INTRAVENOUS | Status: DC
  Filled 2016-07-10: qty 200

## 2016-07-11 ENCOUNTER — Encounter (HOSPITAL_COMMUNITY)
Admission: RE | Admit: 2016-07-11 | Discharge: 2016-07-11 | Disposition: A | Discharge status: Home / Self Care | Payer: Medicare Other | Source: Ambulatory Visit | Attending: Orthopedic Surgery | Admitting: Orthopedic Surgery

## 2016-07-11 DIAGNOSIS — IMO0001 Reserved for inherently not codable concepts without codable children: Secondary | ICD-10-CM

## 2016-07-11 DIAGNOSIS — T814XXD Infection following a procedure, subsequent encounter: Principal | ICD-10-CM

## 2016-07-11 DIAGNOSIS — L03115 Cellulitis of right lower limb: Secondary | ICD-10-CM | POA: Diagnosis not present

## 2016-07-11 MED ORDER — VANCOMYCIN HCL IN DEXTROSE 1-5 GM/200ML-% IV SOLN
1000.0000 mg | Freq: Once | INTRAVENOUS | Status: DC
  Filled 2016-07-11: qty 200

## 2016-07-12 ENCOUNTER — Encounter (HOSPITAL_COMMUNITY)
Admission: RE | Admit: 2016-07-12 | Discharge: 2016-07-12 | Disposition: A | Discharge status: Home / Self Care | Payer: Medicare Other | Source: Ambulatory Visit | Attending: Orthopedic Surgery | Admitting: Orthopedic Surgery

## 2016-07-12 DIAGNOSIS — L03115 Cellulitis of right lower limb: Secondary | ICD-10-CM | POA: Diagnosis not present

## 2016-07-12 MED ORDER — HEPARIN SOD (PORK) LOCK FLUSH 100 UNIT/ML IV SOLN
INTRAVENOUS | Status: AC
  Filled 2016-07-12: qty 5

## 2016-07-12 MED ORDER — SODIUM CHLORIDE 0.9 % IV SOLN
INTRAVENOUS | Status: DC
  Administered 2016-07-12: 14:00:00 via INTRAVENOUS

## 2016-07-12 MED ORDER — HEPARIN SOD (PORK) LOCK FLUSH 100 UNIT/ML IV SOLN
250.0000 [IU] | INTRAVENOUS | Status: AC | PRN
  Administered 2016-07-12: 250 [IU]

## 2016-07-12 MED ORDER — VANCOMYCIN HCL IN DEXTROSE 1-5 GM/200ML-% IV SOLN
INTRAVENOUS | Status: AC
  Filled 2016-07-12: qty 200

## 2016-07-12 MED ORDER — VANCOMYCIN HCL IN DEXTROSE 1-5 GM/200ML-% IV SOLN
1000.0000 mg | Freq: Once | INTRAVENOUS | Status: AC
  Administered 2016-07-12: 1000 mg via INTRAVENOUS

## 2016-07-12 NOTE — Progress Notes (Signed)
States that over the weekend a nurse grabbed her right arm where the PICC is. Nurse was afraid pt was going to fall. drsg to PICC line changed per nurse. New securing device applied and pt states that the PICC line "feels funny." "it was causing my heart to flip flop a lot last night. "It didn't feel good." PICC line drsg d/c'd to check insertion site. No exposed catheter seen. No 0 seen. PICC line pulled back til 0 seen at insertion site. New securing device applied with new drsg. New antimicrobial disc applied. Tolerated well. PICC line in place with good blood return and flushed with saline. Vancomycin infusing without difficulty. Tolerated well.

## 2016-07-13 ENCOUNTER — Encounter (HOSPITAL_COMMUNITY): Payer: Self-pay

## 2016-07-13 ENCOUNTER — Encounter (HOSPITAL_COMMUNITY)
Admission: RE | Admit: 2016-07-13 | Discharge: 2016-07-13 | Disposition: A | Discharge status: Home / Self Care | Payer: Medicare Other | Source: Ambulatory Visit | Attending: Orthopedic Surgery | Admitting: Orthopedic Surgery

## 2016-07-13 DIAGNOSIS — L03115 Cellulitis of right lower limb: Secondary | ICD-10-CM | POA: Diagnosis not present

## 2016-07-13 MED ORDER — VANCOMYCIN HCL IN DEXTROSE 1-5 GM/200ML-% IV SOLN
INTRAVENOUS | Status: AC
  Filled 2016-07-13: qty 200

## 2016-07-13 MED ORDER — HEPARIN SOD (PORK) LOCK FLUSH 100 UNIT/ML IV SOLN
INTRAVENOUS | Status: AC
  Filled 2016-07-13: qty 5

## 2016-07-13 MED ORDER — HEPARIN SOD (PORK) LOCK FLUSH 100 UNIT/ML IV SOLN
250.0000 [IU] | INTRAVENOUS | Status: AC | PRN
Start: 2016-07-13 — End: 2016-07-13
  Administered 2016-07-13: 250 [IU]

## 2016-07-13 MED ORDER — SODIUM CHLORIDE 0.9 % IV SOLN
Freq: Once | INTRAVENOUS | Status: AC
  Administered 2016-07-13: 14:00:00 via INTRAVENOUS

## 2016-07-13 MED ORDER — VANCOMYCIN HCL IN DEXTROSE 1-5 GM/200ML-% IV SOLN
1000.0000 mg | Freq: Once | INTRAVENOUS | Status: AC
  Administered 2016-07-13: 1000 mg via INTRAVENOUS

## 2016-07-14 ENCOUNTER — Ambulatory Visit (HOSPITAL_COMMUNITY)
Admission: RE | Admit: 2016-07-14 | Discharge: 2016-07-14 | Disposition: A | Discharge status: Home / Self Care | Payer: Medicare Other | Source: Ambulatory Visit | Attending: Orthopedic Surgery | Admitting: Orthopedic Surgery

## 2016-07-14 ENCOUNTER — Encounter (HOSPITAL_COMMUNITY)
Admission: RE | Admit: 2016-07-14 | Discharge: 2016-07-14 | Disposition: A | Discharge status: Home / Self Care | Payer: Medicare Other | Source: Ambulatory Visit | Attending: Orthopedic Surgery | Admitting: Orthopedic Surgery

## 2016-07-14 DIAGNOSIS — Z452 Encounter for adjustment and management of vascular access device: Secondary | ICD-10-CM | POA: Insufficient documentation

## 2016-07-14 DIAGNOSIS — L03115 Cellulitis of right lower limb: Secondary | ICD-10-CM | POA: Diagnosis not present

## 2016-07-14 MED ORDER — SODIUM CHLORIDE 0.9 % IV SOLN
Freq: Once | INTRAVENOUS | Status: AC
  Administered 2016-07-14: 14:00:00 via INTRAVENOUS

## 2016-07-14 MED ORDER — HEPARIN SOD (PORK) LOCK FLUSH 100 UNIT/ML IV SOLN
250.0000 [IU] | INTRAVENOUS | Status: AC | PRN
  Administered 2016-07-14: 250 [IU]
  Filled 2016-07-14: qty 5

## 2016-07-14 MED ORDER — VANCOMYCIN HCL IN DEXTROSE 1-5 GM/200ML-% IV SOLN
1000.0000 mg | Freq: Once | INTRAVENOUS | Status: AC
  Administered 2016-07-14: 1000 mg via INTRAVENOUS
  Filled 2016-07-14: qty 200

## 2016-07-14 NOTE — Progress Notes (Signed)
Pt continues to have a fluttering feeling in her chest so to ensure that PICC is still in proper position a portable chest xray was performed. PICC confirmed to be in position of the CAJ. No adjustments made.

## 2016-07-15 ENCOUNTER — Encounter (HOSPITAL_COMMUNITY)
Admission: RE | Admit: 2016-07-15 | Discharge: 2016-07-15 | Disposition: A | Discharge status: Home / Self Care | Payer: Medicare Other | Source: Ambulatory Visit | Attending: Orthopedic Surgery | Admitting: Orthopedic Surgery

## 2016-07-15 DIAGNOSIS — L03115 Cellulitis of right lower limb: Secondary | ICD-10-CM | POA: Diagnosis not present

## 2016-07-15 MED ORDER — VANCOMYCIN HCL IN DEXTROSE 1-5 GM/200ML-% IV SOLN
1000.0000 mg | Freq: Once | INTRAVENOUS | Status: AC
  Administered 2016-07-15: 1000 mg via INTRAVENOUS

## 2016-07-15 MED ORDER — HEPARIN SOD (PORK) LOCK FLUSH 100 UNIT/ML IV SOLN
INTRAVENOUS | Status: AC
  Filled 2016-07-15: qty 5

## 2016-07-15 MED ORDER — HEPARIN SOD (PORK) LOCK FLUSH 100 UNIT/ML IV SOLN
250.0000 [IU] | INTRAVENOUS | Status: AC | PRN
  Administered 2016-07-15: 250 [IU]

## 2016-07-15 MED ORDER — VANCOMYCIN HCL IN DEXTROSE 1-5 GM/200ML-% IV SOLN
INTRAVENOUS | Status: AC
  Filled 2016-07-15: qty 200

## 2016-07-15 MED ORDER — SODIUM CHLORIDE 0.9 % IV SOLN
INTRAVENOUS | Status: DC
  Administered 2016-07-15: 13:00:00 via INTRAVENOUS

## 2016-07-16 ENCOUNTER — Encounter (HOSPITAL_COMMUNITY)
Admission: RE | Admit: 2016-07-16 | Discharge: 2016-07-16 | Disposition: A | Discharge status: Home / Self Care | Payer: Medicare Other | Source: Ambulatory Visit | Attending: Orthopedic Surgery | Admitting: Orthopedic Surgery

## 2016-07-16 ENCOUNTER — Ambulatory Visit (INDEPENDENT_AMBULATORY_CARE_PROVIDER_SITE_OTHER): Payer: Medicare Other | Admitting: Orthopedic Surgery

## 2016-07-16 ENCOUNTER — Encounter: Payer: Self-pay | Admitting: Orthopedic Surgery

## 2016-07-16 VITALS — BP 119/47 | HR 57 | Ht 62.0 in | Wt 194.0 lb

## 2016-07-16 DIAGNOSIS — T814XXA Infection following a procedure, initial encounter: Secondary | ICD-10-CM | POA: Diagnosis not present

## 2016-07-16 DIAGNOSIS — L03115 Cellulitis of right lower limb: Secondary | ICD-10-CM | POA: Diagnosis not present

## 2016-07-16 DIAGNOSIS — IMO0001 Reserved for inherently not codable concepts without codable children: Secondary | ICD-10-CM

## 2016-07-16 DIAGNOSIS — Z4789 Encounter for other orthopedic aftercare: Secondary | ICD-10-CM

## 2016-07-16 DIAGNOSIS — S82851D Displaced trimalleolar fracture of right lower leg, subsequent encounter for closed fracture with routine healing: Secondary | ICD-10-CM

## 2016-07-16 LAB — CBC
HEMATOCRIT: 38.1 % (ref 35.0–45.0)
HEMOGLOBIN: 12.5 g/dL (ref 11.7–15.5)
MCH: 30 pg (ref 27.0–33.0)
MCHC: 32.8 g/dL (ref 32.0–36.0)
MCV: 91.4 fL (ref 80.0–100.0)
MPV: 10.8 fL (ref 7.5–12.5)
Platelets: 366 10*3/uL (ref 140–400)
RBC: 4.17 MIL/uL (ref 3.80–5.10)
RDW: 14 % (ref 11.0–15.0)
WBC: 5.3 10*3/uL (ref 3.8–10.8)

## 2016-07-16 MED ORDER — SODIUM CHLORIDE 0.9 % IV SOLN
Freq: Once | INTRAVENOUS | Status: AC
  Administered 2016-07-16: 250 mL via INTRAVENOUS

## 2016-07-16 MED ORDER — HEPARIN SOD (PORK) LOCK FLUSH 100 UNIT/ML IV SOLN
250.0000 [IU] | INTRAVENOUS | Status: AC | PRN
  Administered 2016-07-16: 250 [IU]

## 2016-07-16 MED ORDER — HEPARIN SOD (PORK) LOCK FLUSH 100 UNIT/ML IV SOLN
INTRAVENOUS | Status: AC
  Filled 2016-07-16: qty 5

## 2016-07-16 MED ORDER — HYDROCODONE-ACETAMINOPHEN 7.5-325 MG PO TABS: 1.0000 | ORAL_TABLET | Freq: Four times a day (QID) | ORAL | 0 refills | Status: DC | PRN

## 2016-07-16 MED ORDER — VANCOMYCIN HCL IN DEXTROSE 1-5 GM/200ML-% IV SOLN
1000.0000 mg | Freq: Once | INTRAVENOUS | Status: AC
  Administered 2016-07-16: 1000 mg via INTRAVENOUS

## 2016-07-16 MED ORDER — VANCOMYCIN HCL IN DEXTROSE 1-5 GM/200ML-% IV SOLN
INTRAVENOUS | Status: AC
  Filled 2016-07-16: qty 200

## 2016-07-16 MED ORDER — HEPARIN SOD (PORK) LOCK FLUSH 100 UNIT/ML IV SOLN: 250.0000 [IU] | INTRAVENOUS | Status: DC | PRN

## 2016-07-16 MED ORDER — SODIUM CHLORIDE 0.9 % IV SOLN
INTRAVENOUS | Status: DC
  Administered 2016-07-16: 250 mL via INTRAVENOUS

## 2016-07-16 MED ORDER — VANCOMYCIN HCL 10 G IV SOLR: 1000.0000 mg | Freq: Every day | INTRAVENOUS | Status: AC

## 2016-07-16 NOTE — Addendum Note (Signed)
Addended by: Adella HareBOOTHE, [place] B on: 07/16/2016 11:17 AM   Modules accepted: Orders

## 2016-07-16 NOTE — Addendum Note (Signed)
Addended by: Fuller CanadaHARRISON, STANLEY E on: 07/16/2016 12:06 PM   Modules accepted: Orders

## 2016-07-16 NOTE — Progress Notes (Signed)
Follow-up visit status post ORIF right ankle 06/03/2016  She's 14 days on vancomycin IV  She is going to continue her IV treatments follow-up in a week  We will order a sedimentation rate C-reactive protein and white count  Wound is improving

## 2016-07-17 ENCOUNTER — Encounter (HOSPITAL_COMMUNITY)
Admission: RE | Admit: 2016-07-17 | Discharge: 2016-07-17 | Disposition: A | Discharge status: Home / Self Care | Payer: Medicare Other | Source: Ambulatory Visit | Attending: Orthopedic Surgery | Admitting: Orthopedic Surgery

## 2016-07-17 DIAGNOSIS — L03115 Cellulitis of right lower limb: Secondary | ICD-10-CM | POA: Diagnosis not present

## 2016-07-17 LAB — SEDIMENTATION RATE: SED RATE: 11 mm/h (ref 0–20)

## 2016-07-17 LAB — C-REACTIVE PROTEIN: CRP: 9.1 mg/L — ABNORMAL HIGH (ref <8.0)

## 2016-07-17 MED ORDER — HEPARIN SOD (PORK) LOCK FLUSH 100 UNIT/ML IV SOLN
250.0000 [IU] | INTRAVENOUS | Status: AC | PRN
  Administered 2016-07-17: 250 [IU]

## 2016-07-17 MED ORDER — SODIUM CHLORIDE 0.9 % IV SOLN
Freq: Once | INTRAVENOUS | Status: AC
  Administered 2016-07-17: 14:00:00 via INTRAVENOUS

## 2016-07-17 MED ORDER — VANCOMYCIN HCL IN DEXTROSE 1-5 GM/200ML-% IV SOLN
1000.0000 mg | Freq: Once | INTRAVENOUS | Status: AC
  Administered 2016-07-17: 1000 mg via INTRAVENOUS

## 2016-07-18 ENCOUNTER — Encounter (HOSPITAL_COMMUNITY)
Admission: RE | Admit: 2016-07-18 | Discharge: 2016-07-18 | Disposition: A | Discharge status: Home / Self Care | Payer: Medicare Other | Source: Ambulatory Visit | Attending: Orthopedic Surgery | Admitting: Orthopedic Surgery

## 2016-07-19 ENCOUNTER — Other Ambulatory Visit: Payer: Self-pay | Admitting: *Deleted

## 2016-07-19 ENCOUNTER — Telehealth: Payer: Self-pay | Admitting: Orthopedic Surgery

## 2016-07-19 ENCOUNTER — Encounter (HOSPITAL_COMMUNITY)
Admission: RE | Admit: 2016-07-19 | Discharge: 2016-07-19 | Disposition: A | Discharge status: Home / Self Care | Payer: Medicare Other | Source: Ambulatory Visit | Attending: Orthopedic Surgery | Admitting: Orthopedic Surgery

## 2016-07-19 DIAGNOSIS — L03115 Cellulitis of right lower limb: Secondary | ICD-10-CM | POA: Diagnosis not present

## 2016-07-19 MED ORDER — SODIUM CHLORIDE 0.9 % IV SOLN: 1000.0000 mg | Freq: Every day | INTRAVENOUS | Status: AC

## 2016-07-19 MED ORDER — VANCOMYCIN HCL IN DEXTROSE 1-5 GM/200ML-% IV SOLN
INTRAVENOUS | Status: AC
  Filled 2016-07-19: qty 200

## 2016-07-19 MED ORDER — VANCOMYCIN HCL IN DEXTROSE 1-5 GM/200ML-% IV SOLN
1000.0000 mg | Freq: Once | INTRAVENOUS | Status: AC
  Administered 2016-07-19: 1000 mg via INTRAVENOUS

## 2016-07-19 MED ORDER — HEPARIN SOD (PORK) LOCK FLUSH 100 UNIT/ML IV SOLN
INTRAVENOUS | Status: AC
  Filled 2016-07-19: qty 5

## 2016-07-19 MED ORDER — SODIUM CHLORIDE 0.9 % IV SOLN
Freq: Once | INTRAVENOUS | Status: AC
  Administered 2016-07-19: 250 mL via INTRAVENOUS

## 2016-07-19 MED ORDER — HEPARIN SOD (PORK) LOCK FLUSH 100 UNIT/ML IV SOLN
250.0000 [IU] | INTRAVENOUS | Status: AC | PRN
  Administered 2016-07-19: 250 [IU]

## 2016-07-19 NOTE — Telephone Encounter (Signed)
ORDERS ARE IN EPIC 

## 2016-07-19 NOTE — Telephone Encounter (Signed)
[facility] [place] [facility] [phone] - requests orders per Dr Romeo AppleHarrison from Friday, 07/16/16 - please fax to# [phone]

## 2016-07-20 ENCOUNTER — Encounter (HOSPITAL_COMMUNITY)
Admission: RE | Admit: 2016-07-20 | Discharge: 2016-07-20 | Disposition: A | Discharge status: Home / Self Care | Payer: Medicare Other | Source: Ambulatory Visit | Attending: Orthopedic Surgery | Admitting: Orthopedic Surgery

## 2016-07-20 DIAGNOSIS — L03115 Cellulitis of right lower limb: Secondary | ICD-10-CM | POA: Diagnosis not present

## 2016-07-20 MED ORDER — HEPARIN SOD (PORK) LOCK FLUSH 100 UNIT/ML IV SOLN
INTRAVENOUS | Status: AC
  Filled 2016-07-20: qty 5

## 2016-07-20 MED ORDER — SODIUM CHLORIDE 0.9 % IV SOLN
INTRAVENOUS | Status: DC
  Administered 2016-07-20: 11:00:00 via INTRAVENOUS

## 2016-07-20 MED ORDER — VANCOMYCIN HCL IN DEXTROSE 1-5 GM/200ML-% IV SOLN
1000.0000 mg | Freq: Once | INTRAVENOUS | Status: AC
  Administered 2016-07-20: 1000 mg via INTRAVENOUS
  Filled 2016-07-20: qty 200

## 2016-07-20 MED ORDER — HEPARIN SOD (PORK) LOCK FLUSH 100 UNIT/ML IV SOLN
250.0000 [IU] | INTRAVENOUS | Status: AC | PRN
  Administered 2016-07-20: 250 [IU]

## 2016-07-21 ENCOUNTER — Encounter (HOSPITAL_COMMUNITY)
Admission: RE | Admit: 2016-07-21 | Discharge: 2016-07-21 | Disposition: A | Discharge status: Home / Self Care | Payer: Medicare Other | Source: Ambulatory Visit | Attending: Orthopedic Surgery | Admitting: Orthopedic Surgery

## 2016-07-21 DIAGNOSIS — L03115 Cellulitis of right lower limb: Secondary | ICD-10-CM | POA: Diagnosis not present

## 2016-07-21 MED ORDER — HEPARIN SOD (PORK) LOCK FLUSH 100 UNIT/ML IV SOLN
250.0000 [IU] | INTRAVENOUS | Status: AC | PRN
  Administered 2016-07-21: 250 [IU]
  Filled 2016-07-21: qty 5

## 2016-07-21 MED ORDER — SODIUM CHLORIDE 0.9 % IV SOLN
INTRAVENOUS | Status: DC
  Administered 2016-07-21: 11:00:00 via INTRAVENOUS

## 2016-07-21 MED ORDER — VANCOMYCIN HCL IN DEXTROSE 1-5 GM/200ML-% IV SOLN
1000.0000 mg | Freq: Once | INTRAVENOUS | Status: AC
  Administered 2016-07-21: 1000 mg via INTRAVENOUS
  Filled 2016-07-21: qty 200

## 2016-07-22 ENCOUNTER — Encounter (HOSPITAL_COMMUNITY)
Admission: RE | Admit: 2016-07-22 | Discharge: 2016-07-22 | Disposition: A | Discharge status: Home / Self Care | Payer: Medicare Other | Source: Ambulatory Visit | Attending: Orthopedic Surgery | Admitting: Orthopedic Surgery

## 2016-07-22 DIAGNOSIS — L03115 Cellulitis of right lower limb: Secondary | ICD-10-CM | POA: Diagnosis not present

## 2016-07-22 MED ORDER — HEPARIN SOD (PORK) LOCK FLUSH 100 UNIT/ML IV SOLN
INTRAVENOUS | Status: AC
  Filled 2016-07-22: qty 5

## 2016-07-22 MED ORDER — VANCOMYCIN HCL IN DEXTROSE 1-5 GM/200ML-% IV SOLN
1000.0000 mg | Freq: Once | INTRAVENOUS | Status: AC
  Administered 2016-07-22: 1000 mg via INTRAVENOUS

## 2016-07-22 MED ORDER — SODIUM CHLORIDE 0.9 % IV SOLN: INTRAVENOUS | Status: DC

## 2016-07-22 MED ORDER — HEPARIN SOD (PORK) LOCK FLUSH 100 UNIT/ML IV SOLN
250.0000 [IU] | INTRAVENOUS | Status: AC | PRN
  Administered 2016-07-22: 250 [IU]

## 2016-07-23 ENCOUNTER — Encounter (HOSPITAL_COMMUNITY)
Admission: RE | Admit: 2016-07-23 | Discharge: 2016-07-23 | Disposition: A | Discharge status: Home / Self Care | Payer: Medicare Other | Source: Ambulatory Visit | Attending: Orthopedic Surgery | Admitting: Orthopedic Surgery

## 2016-07-23 ENCOUNTER — Ambulatory Visit (INDEPENDENT_AMBULATORY_CARE_PROVIDER_SITE_OTHER): Payer: Medicare Other | Admitting: Orthopedic Surgery

## 2016-07-23 ENCOUNTER — Encounter: Payer: Self-pay | Admitting: Orthopedic Surgery

## 2016-07-23 DIAGNOSIS — L03115 Cellulitis of right lower limb: Secondary | ICD-10-CM | POA: Diagnosis not present

## 2016-07-23 DIAGNOSIS — S82851D Displaced trimalleolar fracture of right lower leg, subsequent encounter for closed fracture with routine healing: Secondary | ICD-10-CM

## 2016-07-23 DIAGNOSIS — Z4789 Encounter for other orthopedic aftercare: Secondary | ICD-10-CM

## 2016-07-23 DIAGNOSIS — IMO0001 Reserved for inherently not codable concepts without codable children: Secondary | ICD-10-CM

## 2016-07-23 DIAGNOSIS — T814XXA Infection following a procedure, initial encounter: Secondary | ICD-10-CM

## 2016-07-23 MED ORDER — SODIUM CHLORIDE 0.9 % IV SOLN
INTRAVENOUS | Status: DC
  Administered 2016-07-23: 12:00:00 via INTRAVENOUS

## 2016-07-23 MED ORDER — VANCOMYCIN HCL 10 G IV SOLR: 1000.0000 mg | Freq: Every day | INTRAVENOUS | Status: AC

## 2016-07-23 MED ORDER — VANCOMYCIN HCL IN DEXTROSE 1-5 GM/200ML-% IV SOLN
INTRAVENOUS | Status: AC
  Filled 2016-07-23: qty 200

## 2016-07-23 MED ORDER — VANCOMYCIN HCL IN DEXTROSE 1-5 GM/200ML-% IV SOLN
1000.0000 mg | Freq: Once | INTRAVENOUS | Status: AC
  Administered 2016-07-23: 1000 mg via INTRAVENOUS

## 2016-07-23 MED ORDER — HEPARIN SOD (PORK) LOCK FLUSH 100 UNIT/ML IV SOLN
250.0000 [IU] | INTRAVENOUS | Status: AC | PRN
  Administered 2016-07-23: 250 [IU]
  Filled 2016-07-23: qty 5

## 2016-07-23 MED ORDER — HYDROCODONE-ACETAMINOPHEN 7.5-325 MG PO TABS: 1.0000 | ORAL_TABLET | Freq: Four times a day (QID) | ORAL | 0 refills | Status: DC | PRN

## 2016-07-23 NOTE — Progress Notes (Signed)
Patient ID: Mariah Ibarra, female   DOB: 02/26/1971, 45 y.o.   MRN: [id number]  Post op visit   Chief Complaint  Patient presents with  . Follow-up    Post op recheck on wound of right ankle, DOS 06-03-16.    Ht 5\' 2"  (1.575 m)   Wt 194 lb (88 kg)   BMI 35.48 kg/m   The wound has improved There is one area that has not epithelialized  No surrounding erythema. Ankle motion is improved as well.  Come back in a week after continue vancomycin for x-rays.

## 2016-07-23 NOTE — Addendum Note (Signed)
Addended by: Adella HareBOOTHE, Nichole Neyer B on: 07/23/2016 12:20 PM   Modules accepted: Orders

## 2016-07-24 ENCOUNTER — Encounter (HOSPITAL_COMMUNITY)
Admission: RE | Admit: 2016-07-24 | Discharge: 2016-07-24 | Disposition: A | Discharge status: Home / Self Care | Payer: Medicare Other | Source: Ambulatory Visit | Attending: Orthopedic Surgery | Admitting: Orthopedic Surgery

## 2016-07-24 DIAGNOSIS — L03115 Cellulitis of right lower limb: Secondary | ICD-10-CM | POA: Diagnosis not present

## 2016-07-24 MED ORDER — VANCOMYCIN HCL IN DEXTROSE 1-5 GM/200ML-% IV SOLN
1000.0000 mg | Freq: Once | INTRAVENOUS | Status: DC
Start: 2016-07-24 — End: 2016-07-25
  Filled 2016-07-24: qty 200

## 2016-07-25 ENCOUNTER — Encounter (HOSPITAL_COMMUNITY)
Admission: RE | Admit: 2016-07-25 | Discharge: 2016-07-25 | Disposition: A | Discharge status: Home / Self Care | Payer: Medicare Other | Source: Ambulatory Visit | Attending: Orthopedic Surgery | Admitting: Orthopedic Surgery

## 2016-07-25 DIAGNOSIS — L03115 Cellulitis of right lower limb: Secondary | ICD-10-CM | POA: Diagnosis not present

## 2016-07-25 MED ORDER — VANCOMYCIN HCL IN DEXTROSE 1-5 GM/200ML-% IV SOLN
1000.0000 mg | Freq: Once | INTRAVENOUS | Status: DC
  Filled 2016-07-25: qty 200

## 2016-07-25 MED ORDER — HEPARIN SOD (PORK) LOCK FLUSH 100 UNIT/ML IV SOLN
250.0000 [IU] | Freq: Once | INTRAVENOUS | Status: DC
  Filled 2016-07-25: qty 3

## 2016-07-26 ENCOUNTER — Encounter (HOSPITAL_COMMUNITY)
Admission: RE | Admit: 2016-07-26 | Discharge: 2016-07-26 | Disposition: A | Discharge status: Home / Self Care | Payer: Medicare Other | Source: Ambulatory Visit | Attending: Orthopedic Surgery | Admitting: Orthopedic Surgery

## 2016-07-26 DIAGNOSIS — L03115 Cellulitis of right lower limb: Secondary | ICD-10-CM | POA: Diagnosis not present

## 2016-07-26 MED ORDER — HEPARIN SOD (PORK) LOCK FLUSH 100 UNIT/ML IV SOLN
250.0000 [IU] | INTRAVENOUS | Status: AC | PRN
  Administered 2016-07-26: 250 [IU]

## 2016-07-26 MED ORDER — SODIUM CHLORIDE 0.9 % IV SOLN
INTRAVENOUS | Status: DC
  Administered 2016-07-26: 250 mL via INTRAVENOUS

## 2016-07-26 MED ORDER — VANCOMYCIN HCL IN DEXTROSE 1-5 GM/200ML-% IV SOLN
INTRAVENOUS | Status: AC
  Filled 2016-07-26: qty 200

## 2016-07-26 MED ORDER — SODIUM CHLORIDE 0.9 % IV SOLN: INTRAVENOUS | Status: DC

## 2016-07-26 MED ORDER — HEPARIN SOD (PORK) LOCK FLUSH 100 UNIT/ML IV SOLN
INTRAVENOUS | Status: AC
  Filled 2016-07-26: qty 5

## 2016-07-26 MED ORDER — VANCOMYCIN HCL IN DEXTROSE 1-5 GM/200ML-% IV SOLN
1000.0000 mg | Freq: Once | INTRAVENOUS | Status: AC
Start: 2016-07-26 — End: 2016-07-26
  Administered 2016-07-26: 1000 mg via INTRAVENOUS

## 2016-07-27 ENCOUNTER — Ambulatory Visit: Payer: Self-pay | Admitting: Orthopedic Surgery

## 2016-07-27 ENCOUNTER — Encounter (HOSPITAL_COMMUNITY)
Admission: RE | Admit: 2016-07-27 | Discharge: 2016-07-27 | Disposition: A | Discharge status: Home / Self Care | Payer: Medicare Other | Source: Ambulatory Visit | Attending: Orthopedic Surgery | Admitting: Orthopedic Surgery

## 2016-07-27 DIAGNOSIS — L03115 Cellulitis of right lower limb: Secondary | ICD-10-CM | POA: Diagnosis not present

## 2016-07-27 MED ORDER — HEPARIN SOD (PORK) LOCK FLUSH 100 UNIT/ML IV SOLN: 250.0000 [IU] | INTRAVENOUS | Status: DC | PRN

## 2016-07-27 MED ORDER — VANCOMYCIN HCL IN DEXTROSE 1-5 GM/200ML-% IV SOLN
INTRAVENOUS | Status: AC
  Filled 2016-07-27: qty 200

## 2016-07-27 MED ORDER — SODIUM CHLORIDE 0.9 % IV SOLN
INTRAVENOUS | Status: DC
  Administered 2016-07-27: 250 mL via INTRAVENOUS

## 2016-07-27 MED ORDER — HEPARIN SOD (PORK) LOCK FLUSH 100 UNIT/ML IV SOLN
INTRAVENOUS | Status: AC
  Filled 2016-07-27: qty 5

## 2016-07-27 MED ORDER — VANCOMYCIN HCL IN DEXTROSE 1-5 GM/200ML-% IV SOLN
1000.0000 mg | Freq: Once | INTRAVENOUS | Status: AC
  Administered 2016-07-27: 1000 mg via INTRAVENOUS

## 2016-07-27 NOTE — Progress Notes (Signed)
Drsg to right ankle changed. Incision to right ankle intact and without active drainage. Right ankle slightly swollen. Right toes pink in color/warm to touch. Xeroform gauze and 2"x2" gauze applied. 4" ace wrap applied. Tolerated well.

## 2016-07-28 ENCOUNTER — Encounter (HOSPITAL_COMMUNITY)
Admission: RE | Admit: 2016-07-28 | Discharge: 2016-07-28 | Disposition: A | Discharge status: Home / Self Care | Payer: Medicare Other | Source: Ambulatory Visit | Attending: Orthopedic Surgery | Admitting: Orthopedic Surgery

## 2016-07-28 DIAGNOSIS — L03115 Cellulitis of right lower limb: Secondary | ICD-10-CM | POA: Diagnosis not present

## 2016-07-28 MED ORDER — VANCOMYCIN HCL IN DEXTROSE 1-5 GM/200ML-% IV SOLN
INTRAVENOUS | Status: AC
  Filled 2016-07-28: qty 200

## 2016-07-28 MED ORDER — SODIUM CHLORIDE 0.9 % IV SOLN: INTRAVENOUS | Status: DC

## 2016-07-28 MED ORDER — VANCOMYCIN HCL IN DEXTROSE 1-5 GM/200ML-% IV SOLN
1000.0000 mg | INTRAVENOUS | Status: DC
  Administered 2016-07-28: 1000 mg via INTRAVENOUS

## 2016-07-28 MED ORDER — VANCOMYCIN HCL IN DEXTROSE 1-5 GM/200ML-% IV SOLN
1000.0000 mg | Freq: Once | INTRAVENOUS | Status: AC
  Administered 2016-07-28: 1000 mg via INTRAVENOUS

## 2016-07-28 MED ORDER — HEPARIN SOD (PORK) LOCK FLUSH 100 UNIT/ML IV SOLN
250.0000 [IU] | INTRAVENOUS | Status: AC | PRN
  Administered 2016-07-28: 250 [IU]
  Filled 2016-07-28: qty 5

## 2016-07-29 ENCOUNTER — Encounter (HOSPITAL_COMMUNITY)
Admission: RE | Admit: 2016-07-29 | Discharge: 2016-07-29 | Disposition: A | Discharge status: Home / Self Care | Payer: Medicare Other | Source: Ambulatory Visit | Attending: Orthopedic Surgery | Admitting: Orthopedic Surgery

## 2016-07-29 DIAGNOSIS — L03115 Cellulitis of right lower limb: Secondary | ICD-10-CM | POA: Diagnosis not present

## 2016-07-29 MED ORDER — HEPARIN SOD (PORK) LOCK FLUSH 100 UNIT/ML IV SOLN
500.0000 [IU] | Freq: Once | INTRAVENOUS | Status: AC
  Administered 2016-07-29: 500 [IU] via INTRAVENOUS

## 2016-07-29 MED ORDER — VANCOMYCIN HCL IN DEXTROSE 1-5 GM/200ML-% IV SOLN
INTRAVENOUS | Status: AC
  Filled 2016-07-29: qty 200

## 2016-07-29 MED ORDER — HEPARIN SOD (PORK) LOCK FLUSH 100 UNIT/ML IV SOLN
INTRAVENOUS | Status: AC
  Filled 2016-07-29: qty 5

## 2016-07-29 MED ORDER — VANCOMYCIN HCL IN DEXTROSE 1-5 GM/200ML-% IV SOLN
1000.0000 mg | INTRAVENOUS | Status: DC
  Administered 2016-07-29: 1000 mg via INTRAVENOUS

## 2016-07-30 ENCOUNTER — Ambulatory Visit (INDEPENDENT_AMBULATORY_CARE_PROVIDER_SITE_OTHER): Payer: Medicare Other

## 2016-07-30 ENCOUNTER — Encounter (HOSPITAL_COMMUNITY)
Admission: RE | Admit: 2016-07-30 | Discharge: 2016-07-30 | Disposition: A | Discharge status: Home / Self Care | Payer: Medicare Other | Source: Ambulatory Visit | Attending: Orthopedic Surgery | Admitting: Orthopedic Surgery

## 2016-07-30 ENCOUNTER — Ambulatory Visit (INDEPENDENT_AMBULATORY_CARE_PROVIDER_SITE_OTHER): Payer: Medicare Other | Admitting: Orthopedic Surgery

## 2016-07-30 VITALS — BP 132/99 | HR 78 | Ht 62.0 in | Wt 192.0 lb

## 2016-07-30 DIAGNOSIS — S82891A Other fracture of right lower leg, initial encounter for closed fracture: Secondary | ICD-10-CM

## 2016-07-30 NOTE — Patient Instructions (Signed)
Wear your brace

## 2016-07-30 NOTE — Progress Notes (Signed)
Follow up  Chief Complaint  Patient presents with  . Follow-up    Recheck right ankle with xrays, DOS 06-03-16.   BP (!) 132/99   Pulse 78   Ht 5\' 2"  (1.575 m)   Wt 192 lb (87.1 kg)   BMI 35.12 kg/m   Weeks postop bimalleolar fracture with open treatment internal fixation, treated by wound infection treated with IV vancomycin x 4 weeks   The wound infection has resolved. She has 1 little area of about 4 mm with some epidermal tissue missing but there is no surrounding erythema or tenderness  She does have swelling and tenderness on the medial side of the ankle  Her x-rays look good  PICC line was taken out at 37 mm  X-ray in 4 weeks. Patient was in today walking without her brace again.

## 2016-08-18 DIAGNOSIS — R1084 Generalized abdominal pain: Secondary | ICD-10-CM | POA: Diagnosis not present

## 2016-08-18 DIAGNOSIS — Z79899 Other long term (current) drug therapy: Secondary | ICD-10-CM | POA: Diagnosis not present

## 2016-08-18 DIAGNOSIS — G500 Trigeminal neuralgia: Secondary | ICD-10-CM | POA: Diagnosis not present

## 2016-08-18 DIAGNOSIS — G43711 Chronic migraine without aura, intractable, with status migrainosus: Secondary | ICD-10-CM | POA: Diagnosis not present

## 2016-08-18 DIAGNOSIS — F419 Anxiety disorder, unspecified: Secondary | ICD-10-CM | POA: Diagnosis not present

## 2016-08-20 DIAGNOSIS — G43711 Chronic migraine without aura, intractable, with status migrainosus: Secondary | ICD-10-CM | POA: Diagnosis not present

## 2016-08-20 DIAGNOSIS — F419 Anxiety disorder, unspecified: Secondary | ICD-10-CM | POA: Diagnosis not present

## 2016-08-20 DIAGNOSIS — R1084 Generalized abdominal pain: Secondary | ICD-10-CM | POA: Diagnosis not present

## 2016-08-20 DIAGNOSIS — G500 Trigeminal neuralgia: Secondary | ICD-10-CM | POA: Diagnosis not present

## 2016-08-27 ENCOUNTER — Encounter: Payer: Self-pay | Admitting: Orthopedic Surgery

## 2016-10-12 ENCOUNTER — Encounter (HOSPITAL_COMMUNITY): Payer: Self-pay | Admitting: Emergency Medicine

## 2016-10-12 ENCOUNTER — Emergency Department (HOSPITAL_COMMUNITY): Payer: Medicare Other

## 2016-10-12 ENCOUNTER — Emergency Department (HOSPITAL_COMMUNITY)
Admission: EM | Admit: 2016-10-12 | Discharge: 2016-10-12 | Disposition: A | Discharge status: Home / Self Care | Payer: Medicare Other | Attending: Emergency Medicine | Admitting: Emergency Medicine

## 2016-10-12 ENCOUNTER — Telehealth: Payer: Self-pay | Admitting: Orthopedic Surgery

## 2016-10-12 DIAGNOSIS — Z791 Long term (current) use of non-steroidal anti-inflammatories (NSAID): Secondary | ICD-10-CM | POA: Diagnosis not present

## 2016-10-12 DIAGNOSIS — Y999 Unspecified external cause status: Secondary | ICD-10-CM | POA: Insufficient documentation

## 2016-10-12 DIAGNOSIS — Y929 Unspecified place or not applicable: Secondary | ICD-10-CM | POA: Diagnosis not present

## 2016-10-12 DIAGNOSIS — Y9389 Activity, other specified: Secondary | ICD-10-CM | POA: Diagnosis not present

## 2016-10-12 DIAGNOSIS — M79661 Pain in right lower leg: Secondary | ICD-10-CM | POA: Diagnosis not present

## 2016-10-12 DIAGNOSIS — G40909 Epilepsy, unspecified, not intractable, without status epilepticus: Secondary | ICD-10-CM | POA: Insufficient documentation

## 2016-10-12 DIAGNOSIS — S92321A Displaced fracture of second metatarsal bone, right foot, initial encounter for closed fracture: Secondary | ICD-10-CM

## 2016-10-12 DIAGNOSIS — S8991XA Unspecified injury of right lower leg, initial encounter: Secondary | ICD-10-CM | POA: Diagnosis not present

## 2016-10-12 DIAGNOSIS — I10 Essential (primary) hypertension: Secondary | ICD-10-CM | POA: Insufficient documentation

## 2016-10-12 DIAGNOSIS — S99921A Unspecified injury of right foot, initial encounter: Secondary | ICD-10-CM | POA: Diagnosis present

## 2016-10-12 DIAGNOSIS — Z79899 Other long term (current) drug therapy: Secondary | ICD-10-CM | POA: Diagnosis not present

## 2016-10-12 DIAGNOSIS — W19XXXA Unspecified fall, initial encounter: Secondary | ICD-10-CM | POA: Diagnosis not present

## 2016-10-12 DIAGNOSIS — R569 Unspecified convulsions: Secondary | ICD-10-CM

## 2016-10-12 LAB — I-STAT CHEM 8, ED
BUN: 12 mg/dL (ref 6–20)
CALCIUM ION: 0.91 mmol/L — AB (ref 1.15–1.40)
Chloride: 107 mmol/L (ref 101–111)
Creatinine, Ser: 1.3 mg/dL — ABNORMAL HIGH (ref 0.44–1.00)
Glucose, Bld: 93 mg/dL (ref 65–99)
HCT: 41 % (ref 36.0–46.0)
HEMOGLOBIN: 13.9 g/dL (ref 12.0–15.0)
Potassium: 4.6 mmol/L (ref 3.5–5.1)
SODIUM: 137 mmol/L (ref 135–145)
TCO2: 23 mmol/L (ref 0–100)

## 2016-10-12 LAB — CBG MONITORING, ED: Glucose-Capillary: 109 mg/dL — ABNORMAL HIGH (ref 65–99)

## 2016-10-12 MED ORDER — LEVETIRACETAM 500 MG PO TABS
1500.0000 mg | ORAL_TABLET | Freq: Once | ORAL | Status: AC
  Administered 2016-10-12: 1500 mg via ORAL
  Filled 2016-10-12: qty 3

## 2016-10-12 MED ORDER — HYDROMORPHONE HCL 2 MG PO TABS
4.0000 mg | ORAL_TABLET | Freq: Once | ORAL | Status: AC
  Administered 2016-10-12: 4 mg via ORAL
  Filled 2016-10-12: qty 2

## 2016-10-12 MED ORDER — GABAPENTIN 300 MG PO CAPS
600.0000 mg | ORAL_CAPSULE | Freq: Once | ORAL | Status: AC
  Administered 2016-10-12: 600 mg via ORAL
  Filled 2016-10-12: qty 2

## 2016-10-12 MED ORDER — LACOSAMIDE 50 MG PO TABS: 50.0000 mg | ORAL_TABLET | Freq: Two times a day (BID) | ORAL | Status: DC

## 2016-10-12 MED ORDER — LACOSAMIDE 50 MG PO TABS
50.0000 mg | ORAL_TABLET | Freq: Once | ORAL | Status: AC
  Administered 2016-10-12: 50 mg via ORAL
  Filled 2016-10-12: qty 1

## 2016-10-12 MED ORDER — SODIUM CHLORIDE 0.9 % IV SOLN
1500.0000 mg | Freq: Once | INTRAVENOUS | Status: DC
  Filled 2016-10-12: qty 15

## 2016-10-12 NOTE — Telephone Encounter (Signed)
Patient called to relay that she has had a seizure today, and a fall, and said has possibly re-injured the same ankle/lower leg that Dr Romeo AppleHarrison treated August through October of this year.  Recommended, per our protocol, that she will need to go to Emergency room for complete work-up.  States she will go on to [facility] Emergency department.

## 2016-10-12 NOTE — ED Triage Notes (Signed)
Pt reports she had a seizure and fell injuring her R foot. Pt had surgery for previous fracture.

## 2016-10-12 NOTE — Discharge Instructions (Signed)
Do not bear weight on your foot. Use crutches or wheelchair. Follow-up with your orthopedic surgeon.  Return for worsening symptoms, including multiple back to back seizures, confusion, or any other symptoms concerning to you.

## 2016-10-12 NOTE — ED Provider Notes (Signed)
[facility]-EMERGENCY DEPT Provider Note   CSN: [id number] Arrival date & time: 10/12/16  1802     History   Chief Complaint Chief Complaint  Patient presents with  . Foot Pain    HPI Mariah Ibarra is a 45 y.o. female.  HPI 45 year old female who presents with seizure and right ankle pain. She has A history of ORIF of her right ankle 5-6 months ago by Dr. Romeo Apple. Also history of seizure disorder on Keppra, gabapentin, and lacosamide. State having her typical seizure earlier today while opening the refrigerator. It was witnessed by her fianc who states that her seizure lasted less than 1 minute before self resolving. She is currently at her baseline mental status. Didn't twist her ankle with her seizure and since then has had swelling and pain to the right foot. Took her home oral Dilaudid for pain control, with some good effect but having difficulty bearing weight. States that her father recently passed away and she has been under a lot of stress, this is likely causing her breakthrough seizure. Does state that she often has breakthrough seizures, and this is not uncommon for her. No fevers, confusion, nausea or vomiting, diarrhea, chest pain or difficulty breathing, headache, or other traumatic injuries.  Past Medical History:  Diagnosis Date  . Anxiety   . Hypercholesteremia   . Hypertension   . Panic attacks   . PONV (postoperative nausea and vomiting)   . PTSD (post-traumatic stress disorder)   . Seizures (HCC)    2 months since last seizure.  . Stroke [facility])    short term memory loss from 3rd brain surgery.  . Trigeminal neuralgia     Patient Active Problem List   Diagnosis Date Noted  . Fracture of ankle, trimalleolar, right, closed   . Thrush 12/03/2012  . Otitis externa, left 12/03/2012  . SIADH (syndrome of inappropriate ADH production) (HCC) 12/02/2012  . Hyponatremia 12/01/2012  . Seizure (HCC) 12/01/2012  . High anion gap metabolic acidosis 12/01/2012  .  Hypokalemia 12/01/2012  . Depression 12/01/2012  . Anxiety 12/01/2012  . Ataxia 08/24/2012  . Trigeminal neuralgia 08/24/2012  . Hypertension 08/24/2012  . Hyperlipidemia 08/24/2012    Past Surgical History:  Procedure Laterality Date  . ABDOMINAL HYSTERECTOMY    . ABDOMINOPLASTY    . APPENDECTOMY    . bladder tack    . BRAIN SURGERY     x4  . CERVICAL ABLATION    . Gamma knife Procedure November 2013    . ORIF ANKLE FRACTURE Right 06/03/2016   Procedure: OPEN REDUCTION INTERNAL FIXATION (ORIF) RIGHT ANKLE;  Surgeon: Vickki Hearing, MD;  Location: [facility] ORS;  Service: Orthopedics;  Laterality: Right;  . TONSILLECTOMY      OB History    Gravida Para Term Preterm AB Living   0 0 0 0 0     SAB TAB Ectopic Multiple Live Births   0 0 0           Home Medications    Prior to Admission medications   Medication Sig Start Date End Date Taking? Authorizing Provider  cholecalciferol (GNP VITAMIN D) 400 UNITS TABS Take 1 tablet by mouth Daily.    Historical Provider, MD  gabapentin (NEURONTIN) 600 MG tablet Take 600 mg by mouth 3 (three) times daily.     Historical Provider, MD  HYDROcodone-acetaminophen (NORCO) 7.5-325 MG tablet Take 1 tablet by mouth every 6 (six) hours as needed for moderate pain. 07/23/16   Duffy Rhody  Elita QuickE Harrison, MD  ibuprofen (ADVIL,MOTRIN) 800 MG tablet Take 800 mg by mouth 3 (three) times daily.     Historical Provider, MD  levETIRAcetam (KEPPRA) 500 MG tablet Take 1,500 mg by mouth 3 (three) times daily.  05/20/16   Historical Provider, MD  lisinopril (PRINIVIL) 20 MG tablet Take 1 tablet (20 mg total) by mouth daily. 12/03/12   Christiane Haorinna L Sullivan, MD  ondansetron (ZOFRAN ODT) 4 MG disintegrating tablet Take 1 tablet (4 mg total) by mouth every 8 (eight) hours as needed for nausea or vomiting. 12/29/13   Gerhard Munchobert Lockwood, MD  pioglitazone (ACTOS) 15 MG tablet Take 1 tablet by mouth daily. 03/23/16   Historical Provider, MD  polyvinyl alcohol-povidone (REFRESH) 1.4-0.6 %  ophthalmic solution Apply 1-2 drops to eye daily as needed (for dry eye relief).    Historical Provider, MD  tiZANidine (ZANAFLEX) 4 MG tablet Take 4 mg by mouth 4 (four) times daily.    Historical Provider, MD  zolpidem (AMBIEN) 5 MG tablet Take 5 mg by mouth at bedtime as needed. Sleep     Historical Provider, MD    Family History Family History  Problem Relation Age of Onset  . Depression Paternal Aunt   . Depression Paternal Uncle   . Depression Maternal Grandfather     Social History Social History  Substance Use Topics  . Smoking status: Never Smoker  . Smokeless tobacco: Never Used  . Alcohol use No     Comment: occ     Allergies   Baclofen and Lorazepam   Review of Systems Review of Systems 10/14 systems reviewed and are negative other than those stated in the HPI   Physical Exam Updated Vital Signs BP 115/68 (BP Location: Left Arm)   Pulse 78   Temp 98 F (36.7 C) (Oral)   Resp 17   Ht 5\' 2"  (1.575 m)   Wt 190 lb (86.2 kg)   SpO2 92%   BMI 34.75 kg/m   Physical Exam Physical Exam  Nursing note and vitals reviewed. Constitutional: Well developed, well nourished, non-toxic, and in no acute distress Head: Normocephalic and atraumatic.  Mouth/Throat: Oropharynx is clear and moist.  Neck: Normal range of motion. Neck supple. no cervical spine tenderness Eyes: PERRL, EOMI Cardiovascular: Normal rate and regular rhythm.   Pulmonary/Chest: Effort normal and breath sounds normal. no chest wall tenderness Abdominal: Soft. There is no tenderness. There is no rebound and no guarding.  Musculoskeletal: soft tissue swelling of the right ankle with limited ROM and bruising over mid tibia/fibulra that is tender to palpation.   Neurological: Alert, no facial droop, fluent speech, moves all extremities symmetrically, sensation to light touch in tact throughout Skin: Skin is warm and dry.  Psychiatric: Cooperative   ED Treatments / Results  Labs (all labs ordered  are listed, but only abnormal results are displayed) Labs Reviewed  I-STAT CHEM 8, ED - Abnormal; Notable for the following:       Result Value   Creatinine, Ser 1.30 (*)    Calcium, Ion 0.91 (*)    All other components within normal limits  CBG MONITORING, ED - Abnormal; Notable for the following:    Glucose-Capillary 109 (*)    All other components within normal limits    EKG  EKG Interpretation  Date/Time:  Tuesday October 12 2016 19:51:48 EST Ventricular Rate:  67 PR Interval:    QRS Duration: 84 QT Interval:  412 QTC Calculation: 435 R Axis:   77 Text Interpretation:  Sinus rhythm Baseline wander in lead(s) V1 V2 V3 No significant change since last tracing Confirmed by Litzi Binning MD, Ellesse Antenucci [phone]([id number]) on 10/12/2016 8:02:02 PM       Radiology Dg Tibia/fibula Right  Result Date: 10/12/2016 CLINICAL DATA:  Larey SeatFell wall having seizure today pain in the mid to right lower leg with radiation to the ankle EXAM: RIGHT TIBIA AND FIBULA - 2 VIEW COMPARISON:  07/30/2016 FINDINGS: No acute fracture or malalignments. Stable surgical plate and screw fixation of the distal fibula and screw fixation of the distal tibia. Old avulsion injuries off the medial and lateral malleoli. Soft tissues are unremarkable. IMPRESSION: Postsurgical changes of the distal fibula and tibia. No acute osseous abnormality. Electronically Signed   By: Jasmine PangKim  Fujinaga M.D.   On: 10/12/2016 20:21   Dg Foot Complete Right  Result Date: 10/12/2016 CLINICAL DATA:  Right foot pain after seizure today. EXAM: RIGHT FOOT COMPLETE - 3+ VIEW COMPARISON:  None. FINDINGS: Mildly displaced fracture is seen involving the proximal base of the second metatarsal. Intra-articular extension is noted. No soft tissue abnormality is noted. IMPRESSION: Mildly displaced fracture involving proximal base of second metatarsal with intra-articular extension. Electronically Signed   By: Lupita RaiderJames  Green Jr, M.D.   On: 10/12/2016 19:42    Procedures Procedures  (including critical care time)  Medications Ordered in ED Medications  HYDROmorphone (DILAUDID) tablet 4 mg (not administered)  gabapentin (NEURONTIN) capsule 600 mg (600 mg Oral Given 10/12/16 1952)  lacosamide (VIMPAT) tablet 50 mg (50 mg Oral Given 10/12/16 1953)  levETIRAcetam (KEPPRA) tablet 1,500 mg (1,500 mg Oral Given 10/12/16 1953)     Initial Impression / Assessment and Plan / ED Course  I have reviewed the triage vital signs and the nursing notes.  Pertinent labs & imaging results that were available during my care of the patient were reviewed by me and considered in my medical decision making (see chart for details).  Clinical Course    Presents after her typical seizure with right foot pain. She is at baseline mental status with normal neuro exam and normal vital signs. No electrolyte or metabolic derangements. Is given her evening dose of seizure medications. With right foot and ankle swelling after her fall. X-rays are visualized and shows evidence of the base of second metatarsal fracture. Posterior splint is applied and she will follow-up with Dr. Romeo AppleHarrison who she has seen before for close follow-up.  Stable for discharge home. NWB on RLE and has wheelchair and crutches at home. Strict return and follow-up instructions reviewed. She expressed understanding of all discharge instructions and felt comfortable with the plan of care.   Final Clinical Impressions(s) / ED Diagnoses   Final diagnoses:  Displaced fracture of second metatarsal bone, right foot, initial encounter for closed fracture  Seiz[facility](HCC)    New Prescriptions New Prescriptions   No medications on file     Lavera Guiseana Duo Kwane Rohl, MD 10/12/16 2040

## 2016-10-12 NOTE — ED Notes (Signed)
Applied short-leg splint to right leg patient tolerated well.

## 2016-10-15 ENCOUNTER — Ambulatory Visit (INDEPENDENT_AMBULATORY_CARE_PROVIDER_SITE_OTHER): Payer: Medicare Other | Admitting: Orthopedic Surgery

## 2016-10-15 DIAGNOSIS — S93324A Dislocation of tarsometatarsal joint of right foot, initial encounter: Secondary | ICD-10-CM | POA: Diagnosis not present

## 2016-10-15 MED ORDER — HYDROCODONE-ACETAMINOPHEN 7.5-325 MG PO TABS: 1.0000 | ORAL_TABLET | ORAL | 0 refills | Status: DC | PRN

## 2016-10-15 NOTE — Patient Instructions (Addendum)
No weight bearing   CT scan at [facility] 10/19/16 6:30pm   Lisfranc Midfoot Injury A Lisfranc midfoot injury is a break (fracture), separation (dislocation), or sprain involving the bones and joints in the middle of your foot. Your midfoot is made up of a cluster of small bones (tarsals) that attach to the long bones going to your toes (metatarsals). Strong bands of tissue (ligaments) attach the bones of your midfoot to each other. A Lisfranc midfoot injury can include:  Ligament damage or tears.  Bone fractures.  Dislocations.  A combination of these injuries. This type of injury can cause foot pain and instability. The condition can range from mild to severe. What are the causes? This condition may be caused by:  An injury that twists your foot forcefully.  Tripping or falling over your foot.  Falling from a height.  A hard, direct hit or a crushing injury to your foot. What increases the risk? This condition is more likely to develop in athletes who participate in:  Contact sports, especially while wearing cleats, like in football.  Activities in which your foot is loaded in a pointed position like dance and gymnastics. What are the signs or symptoms? Symptoms of this condition include:  Foot pain that gets worse with standing or walking.  Foot swelling.  Bruising on the top or bottom of the foot.  Pain when pressing on the top or bottom of the foot (tenderness).  Seeing or feeling a new bump on the top of your foot. How is this diagnosed? This condition is diagnosed based on your symptoms and medical history, especially if you recently had an injury. Your health care provider will also do a physical exam to check for bruising under your foot andmove your toes to test for pain. You may be asked if you can stand up on tiptoe. You may also have imaging studies done, including:  X-rays.  CT scans.  MRI. How is this treated? The first treatments for ligament  injuries that do not cause instability or dislocation of the midfoot are usually nonsurgical. These may include:  Using a boot or cast to keep your foot still and protect it while it heals (immobilization). You may need to wear this for at least 6 weeks or as told by your health care provider.  Using crutches to keep weight off your foot.  Physical therapy to improve motion and strength.  Medicine for pain. Surgery is the treatment for fractures, dislocations, and unstable ligament injuries. This may include ligament repair, joint fusion, or a procedure to stabilize the fracture using screws or plates. After surgery, you will have to wear a cast and eventually have physical therapy. Follow these instructions at home: If you have a boot:  Wear the boot as told by your health care provider. Remove it only as told by your health care provider.  Loosen the boot if your toes tingle, become numb, or turn cold and blue.  Do not let your boot get wet if it is not waterproof.  Keep the boot clean. If you have a cast:  Do not stick anything inside the cast to scratch your skin. Doing that increases your risk of infection.  Check the skin around the cast every day. Tell your health care provider about any concerns.  You may put lotion on dry skin around the edges of the cast. Do not put lotion on the skin underneath the cast.  Do not let your cast get wet if it  is not waterproof.  Keep the cast clean. Bathing  Do not take baths, swim, or use a hot tub until your health care provider approves. Ask your health care provider if you can take showers. You may only be allowed to take sponge baths for bathing.  If your boot or cast is not waterproof, protect it with a watertight covering when you take a bath or a shower. Managing pain, stiffness, and swelling  If directed, apply ice to the injured area.  If you have a removable boot, remove it as told by your health care provider.  Put ice in  a plastic bag.  Place a towel between the bag and your skin or between the bag and your cast.  Leave the ice on for 20 minutes, 2-3 times a day.  Move your toes often to avoid stiffness and to lessen swelling.  Raise (elevate) the injured area above the level of your heart while you are sitting or lying down. Driving  Do not drive or operate heavy machinery while taking prescription pain medicine.  Ask your health care provider when it is safe to drive if you have a boot or cast on your foot. Activity  Return to your normal activities as told by your health care provider. Ask your health care provider what activities are safe for you.  Do exercises as told by your health care provider. Safety  Do not use the injured limb to support your body weight until your health care provider says that you can. Use your crutches as told by your health care provider. General instructions  Do not put pressure on any part of the cast until it is fully hardened. This may take several hours.  Do not use any tobacco products, such as cigarettes, chewing tobacco, and e-cigarettes. Tobacco can delay healing. If you need help quitting, ask your health care provider.  Take over-the-counter and prescription medicines only as told by your health care provider.  Keep all follow-up visits as told by your health care provider. This is important. How is this prevented?  Make sure to use equipment that fits you.  Use footwear that is appropriate for your athletic activity and the playing surface.  Be safe and responsible while being active to avoid falls. Contact a health care provider if:  Your pain medicine and rest are not helping. Get help right away if:  Your foot becomes very painful or numb.  Your toes become very pale or turn blue. This information is not intended to replace advice given to you by your health care provider. Make sure you discuss any questions you have with your health care  provider. Document Released: 10/18/2005 Document Revised: 06/22/2016 Document Reviewed: 07/02/2015 Elsevier Interactive Patient Education  2017 [employer]

## 2016-10-15 NOTE — Progress Notes (Signed)
Patient ID: Mariah Ibarra, female   DOB: 10/04/1971, 45 y.o.   MRN: [id number]  Right foot pain injury date December 5  HPI Mariah Ibarra is a 45 y.o. female.   HPI 45 year old female had a right ankle fracture, complicated by postop infection requiring vancomycin IV antibiotics, she had a seizure thinks she caught her right foot on the refrigerator injured her right foot she is a second metatarsal fracture and Lisfranc's joint comes in complaining of pain swelling 10 days and painful weightbearing  Review of Systems Review of Systems  Constitutional: Negative for fever.  Musculoskeletal: Positive for gait problem and joint swelling.  Neurological: Positive for seizures.     Past Medical History:  Diagnosis Date  . Anxiety   . Hypercholesteremia   . Hypertension   . Panic attacks   . PONV (postoperative nausea and vomiting)   . PTSD (post-traumatic stress disorder)   . Seizures (HCC)    2 months since last seizure.  . Stroke [facility](HCC)    short term memory loss from 3rd brain surgery.  . Trigeminal neuralgia     Past Surgical History:  Procedure Laterality Date  . ABDOMINAL HYSTERECTOMY    . ABDOMINOPLASTY    . APPENDECTOMY    . bladder tack    . BRAIN SURGERY     x4  . CERVICAL ABLATION    . Gamma knife Procedure November 2013    . ORIF ANKLE FRACTURE Right 06/03/2016   Procedure: OPEN REDUCTION INTERNAL FIXATION (ORIF) RIGHT ANKLE;  Surgeon: Vickki HearingStanley E Dazani Norby, MD;  Location: [facility] ORS;  Service: Orthopedics;  Laterality: Right;  . TONSILLECTOMY        Social History Social History  Substance Use Topics  . Smoking status: Never Smoker  . Smokeless tobacco: Never Used  . Alcohol use No     Comment: occ    Allergies  Allergen Reactions  . Baclofen Other (See Comments)    Seizure activity  . Lorazepam Other (See Comments)    Doesn't tolerate    Current Outpatient Prescriptions  Medication Sig Dispense Refill  . cholecalciferol (GNP VITAMIN D) 400 UNITS  TABS Take 1 tablet by mouth Daily.    Marland Kitchen. gabapentin (NEURONTIN) 600 MG tablet Take 600 mg by mouth 3 (three) times daily.     Marland Kitchen. HYDROcodone-acetaminophen (NORCO) 7.5-325 MG tablet Take 1 tablet by mouth every 6 (six) hours as needed for moderate pain. 42 tablet 0  . ibuprofen (ADVIL,MOTRIN) 800 MG tablet Take 800 mg by mouth 3 (three) times daily.     Marland Kitchen. levETIRAcetam (KEPPRA) 500 MG tablet Take 1,500 mg by mouth 3 (three) times daily.     Marland Kitchen. lisinopril (PRINIVIL) 20 MG tablet Take 1 tablet (20 mg total) by mouth daily. 30 tablet 0  . ondansetron (ZOFRAN ODT) 4 MG disintegrating tablet Take 1 tablet (4 mg total) by mouth every 8 (eight) hours as needed for nausea or vomiting. 20 tablet 0  . pioglitazone (ACTOS) 15 MG tablet Take 1 tablet by mouth daily.  11  . polyvinyl alcohol-povidone (REFRESH) 1.4-0.6 % ophthalmic solution Apply 1-2 drops to eye daily as needed (for dry eye relief).    Marland Kitchen. tiZANidine (ZANAFLEX) 4 MG tablet Take 4 mg by mouth 4 (four) times daily.    Marland Kitchen. zolpidem (AMBIEN) 5 MG tablet Take 5 mg by mouth at bedtime as needed. Sleep      Current Facility-Administered Medications  Medication Dose Route Frequency Provider Last Rate Last Dose  .  vancomycin (VANCOCIN) IVPB 1000 mg/200 mL premix  1,000 mg Intravenous Once Vickki HearingStanley E Modena Bellemare, MD           Physical Exam Height 5 feet weight 192 blood pressure 127/83 pulse 90 Physical Exam  Constitutional: She is oriented to person, place, and time. She appears well-developed and well-nourished. No distress.  Cardiovascular: Normal rate and intact distal pulses.   Neurological: She is alert and oriented to person, place, and time. She has normal reflexes. She exhibits normal muscle tone. Coordination normal.  Skin: Skin is warm and dry. No rash noted. She is not diaphoretic. No erythema. No pallor.  Psychiatric: She has a normal mood and affect. Her behavior is normal. Judgment and thought content normal.   Ambulatory status currently  nonambulatory except with assistive devices of crutches  Right foot Inspection very swollen right foot no blisters, tenderness over Lisfranc joint  Range of motion ankle range of motion remains intact no laxity is noted at Lisfranc joint  Stability tests ankle otherwise stable  Motor exam no atrophy   Neurovascular examination is intact  Lymph node palpation is normal  The opposite extremity exhibits normal range of motion stability and strength neurovascular exam is intact, lymph nodes are negative and there is no swelling or tenderness   Data Reviewed  independent image interpretation :  X-ray of the foot and ankle are reviewed. The ankle x-rays shows intact hardware medially and laterally with intact ankle mortise  Foot x-ray shows fracture second metatarsal at Lisfranc's joint  Will require further imaging  Assessment    Encounter Diagnosis  Name Primary?  . Lisfranc dislocation, right, initial encounter Yes       Plan    Recommend CT scan evaluate Lisfranc's joint  Cam Walker no weightbearing  Meds ordered this encounter  Medications  . HYDROcodone-acetaminophen (NORCO) 7.5-325 MG tablet    Sig: Take 1 tablet by mouth every 4 (four) hours as needed for moderate pain.    Dispense:  30 tablet    Refill:  0     Fuller CanadaStanley Tanieka Pownall, MD 10/15/2016 11:22 AM

## 2016-10-19 ENCOUNTER — Ambulatory Visit (HOSPITAL_COMMUNITY)
Admission: RE | Admit: 2016-10-19 | Discharge: 2016-10-19 | Disposition: A | Discharge status: Home / Self Care | Payer: Medicare Other | Source: Ambulatory Visit | Attending: Orthopedic Surgery | Admitting: Orthopedic Surgery

## 2016-10-19 DIAGNOSIS — R601 Generalized edema: Secondary | ICD-10-CM | POA: Insufficient documentation

## 2016-10-19 DIAGNOSIS — S92321A Displaced fracture of second metatarsal bone, right foot, initial encounter for closed fracture: Secondary | ICD-10-CM | POA: Diagnosis not present

## 2016-10-19 DIAGNOSIS — S92341A Displaced fracture of fourth metatarsal bone, right foot, initial encounter for closed fracture: Secondary | ICD-10-CM | POA: Diagnosis not present

## 2016-10-19 DIAGNOSIS — S93324A Dislocation of tarsometatarsal joint of right foot, initial encounter: Secondary | ICD-10-CM | POA: Diagnosis not present

## 2016-10-19 DIAGNOSIS — S92331A Displaced fracture of third metatarsal bone, right foot, initial encounter for closed fracture: Secondary | ICD-10-CM | POA: Insufficient documentation

## 2016-10-20 ENCOUNTER — Ambulatory Visit (INDEPENDENT_AMBULATORY_CARE_PROVIDER_SITE_OTHER): Payer: Medicare Other | Admitting: Orthopedic Surgery

## 2016-10-20 ENCOUNTER — Encounter: Payer: Self-pay | Admitting: Orthopedic Surgery

## 2016-10-20 DIAGNOSIS — S93324A Dislocation of tarsometatarsal joint of right foot, initial encounter: Secondary | ICD-10-CM

## 2016-10-20 MED ORDER — HYDROCODONE-ACETAMINOPHEN 7.5-325 MG PO TABS: 1.0000 | ORAL_TABLET | ORAL | 0 refills | Status: DC | PRN

## 2016-10-20 NOTE — Progress Notes (Signed)
Patient ID: Mariah Ibarra, female   DOB: 09/23/1971, 45 y.o.   MRN: [id number]  Chief Complaint  Patient presents with  . Follow-up    CT REVIEW, RT FOOT, Date of injury was December 5     HPI Mariah Ibarra is a 45 y.o. female.   HPI  45 year old female status post right foot midfoot injury on December 5. Suspicious for Lisfranc fracture dislocation  Patient sent for CAT scan comes in for follow-up  Review of Systems Review of Systems  Constitutional: Negative for fever.  Musculoskeletal: Positive for gait problem and joint swelling.  Neurological: Positive for seizures.    Physical Exam  Constitutional: She is oriented to person, place, and time. She appears well-developed and well-nourished. No distress.  Cardiovascular: Normal rate and intact distal pulses.   Neurological: She is alert and oriented to person, place, and time. She has normal reflexes. She exhibits normal muscle tone. Coordination normal.  Skin: Skin is warm and dry. No rash noted. She is not diaphoretic. No erythema. No pallor.  Psychiatric: She has a normal mood and affect. Her behavior is normal. Judgment and thought content normal.     Tenderness across the tarsometatarsal joint all 5 digits. Mild swelling. Neurovascular exam intact.   MEDICAL DECISION MAKING  DATA   CT scan plus report  Multiple fractures are noted in the foot on CAT scan including second third and fourth metatarsals.  IMPRESSION: 1. Various fractures along the Lisfranc joint include the bases of the second metatarsal, the third metatarsal, the fourth metatarsal, and portions of the medial, middle, and lateral cuneiform spur. Although there is no current malalignment, this joint is probably unstable and there clearly fracture fragments along the Lisfranc ligament. 2. Subcutaneous edema along the dorsum of the foot and track along both malleoli.   Electronically Signed   By: Gaylyn RongWalter  Liebkemann M.D.   On: 10/19/2016  19:29   DIAGNOSIS  Encounter Diagnosis  Name Primary?  . Lisfranc dislocation, right, initial encounter Yes     PLAN(RISK)    I will review the patient nonweightbearing  We'll make a referral for a consultation with foot and ankle specialist for Lisfranc fracture dislocation of the right foot

## 2016-10-20 NOTE — Addendum Note (Signed)
Addended by: Adella HareBOOTHE, JAIME B on: 10/20/2016 12:05 PM   Modules accepted: Orders

## 2016-11-02 ENCOUNTER — Encounter (HOSPITAL_COMMUNITY): Payer: Self-pay | Admitting: Adult Health

## 2016-11-02 ENCOUNTER — Emergency Department (HOSPITAL_COMMUNITY)
Admission: EM | Admit: 2016-11-02 | Discharge: 2016-11-03 | Disposition: A | Discharge status: Home / Self Care | Payer: Medicare Other | Attending: Emergency Medicine | Admitting: Emergency Medicine

## 2016-11-02 ENCOUNTER — Emergency Department (HOSPITAL_COMMUNITY): Payer: Medicare Other

## 2016-11-02 DIAGNOSIS — Y929 Unspecified place or not applicable: Secondary | ICD-10-CM | POA: Diagnosis not present

## 2016-11-02 DIAGNOSIS — Y999 Unspecified external cause status: Secondary | ICD-10-CM | POA: Diagnosis not present

## 2016-11-02 DIAGNOSIS — S0003XA Contusion of scalp, initial encounter: Secondary | ICD-10-CM | POA: Insufficient documentation

## 2016-11-02 DIAGNOSIS — Z79899 Other long term (current) drug therapy: Secondary | ICD-10-CM | POA: Insufficient documentation

## 2016-11-02 DIAGNOSIS — R569 Unspecified convulsions: Secondary | ICD-10-CM

## 2016-11-02 DIAGNOSIS — G40909 Epilepsy, unspecified, not intractable, without status epilepticus: Secondary | ICD-10-CM | POA: Insufficient documentation

## 2016-11-02 DIAGNOSIS — I10 Essential (primary) hypertension: Secondary | ICD-10-CM | POA: Insufficient documentation

## 2016-11-02 DIAGNOSIS — S0993XA Unspecified injury of face, initial encounter: Secondary | ICD-10-CM | POA: Diagnosis not present

## 2016-11-02 DIAGNOSIS — S0083XA Contusion of other part of head, initial encounter: Secondary | ICD-10-CM | POA: Diagnosis not present

## 2016-11-02 DIAGNOSIS — Y939 Activity, unspecified: Secondary | ICD-10-CM | POA: Insufficient documentation

## 2016-11-02 DIAGNOSIS — E876 Hypokalemia: Secondary | ICD-10-CM | POA: Insufficient documentation

## 2016-11-02 DIAGNOSIS — W1800XA Striking against unspecified object with subsequent fall, initial encounter: Secondary | ICD-10-CM | POA: Diagnosis not present

## 2016-11-02 DIAGNOSIS — I482 Chronic atrial fibrillation: Secondary | ICD-10-CM | POA: Diagnosis not present

## 2016-11-02 DIAGNOSIS — G4489 Other headache syndrome: Secondary | ICD-10-CM | POA: Diagnosis not present

## 2016-11-02 LAB — BASIC METABOLIC PANEL
Anion gap: 17 — ABNORMAL HIGH (ref 5–15)
BUN: 8 mg/dL (ref 6–20)
CALCIUM: 9 mg/dL (ref 8.9–10.3)
CHLORIDE: 102 mmol/L (ref 101–111)
CO2: 16 mmol/L — ABNORMAL LOW (ref 22–32)
CREATININE: 1.03 mg/dL — AB (ref 0.44–1.00)
GFR calc non Af Amer: >60 mL/min (ref >60)
Glucose, Bld: 97 mg/dL (ref 65–99)
Potassium: 3 mmol/L — ABNORMAL LOW (ref 3.5–5.1)
SODIUM: 135 mmol/L (ref 135–145)

## 2016-11-02 LAB — CBC WITH DIFFERENTIAL/PLATELET
BASOS PCT: 0 %
Basophils Absolute: 0 10*3/uL (ref 0.0–0.1)
EOS ABS: 0 10*3/uL (ref 0.0–0.7)
EOS PCT: 0 %
HCT: 41.2 % (ref 36.0–46.0)
Hemoglobin: 13.5 g/dL (ref 12.0–15.0)
LYMPHS ABS: 1.6 10*3/uL (ref 0.7–4.0)
Lymphocytes Relative: 19 %
MCH: 29.2 pg (ref 26.0–34.0)
MCHC: 32.8 g/dL (ref 30.0–36.0)
MCV: 89.2 fL (ref 78.0–100.0)
MONOS PCT: 6 %
Monocytes Absolute: 0.5 10*3/uL (ref 0.1–1.0)
NEUTROS PCT: 75 %
Neutro Abs: 6.5 10*3/uL (ref 1.7–7.7)
PLATELETS: 307 10*3/uL (ref 150–400)
RBC: 4.62 MIL/uL (ref 3.87–5.11)
RDW: 14.6 % (ref 11.5–15.5)
WBC: 8.7 10*3/uL (ref 4.0–10.5)

## 2016-11-02 LAB — CBG MONITORING, ED: Glucose-Capillary: 94 mg/dL (ref 65–99)

## 2016-11-02 MED ORDER — HYDROMORPHONE HCL 2 MG PO TABS
2.0000 mg | ORAL_TABLET | Freq: Once | ORAL | Status: AC
  Administered 2016-11-02: 2 mg via ORAL
  Filled 2016-11-02: qty 1

## 2016-11-02 MED ORDER — POTASSIUM CHLORIDE CRYS ER 20 MEQ PO TBCR
40.0000 meq | EXTENDED_RELEASE_TABLET | Freq: Once | ORAL | Status: AC
  Administered 2016-11-02: 40 meq via ORAL
  Filled 2016-11-02: qty 2

## 2016-11-02 MED ORDER — SODIUM CHLORIDE 0.9 % IV SOLN
1000.0000 mL | Freq: Once | INTRAVENOUS | Status: AC
  Administered 2016-11-02: 1000 mL via INTRAVENOUS

## 2016-11-02 MED ORDER — HYDROCODONE-ACETAMINOPHEN 5-325 MG PO TABS: 2.0000 | ORAL_TABLET | Freq: Once | ORAL | Status: DC

## 2016-11-02 MED ORDER — SODIUM CHLORIDE 0.9 % IV SOLN
1000.0000 mL | INTRAVENOUS | Status: DC
  Administered 2016-11-02: 1000 mL via INTRAVENOUS

## 2016-11-02 MED ORDER — PROMETHAZINE HCL 12.5 MG PO TABS
12.5000 mg | ORAL_TABLET | Freq: Once | ORAL | Status: AC
  Administered 2016-11-02: 12.5 mg via ORAL
  Filled 2016-11-02: qty 1

## 2016-11-02 MED ORDER — MIDAZOLAM HCL 2 MG/2ML IJ SOLN
1.0000 mg | Freq: Once | INTRAMUSCULAR | Status: AC
  Administered 2016-11-02: 1 mg via INTRAVENOUS
  Filled 2016-11-02: qty 2

## 2016-11-02 NOTE — ED Provider Notes (Signed)
[facility] Provider Note   CSN: [id number] Arrival date & time: 11/02/16  2012     History   Chief Complaint Chief Complaint  Patient presents with  . Seizures    HPI Mariah Ibarra is a 46 y.o. female.  Patient is a 46 year old female who presents to the emergency department by EMS with a complaint of facial pain following seizure and fall.  The patient states that prior to coming to the emergency department she had a seizure. She fell and struck her face on a table or chair as she was falling. She denies loss of consciousness. She states that she has a history of seizure activity, and has frequent breakthrough seizures. She is currently on Keppra. She takes her Keppra once daily, and she had her dose on yesterday. She states that she has not been missing doses of her medication. She complains of pain of the left face and jaw. She states that as a result of the fall her "entire facial structure on the left have been removed". She states that she does not look now like she did when she got up this morning. She further states she does not remember how long her seizure lasted, but EMS personnel with total that the seizure lasted about a minute. She denies other injury at this time. She denies being on any anticoagulation medications. She has no history of bleeding disorders. She is unsure of any triggers that may have brought the seizure on. It is also of note that the patient has a fracture of the right lower extremity. This is being followed by orthopedics.   The history is provided by the patient.  Seizures   Associated symptoms include diarrhea. Pertinent negatives include no confusion, no speech difficulty, no chest pain and no cough.    Past Medical History:  Diagnosis Date  . Anxiety   . Hypercholesteremia   . Hypertension   . Panic attacks   . PONV (postoperative nausea and vomiting)   . PTSD (post-traumatic stress disorder)   . Seizures (HCC)    2 months since  last seizure.  . Stroke [facility])    short term memory loss from 3rd brain surgery.  . Trigeminal neuralgia     Patient Active Problem List   Diagnosis Date Noted  . Fracture of ankle, trimalleolar, right, closed   . Thrush 12/03/2012  . Otitis externa, left 12/03/2012  . SIADH (syndrome of inappropriate ADH production) (HCC) 12/02/2012  . Hyponatremia 12/01/2012  . Seizure (HCC) 12/01/2012  . High anion gap metabolic acidosis 12/01/2012  . Hypokalemia 12/01/2012  . Depression 12/01/2012  . Anxiety 12/01/2012  . Ataxia 08/24/2012  . Trigeminal neuralgia 08/24/2012  . Hypertension 08/24/2012  . Hyperlipidemia 08/24/2012    Past Surgical History:  Procedure Laterality Date  . ABDOMINAL HYSTERECTOMY    . ABDOMINOPLASTY    . APPENDECTOMY    . bladder tack    . BRAIN SURGERY     x4  . CERVICAL ABLATION    . Gamma knife Procedure November 2013    . ORIF ANKLE FRACTURE Right 06/03/2016   Procedure: OPEN REDUCTION INTERNAL FIXATION (ORIF) RIGHT ANKLE;  Surgeon: Vickki Hearing, MD;  Location: [facility];  Service: Orthopedics;  Laterality: Right;  . TONSILLECTOMY      OB History    Gravida Para Term Preterm AB Living   0 0 0 0 0     SAB TAB Ectopic Multiple Live Births   0 0 0  Home Medications    Prior to Admission medications   Medication Sig Start Date End Date Taking? Authorizing Provider  bismuth subsalicylate (PEPTO BISMOL) 262 MG chewable tablet Chew 524 mg by mouth as needed for diarrhea or loose stools.   Yes Historical Provider, MD  citalopram (CELEXA) 40 MG tablet Take 40 mg by mouth daily.   Yes Historical Provider, MD  diphenhydramine-acetaminophen (TYLENOL PM) 25-500 MG TABS tablet Take 1 tablet by mouth at bedtime as needed (for rest).   Yes Historical Provider, MD  gabapentin (NEURONTIN) 600 MG tablet Take 600 mg by mouth 3 (three) times daily.    Yes Historical Provider, MD  HYDROcodone-acetaminophen (NORCO) 7.5-325 MG tablet Take 1 tablet by mouth  every 4 (four) hours as needed for moderate pain. 10/20/16  Yes Vickki HearingStanley E Harrison, MD  Ibuprofen-Diphenhydramine Cit (ADVIL PM) 200-38 MG TABS Take 1-2 tablets by mouth daily as needed (for rest).   Yes Historical Provider, MD  lamoTRIgine (LAMICTAL) 25 MG tablet Take 100 mg by mouth 2 (two) times daily.   Yes Historical Provider, MD  mirtazapine (REMERON) 30 MG tablet Take 30 mg by mouth at bedtime.   Yes Historical Provider, MD  ondansetron (ZOFRAN ODT) 4 MG disintegrating tablet Take 1 tablet (4 mg total) by mouth every 8 (eight) hours as needed for nausea or vomiting. Patient taking differently: Take 4 mg by mouth every 6 (six) hours as needed for nausea or vomiting.  12/29/13  Yes Gerhard Munchobert Lockwood, MD  tiZANidine (ZANAFLEX) 4 MG tablet Take 8 mg by mouth 3 (three) times daily.    Yes Historical Provider, MD  cholecalciferol (GNP VITAMIN D) 400 UNITS TABS Take 1 tablet by mouth Daily.    Historical Provider, MD  ibuprofen (ADVIL,MOTRIN) 800 MG tablet Take 800 mg by mouth 3 (three) times daily.     Historical Provider, MD  levETIRAcetam (KEPPRA) 500 MG tablet Take 1,500 mg by mouth 3 (three) times daily.  05/20/16   Historical Provider, MD  lidocaine (XYLOCAINE) 2 % jelly Apply topically.    Historical Provider, MD  lisinopril (PRINIVIL) 20 MG tablet Take 1 tablet (20 mg total) by mouth daily. 12/03/12   Christiane Haorinna L Sullivan, MD  pioglitazone (ACTOS) 15 MG tablet Take 1 tablet by mouth daily. 03/23/16   Historical Provider, MD  polyvinyl alcohol-povidone (REFRESH) 1.4-0.6 % ophthalmic solution Apply 1-2 drops to eye daily as needed (for dry eye relief).    Historical Provider, MD  zolpidem (AMBIEN) 5 MG tablet Take 5 mg by mouth at bedtime as needed. Sleep     Historical Provider, MD    Family History Family History  Problem Relation Age of Onset  . Depression Paternal Aunt   . Depression Paternal Uncle   . Depression Maternal Grandfather     Social History Social History  Substance Use Topics   . Smoking status: Never Smoker  . Smokeless tobacco: Never Used  . Alcohol use No     Comment: occ     Allergies   Baclofen and Lorazepam   Review of Systems Review of Systems  Constitutional: Negative for activity change.       All ROS Neg except as noted in HPI  HENT: Negative for nosebleeds.        Facial pain  Eyes: Negative for photophobia and discharge.  Respiratory: Negative for cough, shortness of breath and wheezing.   Cardiovascular: Negative for chest pain and palpitations.  Gastrointestinal: Positive for diarrhea. Negative for abdominal pain and blood in  stool.  Genitourinary: Negative for dysuria, frequency and hematuria.  Musculoskeletal: Positive for arthralgias. Negative for back pain and neck pain.  Skin: Negative.   Neurological: Positive for seizures. Negative for dizziness and speech difficulty.  Psychiatric/Behavioral: Negative for confusion and hallucinations. The patient is nervous/anxious.      Physical Exam Updated Vital Signs BP 127/96   Pulse 105   Temp 98.2 F (36.8 C) (Oral)   Resp 18   Ht 5\' 2"  (1.575 m)   Wt 81.6 kg   SpO2 99%   BMI 32.92 kg/m   Physical Exam  Constitutional: She is oriented to person, place, and time. She appears well-developed and well-nourished.  Non-toxic appearance.  HENT:  Head: Normocephalic.  Right Ear: Tympanic membrane and external ear normal.  Left Ear: Tympanic membrane and external ear normal.  There is tenderness to even mild touch of the left face. There is some crepitus at the TMJ area. There is no palpable deformity of the mandible. There are no chipped teeth. There are no loose teeth appreciated. The patient has pain to palpation of the inner aspect of the lower jaw behind the molars. There is no palpable hematoma appreciated.  There is no noted deformity of the nose. There is no nosebleed. There is a negative Battle's sign.  Eyes: EOM and lids are normal. Pupils are equal, round, and reactive to  light.  Neck: Normal range of motion. Neck supple. Carotid bruit is not present.  Cardiovascular: Normal rate, regular rhythm, normal heart sounds, intact distal pulses and normal pulses.   Pulmonary/Chest: Breath sounds normal. No respiratory distress.  Abdominal: Soft. Bowel sounds are normal. There is no tenderness. There is no guarding.  Musculoskeletal: Normal range of motion.  There is pain to palpation and movement of the right lower leg. Patient is in a cast.  Lymphadenopathy:       Head (right side): No submandibular adenopathy present.       Head (left side): No submandibular adenopathy present.    She has no cervical adenopathy.  Neurological: She is alert and oriented to person, place, and time. She has normal strength. No cranial nerve deficit or sensory deficit.  Skin: Skin is warm and dry.  Psychiatric: She has a normal mood and affect. Her speech is normal.  Nursing note and vitals reviewed.    ED Treatments / Results  Labs (all labs ordered are listed, but only abnormal results are displayed) Labs Reviewed  BASIC METABOLIC PANEL - Abnormal; Notable for the following:       Result Value   Potassium 3.0 (*)    CO2 16 (*)    Creatinine, Ser 1.03 (*)    Anion gap 17 (*)    All other components within normal limits  CBC WITH DIFFERENTIAL/PLATELET  CBG MONITORING, ED    EKG  EKG Interpretation None       Radiology Ct Head Wo Contrast  Result Date: 11/02/2016 CLINICAL DATA:  Larey Seat during a seizure, striking face on the corner of a table. EXAM: CT HEAD WITHOUT CONTRAST CT MAXILLOFACIAL WITHOUT CONTRAST TECHNIQUE: Multidetector CT imaging of the head and maxillofacial structures were performed using the standard protocol without intravenous contrast. Multiplanar CT image reconstructions of the maxillofacial structures were also generated. COMPARISON:  01/01/2016 FINDINGS: CT HEAD FINDINGS Brain: There is no intracranial hemorrhage, mass or evidence of acute  infarction. There is no extra-axial fluid collection. Gray matter and white matter appear normal. Cerebral volume is normal for age. Brainstem and posterior  fossa are unremarkable. The CSF spaces appear normal. Vascular: No hyperdense vessel or unexpected calcification. Skull: Normal. Negative for fracture or focal lesion. Other: None. CT MAXILLOFACIAL FINDINGS Osseous: No fracture or mandibular dislocation. No destructive process. Orbits: Negative. No traumatic or inflammatory finding. Sinuses: Clear. Soft tissues: Negative. IMPRESSION: 1. Normal brain 2. Negative for acute maxillofacial fracture. Electronically Signed   By: Ellery Plunk M.D.   On: 11/02/2016 22:06   Ct Maxillofacial Wo Contrast  Result Date: 11/02/2016 CLINICAL DATA:  Larey Seat during a seizure, striking face on the corner of a table. EXAM: CT HEAD WITHOUT CONTRAST CT MAXILLOFACIAL WITHOUT CONTRAST TECHNIQUE: Multidetector CT imaging of the head and maxillofacial structures were performed using the standard protocol without intravenous contrast. Multiplanar CT image reconstructions of the maxillofacial structures were also generated. COMPARISON:  01/01/2016 FINDINGS: CT HEAD FINDINGS Brain: There is no intracranial hemorrhage, mass or evidence of acute infarction. There is no extra-axial fluid collection. Gray matter and white matter appear normal. Cerebral volume is normal for age. Brainstem and posterior fossa are unremarkable. The CSF spaces appear normal. Vascular: No hyperdense vessel or unexpected calcification. Skull: Normal. Negative for fracture or focal lesion. Other: None. CT MAXILLOFACIAL FINDINGS Osseous: No fracture or mandibular dislocation. No destructive process. Orbits: Negative. No traumatic or inflammatory finding. Sinuses: Clear. Soft tissues: Negative. IMPRESSION: 1. Normal brain 2. Negative for acute maxillofacial fracture. Electronically Signed   By: Ellery Plunk M.D.   On: 11/02/2016 22:06     Procedures Procedures (including critical care time)  Medications Ordered in ED Medications  0.9 %  sodium chloride infusion (not administered)    Followed by  0.9 %  sodium chloride infusion (not administered)  potassium chloride SA (K-DUR,KLOR-CON) CR tablet 40 mEq (not administered)  promethazine (PHENERGAN) tablet 12.5 mg (not administered)  HYDROmorphone (DILAUDID) tablet 2 mg (not administered)  midazolam (VERSED) injection 1 mg (1 mg Intravenous Given 11/02/16 2042)     Initial Impression / Assessment and Plan / ED Course  I have reviewed the triage vital signs and the nursing notes.  Pertinent labs & imaging results that were available during my care of the patient were reviewed by me and considered in my medical decision making (see chart for details).  Clinical Course    Case discussed and reviewed with Dr Ranae Palms. IV fluids ordered. Potassium ordered. Pain medication ordered.  **I have reviewed nursing notes, vital signs, and all appropriate lab and imaging results for this patient.*  Final Clinical Impressions(s) / ED Diagnoses  Heart rate is slightly elevated at 105. The remainder of the vital signs are within normal limits. The CT scan of the head is normal. The CT scan of the facial bones is negative for fracture or dislocation or abscess or gas or any other abnormality. The potassium is low at 3.0. Patient is given oral potassium. The CO2 is low at 16. The patient will be given IV fluids and reassessed.  I discussed the findings with the patient in terms which he understands. Chaperone was in the room during the discussion. The patient states that we are missing something, because her face is out of place. She mentions several things that are wrong in terms of her nose and her jaw. I attempted to answer questions. The patient is convinced however that there is something that we are missing. She also states that she possibly has some infected teeth that could be  affecting her jaw and face, and demanded that a CBC be done. I explained  to the patient that no abscess was seen on the CT scans, and no gas was noted in the areas where she is complaining of problem. At her request I have ordered a complete blood count count. The patient will receive IV fluids, oral potassium, and medication for her pain and discomfort.  Complete blood count is well within normal limits. I discussed the findings with the patient in terms which he understands.  The patient seems to be feeling some better after pain medication. Patient again saying that her face is caving in. I suggested to the patient that she see an oral surgeon concerning her temporomandibular joint, and we also discussed [facility] if she is running into obstacles seeing one of the private oral surgeons in this area. I've asked the patient see her primary physician concerning her hypokalemia, as well as her breakthrough seizures. Patient is to return to the emergency department if any emergent changes, problems, or concerns.    Final diagnoses:  Seizure (HCC)  Contusion of face, initial encounter  Contusion of scalp, initial encounter  Hypokalemia    New Prescriptions Discharge Medication List as of 11/03/2016 12:35 AM       Ivery Quale, PA-C 11/04/16 0044    Loren Racer, MD 11/05/16 1027

## 2016-11-02 NOTE — ED Triage Notes (Signed)
PResents post multiple seizures today-taking Keppra as directed-HX of Diarrhea x 5 days. During a seizure pt fell and hit face on the corner of a table- she is able to open and close with no problem . ALert and oriented at this time. LAst seizure at 1944 this evening lasting approx 1 minute

## 2016-11-02 NOTE — ED Notes (Signed)
Pt requesting pain medication "My jaw is caved in on self. Can't you see my teeth and my face. My skull is caving in" [place], [place]

## 2016-11-02 NOTE — ED Notes (Signed)
Pt updated that we are waiting on CT scan, pt requesting pain medication. PER PA, awaiting results of CT scan.

## 2016-11-02 NOTE — ED Notes (Signed)
Pt requesting pain medication, Per PA, waiting on CT scans

## 2016-11-03 NOTE — Discharge Instructions (Signed)
Your CT scans of the head and all facial bones are within normal limits. Your potassium as low. Please increase foods high in potassium. Please see your primary MD for referral to oral surgeon concerning your face and jaw.

## 2016-11-04 ENCOUNTER — Ambulatory Visit (INDEPENDENT_AMBULATORY_CARE_PROVIDER_SITE_OTHER): Payer: Medicare Other | Admitting: Orthopaedic Surgery

## 2016-11-04 ENCOUNTER — Encounter (INDEPENDENT_AMBULATORY_CARE_PROVIDER_SITE_OTHER): Payer: Self-pay | Admitting: Orthopaedic Surgery

## 2016-11-04 ENCOUNTER — Emergency Department (HOSPITAL_COMMUNITY)
Admission: EM | Admit: 2016-11-04 | Discharge: 2016-11-05 | Disposition: A | Discharge status: Home / Self Care | Payer: Medicare Other | Attending: Emergency Medicine | Admitting: Emergency Medicine

## 2016-11-04 ENCOUNTER — Emergency Department (HOSPITAL_COMMUNITY): Payer: Medicare Other

## 2016-11-04 ENCOUNTER — Encounter (HOSPITAL_COMMUNITY): Payer: Self-pay | Admitting: *Deleted

## 2016-11-04 ENCOUNTER — Ambulatory Visit (INDEPENDENT_AMBULATORY_CARE_PROVIDER_SITE_OTHER): Payer: Medicare Other

## 2016-11-04 DIAGNOSIS — R1033 Periumbilical pain: Secondary | ICD-10-CM | POA: Insufficient documentation

## 2016-11-04 DIAGNOSIS — I10 Essential (primary) hypertension: Secondary | ICD-10-CM | POA: Insufficient documentation

## 2016-11-04 DIAGNOSIS — R000 Tachycardia, unspecified: Secondary | ICD-10-CM | POA: Insufficient documentation

## 2016-11-04 DIAGNOSIS — S93324A Dislocation of tarsometatarsal joint of right foot, initial encounter: Secondary | ICD-10-CM | POA: Diagnosis not present

## 2016-11-04 DIAGNOSIS — R112 Nausea with vomiting, unspecified: Secondary | ICD-10-CM

## 2016-11-04 DIAGNOSIS — R197 Diarrhea, unspecified: Secondary | ICD-10-CM | POA: Diagnosis not present

## 2016-11-04 DIAGNOSIS — R1013 Epigastric pain: Secondary | ICD-10-CM | POA: Insufficient documentation

## 2016-11-04 DIAGNOSIS — R109 Unspecified abdominal pain: Secondary | ICD-10-CM

## 2016-11-04 DIAGNOSIS — R1084 Generalized abdominal pain: Secondary | ICD-10-CM | POA: Diagnosis not present

## 2016-11-04 DIAGNOSIS — Z79899 Other long term (current) drug therapy: Secondary | ICD-10-CM | POA: Insufficient documentation

## 2016-11-04 LAB — CBC
HCT: 45.1 % (ref 36.0–46.0)
Hemoglobin: 14.9 g/dL (ref 12.0–15.0)
MCH: 30 pg (ref 26.0–34.0)
MCHC: 33 g/dL (ref 30.0–36.0)
MCV: 90.7 fL (ref 78.0–100.0)
PLATELETS: 475 10*3/uL — AB (ref 150–400)
RBC: 4.97 MIL/uL (ref 3.87–5.11)
RDW: 15.3 % (ref 11.5–15.5)
WBC: 13.2 10*3/uL — ABNORMAL HIGH (ref 4.0–10.5)

## 2016-11-04 MED ORDER — SODIUM CHLORIDE 0.9 % IV BOLUS (SEPSIS)
1000.0000 mL | Freq: Once | INTRAVENOUS | Status: AC
  Administered 2016-11-05: 1000 mL via INTRAVENOUS

## 2016-11-04 MED ORDER — IOPAMIDOL (ISOVUE-300) INJECTION 61%
100.0000 mL | Freq: Once | INTRAVENOUS | Status: AC | PRN
  Administered 2016-11-05: 100 mL via INTRAVENOUS

## 2016-11-04 MED ORDER — ONDANSETRON HCL 4 MG/2ML IJ SOLN
4.0000 mg | Freq: Once | INTRAMUSCULAR | Status: AC
  Administered 2016-11-05: 4 mg via INTRAVENOUS
  Filled 2016-11-04: qty 2

## 2016-11-04 NOTE — Progress Notes (Signed)
Office Visit Note   Patient: Mariah Ibarra           Date of Birth: 05/26/1971           MRN: [id number] Visit Date: 11/04/2016              Requested by: Frederich Chick., MD [place], [place] [address] PCP: Jayme Cloud., Gerrit Friends, MD   Assessment & Plan: Visit Diagnoses:  1. Lisfranc dislocation, right, initial encounter     Plan: Weightbearing x-rays show stable findings of the fractures. I think at this point we can treat this nonoperatively. She can continue with protected weightbearing in the cam boot. I'll like to see her back in 3 weeks with repeat 3 view weightbearing x-rays of the right foot.    Follow-Up Instructions: Return in about 3 weeks (around 11/25/2016) for recheck right lisfranc.   Orders:  Orders Placed This Encounter  Procedures  . XR Foot Complete Right   No orders of the defined types were placed in this encounter.     Procedures: No procedures performed   Clinical Data: No additional findings.   Subjective: No chief complaint on file.   Patient is a 46 year old female who [place]. She has been ambulating with a cam boot since then and she still has pain and difficulty sleeping. She has been referred by Dr. Romeo Apple for further evaluation and treatment. She is ambulating with an Mining engineer wheelchair. She has been doing exercises at home.  The pain is mild and is worse with weightbearing and touch and better with elevation and ice. Pain does not radiate.    Review of Systems Complete review of systems is negative except for history of present illness  Objective: Vital Signs: There were no vitals taken for this visit.  Physical Exam Well-developed nourished acute distress alert and 3, breathing normal judgments affect abdomen soft no lymphadenopathy Ortho Exam Exam of the right foot shows no significant swelling or bruising. She does have mild tenderness to palpation at the base of the  second third and fourth metatarsals. The foot is warm well-perfused. Specialty Comments:  No specialty comments available.  Imaging: Xr Foot Complete Right  Result Date: 11/04/2016 Stable fractures and lisfranc complex with weight bearing    PMFS History: Patient Active Problem List   Diagnosis Date Noted  . Lisfranc dislocation, right, initial encounter 11/04/2016  . Fracture of ankle, trimalleolar, right, closed   . Thrush 12/03/2012  . Otitis externa, left 12/03/2012  . SIADH (syndrome of inappropriate ADH production) (HCC) 12/02/2012  . Hyponatremia 12/01/2012  . Seizure (HCC) 12/01/2012  . High anion gap metabolic acidosis 12/01/2012  . Hypokalemia 12/01/2012  . Depression 12/01/2012  . Anxiety 12/01/2012  . Ataxia 08/24/2012  . Trigeminal neuralgia 08/24/2012  . Hypertension 08/24/2012  . Hyperlipidemia 08/24/2012   Past Medical History:  Diagnosis Date  . Anxiety   . Hypercholesteremia   . Hypertension   . Panic attacks   . PONV (postoperative nausea and vomiting)   . PTSD (post-traumatic stress disorder)   . Seizures (HCC)    2 months since last seizure.  . Stroke [facility])    short term memory loss from 3rd brain surgery.  . Trigeminal neuralgia     Family History  Problem Relation Age of Onset  . Depression Paternal Aunt   . Depression Paternal Uncle   . Depression Maternal Grandfather     Past Surgical History:  Procedure  Laterality Date  . ABDOMINAL HYSTERECTOMY    . ABDOMINOPLASTY    . APPENDECTOMY    . bladder tack    . BRAIN SURGERY     x4  . CERVICAL ABLATION    . Gamma knife Procedure November 2013    . ORIF ANKLE FRACTURE Right 06/03/2016   Procedure: OPEN REDUCTION INTERNAL FIXATION (ORIF) RIGHT ANKLE;  Surgeon: Vickki HearingStanley E Harrison, MD;  Location: [facility];  Service: Orthopedics;  Laterality: Right;  . TONSILLECTOMY     Social History   Occupational History  . Not on file.   Social History Main Topics  . Smoking status: Never Smoker  .  Smokeless tobacco: Never Used  . Alcohol use No     Comment: occ  . Drug use: No  . Sexual activity: Yes    Birth control/ protection: None, Surgical

## 2016-11-04 NOTE — ED Triage Notes (Signed)
Onset of vomiting and diarrhea about 2 weeks ago associated with generalized abdominal pain. Pt used phenergan suppositories tonight for the same

## 2016-11-04 NOTE — ED Provider Notes (Signed)
[facility]-EMERG[facility]Y DEPT Provider Note   CSN: [id number] Arrival date & time: 11/04/16  2313  By signing my name below, I, Linna Darner, attest that this documentation has been prepared under the direction and in the presence of physician practitioner, Glynn Octave, MD. Electronically Signed: Linna Darner, Scribe. 11/04/2016. 11:33 PM.  History   Chief Complaint Chief Complaint  Patient presents with  . Emesis    The history is provided by the patient. No language interpreter was used.     HPI Comments: Mariah Ibarra is a 46 y.o. female who presents to the Emergency Department complaining of persistent nausea, vomiting, and diarrhea for two weeks. She states she has been able to retain fluids but has been regurgitating almost everything she has eaten for about 10 days. She also notes she has been regurgitating her regular medications occasionally; she has pain medication at home for a recent right foot fracture as well as her regular seizure medication. She states she has been having ~ 3 episodes of diarrhea daily since onset. She last vomited this afternoon, but took phenergan suppository at home with good improvement of her nausea and vomiting. She reports some constant periumbilical abdominal pain over the last couple of days that is worse with applied pressure around her umbilicus. No h/o the same severity or persistence of these symptoms in the past. She notes a pertinent PSHx of appendectomy, abdominal hysterectomy, and a partial oophorectomy. No h/o endoscopy. No h/o abdominal ulcers. She notes her sister has had some similar symptoms recently. Pt was seen here 2 days ago for a seizure and denies having any seizures since then. No recent foreign travel. No hormone therapy. No h/o diabetes. No recent antibiotics. She denies hematuria, dysuria, difficulty urinating, chest pain, SOB, hematochezia, hematemesis, or any other associated symptoms.  Past Medical History:  Diagnosis Date  .  Anxiety   . Hypercholesteremia   . Hypertension   . Panic attacks   . PONV (postoperative nausea and vomiting)   . PTSD (post-traumatic stress disorder)   . Seizures (HCC)    2 months since last seizure.  . Stroke [facility])    short term memory loss from 3rd brain surgery.  . Trigeminal neuralgia     Patient Active Problem List   Diagnosis Date Noted  . Lisfranc dislocation, right, initial encounter 11/04/2016  . Fracture of ankle, trimalleolar, right, closed   . Thrush 12/03/2012  . Otitis externa, left 12/03/2012  . SIADH (syndrome of inappropriate ADH production) (HCC) 12/02/2012  . Hyponatremia 12/01/2012  . Seizure (HCC) 12/01/2012  . High anion gap metabolic acidosis 12/01/2012  . Hypokalemia 12/01/2012  . Depression 12/01/2012  . Anxiety 12/01/2012  . Ataxia 08/24/2012  . Trigeminal neuralgia 08/24/2012  . Hypertension 08/24/2012  . Hyperlipidemia 08/24/2012    Past Surgical History:  Procedure Laterality Date  . ABDOMINAL HYSTERECTOMY    . ABDOMINOPLASTY    . APPENDECTOMY    . bladder tack    . BRAIN SURGERY     x4  . CERVICAL ABLATION    . Gamma knife Procedure November 2013    . ORIF ANKLE FRACTURE Right 06/03/2016   Procedure: OPEN REDUCTION INTERNAL FIXATION (ORIF) RIGHT ANKLE;  Surgeon: Vickki Hearing, MD;  Location: [facility] ORS;  Service: Orthopedics;  Laterality: Right;  . TONSILLECTOMY      OB History    Gravida Para Term Preterm AB Living   0 0 0 0 0     SAB TAB Ectopic Multiple Live Births  0 0 0           Home Medications    Prior to Admission medications   Medication Sig Start Date End Date Taking? Authorizing Provider  bismuth subsalicylate (PEPTO BISMOL) 262 MG chewable tablet Chew 524 mg by mouth as needed for diarrhea or loose stools.    Historical Provider, MD  cholecalciferol (GNP VITAMIN D) 400 UNITS TABS Take 1 tablet by mouth Daily.    Historical Provider, MD  citalopram (CELEXA) 40 MG tablet Take 40 mg by mouth daily.    Historical  Provider, MD  diphenhydramine-acetaminophen (TYLENOL PM) 25-500 MG TABS tablet Take 1 tablet by mouth at bedtime as needed (for rest).    Historical Provider, MD  gabapentin (NEURONTIN) 600 MG tablet Take 600 mg by mouth 3 (three) times daily.     Historical Provider, MD  HYDROcodone-acetaminophen (NORCO) 7.5-325 MG tablet Take 1 tablet by mouth every 4 (four) hours as needed for moderate pain. 10/20/16   Vickki Hearing, MD  ibuprofen (ADVIL,MOTRIN) 800 MG tablet Take 800 mg by mouth 3 (three) times daily.     Historical Provider, MD  Ibuprofen-Diphenhydramine Cit (ADVIL PM) 200-38 MG TABS Take 1-2 tablets by mouth daily as needed (for rest).    Historical Provider, MD  lamoTRIgine (LAMICTAL) 25 MG tablet Take 100 mg by mouth 2 (two) times daily.    Historical Provider, MD  levETIRAcetam (KEPPRA) 500 MG tablet Take 1,500 mg by mouth 3 (three) times daily.  05/20/16   Historical Provider, MD  lidocaine (XYLOCAINE) 2 % jelly Apply 1 application topically as needed.     Historical Provider, MD  lisinopril (PRINIVIL) 20 MG tablet Take 1 tablet (20 mg total) by mouth daily. 12/03/12   Christiane Ha, MD  mirtazapine (REMERON) 30 MG tablet Take 30 mg by mouth at bedtime.    Historical Provider, MD  ondansetron (ZOFRAN ODT) 4 MG disintegrating tablet Take 1 tablet (4 mg total) by mouth every 8 (eight) hours as needed for nausea or vomiting. Patient taking differently: Take 4 mg by mouth every 6 (six) hours as needed for nausea or vomiting.  12/29/13   Gerhard Munch, MD  pioglitazone (ACTOS) 15 MG tablet Take 1 tablet by mouth daily. 03/23/16   Historical Provider, MD  polyvinyl alcohol-povidone (REFRESH) 1.4-0.6 % ophthalmic solution Apply 1-2 drops to eye daily as needed (for dry eye relief).    Historical Provider, MD  tiZANidine (ZANAFLEX) 4 MG tablet Take 8 mg by mouth 3 (three) times daily.     Historical Provider, MD  zolpidem (AMBIEN) 5 MG tablet Take 5 mg by mouth at bedtime as needed for  sleep. Sleep     Historical Provider, MD    Family History Family History  Problem Relation Age of Onset  . Depression Paternal Aunt   . Depression Paternal Uncle   . Depression Maternal Grandfather     Social History Social History  Substance Use Topics  . Smoking status: Never Smoker  . Smokeless tobacco: Never Used  . Alcohol use No     Comment: occ     Allergies   Baclofen and Lorazepam   Review of Systems Review of Systems  A complete 10 system review of systems was obtained and all systems are negative except as noted in the HPI and PMH.   Physical Exam Updated Vital Signs BP (!) 124/102 (BP Location: Right Arm)   Pulse 107   Temp 97.9 F (36.6 C) (Oral)   Resp  18   Ht 5\' 2"  (1.575 m)   Wt 180 lb (81.6 kg)   SpO2 96%   BMI 32.92 kg/m   Physical Exam  Constitutional: She is oriented to person, place, and time. She appears well-developed and well-nourished. No distress.  HENT:  Head: Normocephalic and atraumatic.  Mouth/Throat: Oropharynx is clear and moist and mucous membranes are normal. No oropharyngeal exudate.  Eyes: Conjunctivae and EOM are normal. Pupils are equal, round, and reactive to light.  Neck: Normal range of motion. Neck supple.  No meningismus.  Cardiovascular: Normal rate, regular rhythm, normal heart sounds and intact distal pulses.   No murmur heard. Tachycardic in the 100's.  Pulmonary/Chest: Effort normal and breath sounds normal. No respiratory distress.  Abdominal: Soft. There is tenderness. There is guarding. There is no rebound.  Epigastric and periumbilical abdominal tenderness with guarding.  Musculoskeletal: Normal range of motion. She exhibits no edema or tenderness.  Neurological: She is alert and oriented to person, place, and time. No cranial nerve deficit. She exhibits normal muscle tone. Coordination normal.   5/5 strength throughout. CN 2-12 intact.Equal grip strength.   Skin: Skin is warm.  Psychiatric: She has a  normal mood and affect. Her behavior is normal.  Nursing note and vitals reviewed.    ED Treatments / Results  Labs (all labs ordered are listed, but only abnormal results are displayed) Labs Reviewed  LIPASE, BLOOD - Abnormal; Notable for the following:       Result Value   Lipase 52 (*)    All other components within normal limits  COMPREHENSIVE METABOLIC PANEL - Abnormal; Notable for the following:    Glucose, Bld 131 (*)    Creatinine, Ser 1.01 (*)    Total Protein 8.4 (*)    Alkaline Phosphatase 37 (*)    All other components within normal limits  CBC - Abnormal; Notable for the following:    WBC 13.2 (*)    Platelets 475 (*)    All other components within normal limits  URINALYSIS, ROUTINE W REFLEX MICROSCOPIC  PREGNANCY, URINE    EKG  EKG Interpretation None       Radiology Ct Abdomen Pelvis W Contrast  Result Date: 11/05/2016 CLINICAL DATA:  Vomiting and diarrhea of 2 weeks duration, associated with generalized abdominal pain. EXAM: CT ABDOMEN AND PELVIS WITH CONTRAST TECHNIQUE: Multidetector CT imaging of the abdomen and pelvis was performed using the standard protocol following bolus administration of intravenous contrast. CONTRAST:  100mL ISOVUE-300 IOPAMIDOL (ISOVUE-300) INJECTION 61% COMPARISON:  03/27/2015 FINDINGS: Lower chest: No acute abnormality. Hepatobiliary: No focal liver abnormality is seen. No gallstones, gallbladder wall thickening, or biliary dilatation. Pancreas: Unremarkable. No pancreatic ductal dilatation or surrounding inflammatory changes. Spleen: Normal in size without focal abnormality. Adrenals/Urinary Tract: 6 cm benign right renal cyst, unchanged from at least 12/29/2013. No suspicious renal parenchymal lesions. No hydronephrosis or ureteral dilatation. No urinary calculi. Urinary bladder is unremarkable. Stomach/Bowel: Marked distention of the stomach although oral contrast has reached the colon. This could represent a gastroparesis. Small  bowel and colon are unremarkable. Prior hysterectomy. No acute inflammation of bowel. No enteric obstruction. No extraluminal air. Vascular/Lymphatic: The abdominal aorta is normal in caliber with mild atherosclerotic calcification. No adenopathy in the abdomen or pelvis. Reproductive: Status post hysterectomy. No adnexal masses. Other: No focal inflammation.  No ascites. Musculoskeletal: No significant skeletal lesion. IMPRESSION: 1. Marked gastric distention without evidence of mass or inflammation. Gastric outlet obstruction or gastroparesis may be considerations. 2. No acute  inflammatory changes are evident in the abdomen or pelvis. 3. Stable benign right renal cyst measuring over 6 cm. Electronically Signed   By: Ellery Plunk M.D.   On: 11/05/2016 00:31   Xr Foot Complete Right  Result Date: 11/04/2016 Stable fractures and lisfranc complex with weight bearing   Procedures Procedures (including critical care time)  DIAGNOSTIC STUDIES: Oxygen Saturation is 96% on RA, adequate by my interpretation.    COORDINATION OF CARE: 11:44 PM Discussed treatment plan with pt at bedside and pt agreed to plan.  Medications Ordered in ED Medications - No data to display   Initial Impression / Assessment and Plan / ED Course  I have reviewed the triage vital signs and the nursing notes.  Pertinent labs & imaging results that were available during my care of the patient were reviewed by me and considered in my medical decision making (see chart for details).  Clinical Course    Patient presents with 2 weeks of abdominal pain with vomiting and diarrhea that has been intermittent. States has been worsening and she is unable to keep anything down. Denies fever but has had chills. Symptoms [place] after [place] trip. She was seen in the ED 2 days ago after a seizure with a negative workup. States she is not able to keep down her seizure medicine or pain medicine. She saw her orthopedist today regarding  her foot fracture.  Patient is in no distress she has moist mucous membranes. Labs show leukocytosis  Patient feels improved after nausea medication and fluids in the ED. CT scan shows distended stomach without contrast reaches throughout the colon. Suspect gastroparesis. Gastric outlet obstruction seems less likely.  We'll start Reglan and referred to GI.  Patient is tolerating fluids in the ED without a problem.  Return precautions discussed. Final Clinical Impressions(s) / ED Diagnoses   Final diagnoses:  Nausea vomiting and diarrhea  Abdominal pain, unspecified abdominal location    New Prescriptions New Prescriptions   No medications on file   I personally performed the services described in this documentation, which was scribed in my presence. The recorded information has been reviewed and is accurate.    Glynn Octave, MD 11/05/16 [phone]

## 2016-11-05 ENCOUNTER — Encounter (HOSPITAL_COMMUNITY): Payer: Self-pay | Admitting: Radiology

## 2016-11-05 DIAGNOSIS — R1084 Generalized abdominal pain: Secondary | ICD-10-CM | POA: Diagnosis not present

## 2016-11-05 LAB — COMPREHENSIVE METABOLIC PANEL
ALT: 17 U/L (ref 14–54)
AST: 27 U/L (ref 15–41)
Albumin: 4.4 g/dL (ref 3.5–5.0)
Alkaline Phosphatase: 37 U/L — ABNORMAL LOW (ref 38–126)
Anion gap: 12 (ref 5–15)
BILIRUBIN TOTAL: 0.4 mg/dL (ref 0.3–1.2)
BUN: 12 mg/dL (ref 6–20)
CO2: 22 mmol/L (ref 22–32)
CREATININE: 1.01 mg/dL — AB (ref 0.44–1.00)
Calcium: 9.5 mg/dL (ref 8.9–10.3)
Chloride: 104 mmol/L (ref 101–111)
Glucose, Bld: 131 mg/dL — ABNORMAL HIGH (ref 65–99)
POTASSIUM: 3.8 mmol/L (ref 3.5–5.1)
Sodium: 138 mmol/L (ref 135–145)
Total Protein: 8.4 g/dL — ABNORMAL HIGH (ref 6.5–8.1)

## 2016-11-05 LAB — URINALYSIS, ROUTINE W REFLEX MICROSCOPIC
BILIRUBIN URINE: NEGATIVE
Bacteria, UA: NONE SEEN
Glucose, UA: NEGATIVE mg/dL
Ketones, ur: NEGATIVE mg/dL
Leukocytes, UA: NEGATIVE
Nitrite: NEGATIVE
PH: 7 (ref 5.0–8.0)
Protein, ur: NEGATIVE mg/dL
SPECIFIC GRAVITY, URINE: 1.021 (ref 1.005–1.030)

## 2016-11-05 LAB — LIPASE, BLOOD: Lipase: 52 U/L — ABNORMAL HIGH (ref 11–51)

## 2016-11-05 MED ORDER — PROMETHAZINE HCL 25 MG PO TABS: 25.0000 mg | ORAL_TABLET | Freq: Four times a day (QID) | ORAL | 0 refills | Status: DC | PRN

## 2016-11-05 MED ORDER — METOCLOPRAMIDE HCL 10 MG PO TABS: 10.0000 mg | ORAL_TABLET | Freq: Four times a day (QID) | ORAL | 0 refills | Status: DC

## 2016-11-05 MED ORDER — ZOLPIDEM TARTRATE 5 MG PO TABS
5.0000 mg | ORAL_TABLET | Freq: Once | ORAL | Status: AC
  Administered 2016-11-05: 5 mg via ORAL
  Filled 2016-11-05: qty 1

## 2016-11-05 MED ORDER — RANITIDINE HCL 150 MG PO CAPS: 150.0000 mg | ORAL_CAPSULE | Freq: Every day | ORAL | 0 refills | Status: DC

## 2016-11-05 MED ORDER — METOCLOPRAMIDE HCL 5 MG/ML IJ SOLN
10.0000 mg | Freq: Once | INTRAMUSCULAR | Status: AC
  Administered 2016-11-05: 10 mg via INTRAVENOUS
  Filled 2016-11-05: qty 2

## 2016-11-05 NOTE — Discharge Instructions (Signed)
Your CT shows an enlarged and distended stomach. Take the medications as prescribed and follow up with the stomach doctor. Return to the ED if you develop new or worsening symptoms.

## 2016-11-08 ENCOUNTER — Encounter (INDEPENDENT_AMBULATORY_CARE_PROVIDER_SITE_OTHER): Payer: Self-pay | Admitting: *Deleted

## 2016-11-08 ENCOUNTER — Ambulatory Visit (INDEPENDENT_AMBULATORY_CARE_PROVIDER_SITE_OTHER): Payer: Medicare Other | Admitting: Internal Medicine

## 2016-11-08 ENCOUNTER — Encounter (INDEPENDENT_AMBULATORY_CARE_PROVIDER_SITE_OTHER): Payer: Self-pay | Admitting: Internal Medicine

## 2016-11-08 VITALS — BP 94/80 | HR 100 | Temp 98.2°F | Ht 62.0 in | Wt 182.0 lb

## 2016-11-08 DIAGNOSIS — R1013 Epigastric pain: Secondary | ICD-10-CM

## 2016-11-08 DIAGNOSIS — R197 Diarrhea, unspecified: Secondary | ICD-10-CM | POA: Diagnosis not present

## 2016-11-08 MED ORDER — METRONIDAZOLE 500 MG PO TABS: 500.0000 mg | ORAL_TABLET | Freq: Two times a day (BID) | ORAL | 0 refills | Status: DC

## 2016-11-08 NOTE — Patient Instructions (Signed)
Labs and US.  GI pathogen. Bland diet.

## 2016-11-08 NOTE — Progress Notes (Signed)
Subjective:    Patient ID: Mariah Ibarra, female    DOB: September 17, 1971, 46 y.o.   MRN: [id number]  HPI Referred by the ED for N,V, diarrhea x 2 weeks. . Seen in the ED 11/04/2016. Hx of rt foot fracture and hx of seizures.  She had been having up to 3 episodes of diarrhea daily.  Has phenergan at home for nausea and vomiting.  She also had epigastric tenderness.  No recent antibiotics.  She says she she is keeping boost down x 3 days. She is keeping her medications down. She is having 5 stools a day and her stools are watery.  Today she says she feels bad. She has a headache. She says her eye sight is blurry. She has not had a fever. She has epigastric tenderness.  Taking Actos for Trigeminal neuralgia  Her symptoms started while at the beach 2 weeks ago. She ate at the [employer].   11/04/2016 CT abdomen/pelvis with Cm: N,V,Diarrhea. IMPRESSION: 1. Marked gastric distention without evidence of mass or inflammation. Gastric outlet obstruction or gastroparesis may be considerations. 2. No acute inflammatory changes are evident in the abdomen or pelvis. 3. Stable benign right renal cyst measuring over 6 cm.    Review of Systems Past Medical History:  Diagnosis Date  . Anxiety   . Hypercholesteremia   . Hypertension   . Panic attacks   . PONV (postoperative nausea and vomiting)   . PTSD (post-traumatic stress disorder)   . Seizures (HCC)    2 months since last seizure.  . Stroke [facility])    short term memory loss from 3rd brain surgery.  . Trigeminal neuralgia     Past Surgical History:  Procedure Laterality Date  . ABDOMINAL HYSTERECTOMY    . ABDOMINOPLASTY    . APPENDECTOMY    . bladder tack    . BRAIN SURGERY     x4  . CERVICAL ABLATION    . Gamma knife Procedure November 2013    . ORIF ANKLE FRACTURE Right 06/03/2016   Procedure: OPEN REDUCTION INTERNAL FIXATION (ORIF) RIGHT ANKLE;  Surgeon: Vickki Hearing, MD;  Location: [facility] ORS;  Service: Orthopedics;   Laterality: Right;  . TONSILLECTOMY      Allergies  Allergen Reactions  . Baclofen Other (See Comments)    Seizure activity  . Lorazepam Other (See Comments)    Doesn't tolerate    Current Outpatient Prescriptions on File Prior to Visit  Medication Sig Dispense Refill  . bismuth subsalicylate (PEPTO BISMOL) 262 MG chewable tablet Chew 524 mg by mouth as needed for diarrhea or loose stools.    . cholecalciferol (GNP VITAMIN D) 400 UNITS TABS Take 1 tablet by mouth Daily.    . citalopram (CELEXA) 40 MG tablet Take 40 mg by mouth daily.    . diphenhydramine-acetaminophen (TYLENOL PM) 25-500 MG TABS tablet Take 1 tablet by mouth at bedtime as needed (for rest).    Marland Kitchen gabapentin (NEURONTIN) 600 MG tablet Take 600 mg by mouth 3 (three) times daily.     Marland Kitchen HYDROcodone-acetaminophen (NORCO) 7.5-325 MG tablet Take 1 tablet by mouth every 4 (four) hours as needed for moderate pain. 42 tablet 0  . ibuprofen (ADVIL,MOTRIN) 800 MG tablet Take 800 mg by mouth 3 (three) times daily.     . Ibuprofen-Diphenhydramine Cit (ADVIL PM) 200-38 MG TABS Take 1-2 tablets by mouth daily as needed (for rest).    Marland Kitchen lamoTRIgine (LAMICTAL) 25 MG tablet Take 100 mg by mouth  2 (two) times daily.    Marland Kitchen. levETIRAcetam (KEPPRA) 500 MG tablet Take 1,500 mg by mouth 3 (three) times daily.     Marland Kitchen. lidocaine (XYLOCAINE) 2 % jelly Apply 1 application topically as needed.     Marland Kitchen. lisinopril (PRINIVIL) 20 MG tablet Take 1 tablet (20 mg total) by mouth daily. 30 tablet 0  . metoCLOPramide (REGLAN) 10 MG tablet Take 1 tablet (10 mg total) by mouth every 6 (six) hours. 30 tablet 0  . mirtazapine (REMERON) 30 MG tablet Take 30 mg by mouth at bedtime.    . ondansetron (ZOFRAN ODT) 4 MG disintegrating tablet Take 1 tablet (4 mg total) by mouth every 8 (eight) hours as needed for nausea or vomiting. (Patient taking differently: Take 4 mg by mouth every 6 (six) hours as needed for nausea or vomiting. ) 20 tablet 0  . pioglitazone (ACTOS) 15 MG  tablet Take 1 tablet by mouth daily.  11  . polyvinyl alcohol-povidone (REFRESH) 1.4-0.6 % ophthalmic solution Apply 1-2 drops to eye daily as needed (for dry eye relief).    . ranitidine (ZANTAC) 150 MG capsule Take 1 capsule (150 mg total) by mouth daily. 30 capsule 0  . tiZANidine (ZANAFLEX) 4 MG tablet Take 8 mg by mouth 3 (three) times daily.     Marland Kitchen. zolpidem (AMBIEN) 5 MG tablet Take 5 mg by mouth at bedtime as needed for sleep. Sleep     . promethazine (PHENERGAN) 25 MG tablet Take 1 tablet (25 mg total) by mouth every 6 (six) hours as needed for nausea or vomiting. (Patient not taking: Reported on 11/08/2016) 30 tablet 0   Current Facility-Administered Medications on File Prior to Visit  Medication Dose Route Frequency Provider Last Rate Last Dose  . vancomycin (VANCOCIN) IVPB 1000 mg/200 mL premix  1,000 mg Intravenous Once Vickki HearingStanley E Harrison, MD           Objective:   Physical Exam Blood pressure 94/80, pulse 100, temperature 98.2 F (36.8 C), height 5\' 2"  (1.575 m), weight 182 lb (82.6 kg), last menstrual period 11/01/2013.  Alert and oriented. Skin warm and dry. Oral mucosa is moist.   . Sclera anicteric, conjunctivae is pink. Thyroid not enlarged. No cervical lymphadenopathy. Lungs clear. Heart regular rate and rhythm.  Abdomen is soft. Bowel sounds are positive. No hepatomegaly. No abdominal masses felt. Epigastric tenderness.  No edema to lower extremities.         Assessment & Plan:  N,V, Diarrhea. Epigastric tenderness. GI pathogen. Hepatic function, US abdomen.  Rx for Flagyl 500mg  BID x 7 days.

## 2016-11-11 ENCOUNTER — Ambulatory Visit (HOSPITAL_COMMUNITY)
Admission: RE | Admit: 2016-11-11 | Discharge: 2016-11-11 | Disposition: A | Discharge status: Home / Self Care | Payer: Medicare Other | Source: Ambulatory Visit | Attending: Internal Medicine | Admitting: Internal Medicine

## 2016-11-11 DIAGNOSIS — R1013 Epigastric pain: Secondary | ICD-10-CM

## 2016-11-11 DIAGNOSIS — N281 Cyst of kidney, acquired: Secondary | ICD-10-CM | POA: Insufficient documentation

## 2016-11-23 ENCOUNTER — Telehealth (INDEPENDENT_AMBULATORY_CARE_PROVIDER_SITE_OTHER): Payer: Self-pay | Admitting: Internal Medicine

## 2016-11-23 NOTE — Telephone Encounter (Signed)
Patient called, stated that she had an abdominal ultrasound almost two weeks ago and she still has not heard anything.  She stated that she's still having a lot of discomfort, not able to eat, can't keep food down and only drinking liquids.  She stated that she's also out of the medication that the ER doctor and you put her on.  She would like to know the next step.  [phone]

## 2016-11-24 NOTE — Telephone Encounter (Signed)
This has been addressed. Dr. Tyron RussellBoles over read her xray and CT. She is to go to lab and get blood work and stool sample.

## 2016-11-25 ENCOUNTER — Ambulatory Visit (INDEPENDENT_AMBULATORY_CARE_PROVIDER_SITE_OTHER): Payer: Self-pay | Admitting: Orthopaedic Surgery

## 2016-12-08 DIAGNOSIS — G43711 Chronic migraine without aura, intractable, with status migrainosus: Secondary | ICD-10-CM | POA: Diagnosis not present

## 2016-12-08 DIAGNOSIS — G500 Trigeminal neuralgia: Secondary | ICD-10-CM | POA: Diagnosis not present

## 2016-12-08 DIAGNOSIS — G513 Clonic hemifacial spasm: Secondary | ICD-10-CM | POA: Diagnosis not present

## 2016-12-08 DIAGNOSIS — F419 Anxiety disorder, unspecified: Secondary | ICD-10-CM | POA: Diagnosis not present

## 2016-12-08 DIAGNOSIS — R42 Dizziness and giddiness: Secondary | ICD-10-CM | POA: Diagnosis not present

## 2016-12-08 DIAGNOSIS — R1084 Generalized abdominal pain: Secondary | ICD-10-CM | POA: Diagnosis not present

## 2016-12-16 DIAGNOSIS — Z79899 Other long term (current) drug therapy: Secondary | ICD-10-CM | POA: Diagnosis not present

## 2016-12-16 DIAGNOSIS — R42 Dizziness and giddiness: Secondary | ICD-10-CM | POA: Diagnosis not present

## 2016-12-16 DIAGNOSIS — R1084 Generalized abdominal pain: Secondary | ICD-10-CM | POA: Diagnosis not present

## 2016-12-16 DIAGNOSIS — F419 Anxiety disorder, unspecified: Secondary | ICD-10-CM | POA: Diagnosis not present

## 2016-12-16 DIAGNOSIS — G513 Clonic hemifacial spasm: Secondary | ICD-10-CM | POA: Diagnosis not present

## 2016-12-16 DIAGNOSIS — G500 Trigeminal neuralgia: Secondary | ICD-10-CM | POA: Diagnosis not present

## 2016-12-16 DIAGNOSIS — G43711 Chronic migraine without aura, intractable, with status migrainosus: Secondary | ICD-10-CM | POA: Diagnosis not present

## 2016-12-22 ENCOUNTER — Telehealth (INDEPENDENT_AMBULATORY_CARE_PROVIDER_SITE_OTHER): Payer: Self-pay | Admitting: Internal Medicine

## 2016-12-22 ENCOUNTER — Other Ambulatory Visit (INDEPENDENT_AMBULATORY_CARE_PROVIDER_SITE_OTHER): Payer: Self-pay | Admitting: Internal Medicine

## 2016-12-22 DIAGNOSIS — R110 Nausea: Secondary | ICD-10-CM

## 2016-12-22 DIAGNOSIS — R634 Abnormal weight loss: Secondary | ICD-10-CM

## 2016-12-22 NOTE — Telephone Encounter (Signed)
Mariah Ibarra, EGD 

## 2016-12-23 ENCOUNTER — Encounter (INDEPENDENT_AMBULATORY_CARE_PROVIDER_SITE_OTHER): Payer: Self-pay | Admitting: *Deleted

## 2016-12-23 DIAGNOSIS — R110 Nausea: Secondary | ICD-10-CM | POA: Insufficient documentation

## 2016-12-23 DIAGNOSIS — R634 Abnormal weight loss: Secondary | ICD-10-CM | POA: Insufficient documentation

## 2016-12-23 NOTE — Telephone Encounter (Signed)
EGD sch'd 01/13/17 at 300 (200), patient aware, instructions mailed

## 2017-01-13 ENCOUNTER — Encounter (HOSPITAL_COMMUNITY)
Admission: RE | Disposition: A | Discharge status: Home / Self Care | Payer: Self-pay | Source: Ambulatory Visit | Attending: Internal Medicine

## 2017-01-13 ENCOUNTER — Ambulatory Visit (HOSPITAL_COMMUNITY)
Admission: RE | Admit: 2017-01-13 | Discharge: 2017-01-13 | Disposition: A | Discharge status: Home / Self Care | Payer: Medicare Other | Source: Ambulatory Visit | Attending: Internal Medicine | Admitting: Internal Medicine

## 2017-01-13 ENCOUNTER — Encounter (HOSPITAL_COMMUNITY): Payer: Self-pay | Admitting: *Deleted

## 2017-01-13 DIAGNOSIS — Z79899 Other long term (current) drug therapy: Secondary | ICD-10-CM | POA: Diagnosis not present

## 2017-01-13 DIAGNOSIS — R634 Abnormal weight loss: Secondary | ICD-10-CM | POA: Diagnosis not present

## 2017-01-13 DIAGNOSIS — Z8673 Personal history of transient ischemic attack (TIA), and cerebral infarction without residual deficits: Secondary | ICD-10-CM | POA: Diagnosis not present

## 2017-01-13 DIAGNOSIS — R110 Nausea: Secondary | ICD-10-CM | POA: Diagnosis not present

## 2017-01-13 DIAGNOSIS — R933 Abnormal findings on diagnostic imaging of other parts of digestive tract: Secondary | ICD-10-CM | POA: Insufficient documentation

## 2017-01-13 DIAGNOSIS — I10 Essential (primary) hypertension: Secondary | ICD-10-CM | POA: Diagnosis not present

## 2017-01-13 DIAGNOSIS — K2950 Unspecified chronic gastritis without bleeding: Secondary | ICD-10-CM | POA: Diagnosis not present

## 2017-01-13 DIAGNOSIS — R112 Nausea with vomiting, unspecified: Secondary | ICD-10-CM | POA: Diagnosis not present

## 2017-01-13 DIAGNOSIS — K3189 Other diseases of stomach and duodenum: Secondary | ICD-10-CM | POA: Diagnosis not present

## 2017-01-13 DIAGNOSIS — K2970 Gastritis, unspecified, without bleeding: Secondary | ICD-10-CM | POA: Diagnosis not present

## 2017-01-13 DIAGNOSIS — E7800 Pure hypercholesterolemia, unspecified: Secondary | ICD-10-CM | POA: Diagnosis not present

## 2017-01-13 DIAGNOSIS — Z6832 Body mass index (BMI) 32.0-32.9, adult: Secondary | ICD-10-CM | POA: Diagnosis not present

## 2017-01-13 DIAGNOSIS — K228 Other specified diseases of esophagus: Secondary | ICD-10-CM | POA: Diagnosis not present

## 2017-01-13 HISTORY — PX: ESOPHAGOGASTRODUODENOSCOPY: SHX5428

## 2017-01-13 SURGERY — EGD (ESOPHAGOGASTRODUODENOSCOPY)
Anesthesia: Moderate Sedation

## 2017-01-13 MED ORDER — SODIUM CHLORIDE 0.9 % IV SOLN
INTRAVENOUS | Status: DC
  Administered 2017-01-13: 12:00:00 via INTRAVENOUS

## 2017-01-13 MED ORDER — LIDOCAINE VISCOUS 2 % MT SOLN
OROMUCOSAL | Status: AC
  Filled 2017-01-13: qty 15

## 2017-01-13 MED ORDER — MIDAZOLAM HCL 5 MG/5ML IJ SOLN
INTRAMUSCULAR | Status: AC
  Filled 2017-01-13: qty 10

## 2017-01-13 MED ORDER — SODIUM CHLORIDE 0.9% FLUSH
INTRAVENOUS | Status: AC
  Filled 2017-01-13: qty 10

## 2017-01-13 MED ORDER — MEPERIDINE HCL 50 MG/ML IJ SOLN
INTRAMUSCULAR | Status: DC | PRN
  Administered 2017-01-13 (×2): 25 mg

## 2017-01-13 MED ORDER — PROMETHAZINE HCL 25 MG/ML IJ SOLN
12.5000 mg | Freq: Once | INTRAMUSCULAR | Status: AC
  Administered 2017-01-13: 12.5 mg via INTRAVENOUS

## 2017-01-13 MED ORDER — LIDOCAINE VISCOUS 2 % MT SOLN
OROMUCOSAL | Status: DC | PRN
  Administered 2017-01-13: 1 via OROMUCOSAL

## 2017-01-13 MED ORDER — PROMETHAZINE HCL 25 MG/ML IJ SOLN
INTRAMUSCULAR | Status: DC | PRN
  Administered 2017-01-13: 12.5 mg via INTRAVENOUS

## 2017-01-13 MED ORDER — MIDAZOLAM HCL 5 MG/5ML IJ SOLN
INTRAMUSCULAR | Status: AC
  Filled 2017-01-13: qty 5

## 2017-01-13 MED ORDER — MEPERIDINE HCL 50 MG/ML IJ SOLN
INTRAMUSCULAR | Status: AC
  Filled 2017-01-13: qty 1

## 2017-01-13 MED ORDER — PROMETHAZINE HCL 25 MG/ML IJ SOLN
INTRAMUSCULAR | Status: AC
  Filled 2017-01-13: qty 1

## 2017-01-13 MED ORDER — PANTOPRAZOLE SODIUM 40 MG PO TBEC: 40.0000 mg | DELAYED_RELEASE_TABLET | Freq: Every day | ORAL | 5 refills | Status: DC

## 2017-01-13 MED ORDER — MIDAZOLAM HCL 5 MG/5ML IJ SOLN
INTRAMUSCULAR | Status: DC | PRN
  Administered 2017-01-13: 2 mg via INTRAVENOUS
  Administered 2017-01-13 (×4): 3 mg via INTRAVENOUS

## 2017-01-13 NOTE — Op Note (Signed)
[facility] Patient Name: Mariah Ibarra Procedure Date: 01/13/2017 12:13 PM MRN: [id number] Date of Birth: [birth year] Attending MD: Lionel December , MD CSN: [id number] Age: 46 Admit Type: Outpatient Procedure:                Upper GI endoscopy Indications:              Nausea with vomiting, Weight loss Providers:                Lionel December, MD, Nena Polio, RN, Lollie Marrow. Lake,                            Pensions consultant Referring MD:             Frederich Chick., MD Medicines:                Promethazine 25 mg IV, viscous lidocaine for                            topical anesthesia, Meperidine 50 mg IV, Midazolam                            14 mg IV Complications:            No immediate complications. Estimated Blood Loss:     Estimated blood loss was minimal. Procedure:                Pre-Anesthesia Assessment:                           - Prior to the procedure, a History and Physical                            was performed, and patient medications and                            allergies were reviewed. The patient's tolerance of                            previous anesthesia was also reviewed. The risks                            and benefits of the procedure and the sedation                            options and risks were discussed with the patient.                            All questions were answered, and informed consent                            was obtained. Prior Anticoagulants: The patient                            last took ibuprofen 3 days prior to the procedure.  ASA Grade Assessment: III - A patient with severe                            systemic disease. After reviewing the risks and                            benefits, the patient was deemed in satisfactory                            condition to undergo the procedure.                           After obtaining informed consent, the endoscope was                            passed under  direct vision. Throughout the                            procedure, the patient's blood pressure, pulse, and                            oxygen saturations were monitored continuously. The                            [id number] ([id number] scope was introduced through the                            mouth, and advanced to the second part of duodenum.                            The upper GI endoscopy was accomplished without                            difficulty. The patient tolerated the procedure                            well. Scope In: 12:37:26 PM Scope Out: 12:46:05 PM Total Procedure Duration: 0 hours 8 minutes 39 seconds  Findings:      The examined esophagus was normal.      The Z-line was irregular and was found 36 cm from the incisors.      Multiple small scars at antrum due to healed ulcer found in the gastric       antrum.      Patchy mild inflammation characterized by congestion (edema), erythema       and granularity was found in the gastric antrum. Biopsies were taken       with a cold forceps for histology.      The exam of the stomach was otherwise normal.      The duodenal bulb and second portion of the duodenum were normal. Impression:               - Normal esophagus.                           - Z-line irregular, 36 cm from  the incisors.                           - Multiple cars in the gastric antrum.                           - Gastritis. Biopsied.                           - Normal duodenal bulb and second portion of the                            duodenum. Moderate Sedation:      Moderate (conscious) sedation was administered by the endoscopy nurse       and supervised by the endoscopist. The following parameters were       monitored: oxygen saturation, heart rate, blood pressure, CO2       capnography and response to care. Total physician intraservice time was       22 minutes. Recommendation:           - Resume previous diet today.                           -  Continue present medications.                           - Await pathology results.                           - No aspirin, ibuprofen, naproxen, or other                            non-steroidal anti-inflammatory drugs.                           - Use Protonix (pantoprazole) 40 mg PO daily.                           - Patient has a contact number available for                            emergencies. The signs and symptoms of potential                            delayed complications were discussed with the                            patient. Return to normal activities tomorrow.                            Written discharge instructions were provided to the                            patient. Procedure Code(s):        --- Professional ---  16109, Esophagogastroduodenoscopy, flexible,                            transoral; with biopsy, single or multiple                           99152, Moderate sedation services provided by the                            same physician or other qualified health care                            professional performing the diagnostic or                            therapeutic service that the sedation supports,                            requiring the presence of an independent trained                            observer to assist in the monitoring of the                            patient's level of consciousness and physiological                            status; initial 15 minutes of intraservice time,                            patient age 78 years or older Diagnosis Code(s):        --- Professional ---                           K22.8, Other specified diseases of esophagus                           K31.89, Other diseases of stomach and duodenum                           K29.70, Gastritis, unspecified, without bleeding                           R11.2, Nausea with vomiting, unspecified                           R63.4, Abnormal weight  loss CPT copyright 2016 American Medical Association. All rights reserved. The codes documented in this report are preliminary and upon coder review may  be revised to meet current compliance requirements. Lionel December, MD Lionel December, MD 01/13/2017 1:06:44 PM This report has been signed electronically. Number of Addenda: 0

## 2017-01-13 NOTE — Discharge Instructions (Signed)
Resume usual medications. Keep ibuprofen use to minimum. Pantoprazole 40 mg by mouth 30 minutes before breakfast daily. No driving for 24 hours. Physician will call with biopsy results and further recommendations.     Esophagogastroduodenoscopy, Care After Refer to this sheet in the next few weeks. These instructions provide you with information about caring for yourself after your procedure. Your health care provider may also give you more specific instructions. Your treatment has been planned according to current medical practices, but problems sometimes occur. Call your health care provider if you have any problems or questions after your procedure. What can I expect after the procedure? After the procedure, it is common to have:  A sore throat.  Nausea.  Bloating.  Dizziness.  Fatigue. Follow these instructions at home:  Do not eat or drink anything until the numbing medicine (local anesthetic) has worn off and your gag reflex has returned. You will know that the local anesthetic has worn off when you can swallow comfortably.  Do not drive for 24 hours if you received a medicine to help you relax (sedative).  If your health care provider took a tissue sample for testing during the procedure, make sure to get your test results. This is your responsibility. Ask your health care provider or the department performing the test when your results will be ready.  Keep all follow-up visits as told by your health care provider. This is important. Contact a health care provider if:  You cannot stop coughing.  You are not urinating.  You are urinating less than usual. Get help right away if:  You have trouble swallowing.  You cannot eat or drink.  You have throat or chest pain that gets worse.  You are dizzy or light-headed.  You faint.  You have nausea or vomiting.  You have chills.  You have a fever.  You have severe abdominal pain.  You have black, tarry, or  bloody stools. This information is not intended to replace advice given to you by your health care provider. Make sure you discuss any questions you have with your health care provider. Document Released: 10/04/2012 Document Revised: 03/25/2016 Document Reviewed: 09/11/2015 Elsevier Interactive Patient Education  2017 [employer]

## 2017-01-13 NOTE — H&P (Signed)
Mariah Ibarra is an 46 y.o. female.   Chief Complaint: Patient is here for EGD. HPI: She is 46 year old Caucasian female with multiple medical problems who presents with three-month history of nausea with spermatic vomiting. She has daily nausea which is worse after meals. She is trying to Maintained calorie intake by drinking boost or an sure but she has lost 20 pounds. She denies hematemesis or melena. She had normal transaminases serum calcium. She was seen in emergency room in January 2018 and abdominopelvic CT which revealed very large stomach. Following a visit to our office she had upper abdominal ultrasound and it is negative for cholelithiasis. She has been having intermittent diarrhea. She denies heartburn frequent burping or dysphagia. She also denies abdominal pain. She does not take NSAIDs. She has been on metoclopramide and she believes it has decreased her nausea.  Past Medical History:  Diagnosis Date  . Anxiety   . Hypercholesteremia   . Hypertension   . Panic attacks   . PONV (postoperative nausea and vomiting)   . PTSD (post-traumatic stress disorder)   . Seizures (HCC)    2 months since last seizure.  . Stroke [facility](HCC)    short term memory loss from 3rd brain surgery.  . Trigeminal neuralgia     Past Surgical History:  Procedure Laterality Date  . ABDOMINAL HYSTERECTOMY    . ABDOMINOPLASTY    . APPENDECTOMY    . bladder tack    . BRAIN SURGERY     x4  . CERVICAL ABLATION    . Gamma knife Procedure November 2013    . ORIF ANKLE FRACTURE Right 06/03/2016   Procedure: OPEN REDUCTION INTERNAL FIXATION (ORIF) RIGHT ANKLE;  Surgeon: Vickki HearingStanley E Harrison, MD;  Location: [facility];  Service: Orthopedics;  Laterality: Right;  . TONSILLECTOMY      Family History  Problem Relation Age of Onset  . Depression Paternal Aunt   . Depression Paternal Uncle   . Depression Maternal Grandfather    Social History:  reports that she has never smoked. She has never used smokeless  tobacco. She reports that she does not drink alcohol or use drugs.  Allergies:  Allergies  Allergen Reactions  . Baclofen Other (See Comments)    Seizure activity  . Lorazepam Other (See Comments)    Doesn't tolerate    Facility-Administered Medications Prior to Admission  Medication Dose Route Frequency Provider Last Rate Last Dose  . vancomycin (VANCOCIN) IVPB 1000 mg/200 mL premix  1,000 mg Intravenous Once Vickki HearingStanley E Harrison, MD       Medications Prior to Admission  Medication Sig Dispense Refill  . atorvastatin (LIPITOR) 80 MG tablet Take 80 mg by mouth daily.    . cholecalciferol (VITAMIN D) 1000 units tablet Take 1,000 Units by mouth 4 (four) times daily.    Marland Kitchen. gabapentin (NEURONTIN) 600 MG tablet Take 1,200 mg by mouth 3 (three) times daily.     Marland Kitchen. HYDROmorphone (DILAUDID) 4 MG tablet Take 4 mg by mouth every 6 (six) hours as needed for severe pain.    Marland Kitchen. ibuprofen (ADVIL,MOTRIN) 400 MG tablet Take 400 mg by mouth every 6 (six) hours as needed for headache or moderate pain.    Marland Kitchen. levETIRAcetam (KEPPRA) 750 MG tablet Take 1,500 mg by mouth 2 (two) times daily.    Marland Kitchen. lidocaine (XYLOCAINE) 2 % jelly Apply 1 application topically as needed (pain).     Marland Kitchen. lisinopril (PRINIVIL) 20 MG tablet Take 1 tablet (20 mg total) by mouth  daily. 30 tablet 0  . magnesium hydroxide (MILK OF MAGNESIA) 400 MG/5ML suspension Take 15 mLs by mouth daily as needed for mild constipation.    . magnesium oxide (MAG-OX) 400 MG tablet Take 400 mg by mouth daily.    . mirtazapine (REMERON) 15 MG tablet Take 15 mg by mouth 2 (two) times daily as needed (anxiety).    . polyvinyl alcohol-povidone (REFRESH) 1.4-0.6 % ophthalmic solution Apply 1-2 drops to eye daily.     . promethazine (PHENERGAN) 25 MG suppository Place 25 mg rectally every 6 (six) hours as needed for nausea or vomiting.    Marland Kitchen tiZANidine (ZANAFLEX) 4 MG tablet Take 4 mg by mouth 4 (four) times daily as needed for muscle spasms.     Marland Kitchen zolpidem (AMBIEN)  10 MG tablet Take 10 mg by mouth at bedtime as needed for sleep.    Marland Kitchen HYDROcodone-acetaminophen (NORCO) 7.5-325 MG tablet Take 1 tablet by mouth every 4 (four) hours as needed for moderate pain. (Patient not taking: Reported on 01/11/2017) 42 tablet 0  . metoCLOPramide (REGLAN) 10 MG tablet Take 1 tablet (10 mg total) by mouth every 6 (six) hours. (Patient not taking: Reported on 01/11/2017) 30 tablet 0  . metroNIDAZOLE (FLAGYL) 500 MG tablet Take 1 tablet (500 mg total) by mouth 2 (two) times daily. (Patient not taking: Reported on 01/11/2017) 14 tablet 0  . ondansetron (ZOFRAN ODT) 4 MG disintegrating tablet Take 1 tablet (4 mg total) by mouth every 8 (eight) hours as needed for nausea or vomiting. (Patient taking differently: Take 4 mg by mouth every 6 (six) hours as needed for nausea or vomiting. ) 20 tablet 0  . promethazine (PHENERGAN) 25 MG tablet Take 1 tablet (25 mg total) by mouth every 6 (six) hours as needed for nausea or vomiting. (Patient not taking: Reported on 11/08/2016) 30 tablet 0  . ranitidine (ZANTAC) 150 MG capsule Take 1 capsule (150 mg total) by mouth daily. (Patient not taking: Reported on 01/11/2017) 30 capsule 0    No results found for this or any previous visit (from the past 48 hour(s)). No results found.  ROS  Blood pressure (!) 142/101, pulse 98, temperature 98.5 F (36.9 C), temperature source Oral, resp. rate 19, height 5\' 2"  (1.575 m), weight 175 lb (79.4 kg), last menstrual period 11/01/2013, SpO2 98 %. Physical Exam  Constitutional: She appears well-developed and well-nourished.  HENT:  Mouth/Throat: Oropharynx is clear and moist.  Eyes: Conjunctivae are normal. No scleral icterus.  Neck: No thyromegaly present.  Cardiovascular: Normal rate, regular rhythm and normal heart sounds.   No murmur heard. Respiratory: Effort normal and breath sounds normal.  GI: Soft. She exhibits no distension. There is no tenderness.  Musculoskeletal: She exhibits no edema.   Lymphadenopathy:    She has no cervical adenopathy.  Neurological: She is alert.  Skin: Skin is warm and dry.     Assessment/Plan Nausea with sporadic vomiting of 3 months duration. Weight loss. Abnormal CT revealing large dilated stomach. Diagnostic EGD.  Lionel December, MD 01/13/2017, 12:18 PM

## 2017-01-17 ENCOUNTER — Telehealth (INDEPENDENT_AMBULATORY_CARE_PROVIDER_SITE_OTHER): Payer: Self-pay | Admitting: Internal Medicine

## 2017-01-17 NOTE — Telephone Encounter (Signed)
Talked with the patient and she states that she is curious about her digestive system. She has bouts of Constipation then Diarrhea. Patient was advised that we are awaiting Bx results, and that she would be called with this .

## 2017-01-17 NOTE — Telephone Encounter (Signed)
This will be addressed with Dr.Rehman. 

## 2017-01-17 NOTE — Telephone Encounter (Signed)
Patient called, she is wanting to see if Dr. Karilyn Cotaehman will do a colonoscopy with all her issues.  She stated that he said he has never seen anything like it.  [phone]

## 2017-01-18 ENCOUNTER — Encounter (HOSPITAL_COMMUNITY): Payer: Self-pay | Admitting: Internal Medicine

## 2017-01-18 NOTE — Telephone Encounter (Signed)
Dr.Rehman is aware and he is going to call the patient with her resullts.

## 2017-01-20 ENCOUNTER — Other Ambulatory Visit (INDEPENDENT_AMBULATORY_CARE_PROVIDER_SITE_OTHER): Payer: Self-pay | Admitting: *Deleted

## 2017-01-20 DIAGNOSIS — R112 Nausea with vomiting, unspecified: Secondary | ICD-10-CM

## 2017-01-25 ENCOUNTER — Encounter (HOSPITAL_COMMUNITY)
Admission: RE | Admit: 2017-01-25 | Discharge: 2017-01-25 | Disposition: A | Discharge status: Home / Self Care | Payer: Medicare Other | Source: Ambulatory Visit | Attending: Internal Medicine | Admitting: Internal Medicine

## 2017-01-25 ENCOUNTER — Encounter (HOSPITAL_COMMUNITY): Payer: Self-pay

## 2017-01-25 DIAGNOSIS — R112 Nausea with vomiting, unspecified: Secondary | ICD-10-CM | POA: Diagnosis not present

## 2017-01-25 DIAGNOSIS — R109 Unspecified abdominal pain: Secondary | ICD-10-CM | POA: Diagnosis not present

## 2017-01-25 MED ORDER — TECHNETIUM TC 99M SULFUR COLLOID
2.0000 | Freq: Once | INTRAVENOUS | Status: AC | PRN
  Administered 2017-01-25: 2.2 via ORAL

## 2017-01-31 DIAGNOSIS — G509 Disorder of trigeminal nerve, unspecified: Secondary | ICD-10-CM | POA: Diagnosis not present

## 2017-01-31 DIAGNOSIS — G40909 Epilepsy, unspecified, not intractable, without status epilepticus: Secondary | ICD-10-CM | POA: Diagnosis not present

## 2017-01-31 DIAGNOSIS — G40009 Localization-related (focal) (partial) idiopathic epilepsy and epileptic syndromes with seizures of localized onset, not intractable, without status epilepticus: Secondary | ICD-10-CM | POA: Diagnosis not present

## 2017-02-02 ENCOUNTER — Telehealth (INDEPENDENT_AMBULATORY_CARE_PROVIDER_SITE_OTHER): Payer: Self-pay | Admitting: Internal Medicine

## 2017-02-02 NOTE — Telephone Encounter (Signed)
Patient called, would like to know what was going to happen next, more test or an appointment with the doctor.  [phone]

## 2017-02-02 NOTE — Telephone Encounter (Signed)
Notes recorded by Malissa Hippo, MD on 02/02/2017 at 1:04 PM EDT Borderline/normal GES. Unable to reach patient despite multiple attempts. [place] call patient with results. I do not believe her symptoms are due to slow stomach.

## 2017-02-03 NOTE — Telephone Encounter (Signed)
Dr.Rehman recommends a Hida Scan with [facility]. If this is normal then we will give the patient office appointment. Calling patient and forward to [place].

## 2017-02-04 ENCOUNTER — Other Ambulatory Visit (INDEPENDENT_AMBULATORY_CARE_PROVIDER_SITE_OTHER): Payer: Self-pay | Admitting: Internal Medicine

## 2017-02-04 DIAGNOSIS — R112 Nausea with vomiting, unspecified: Secondary | ICD-10-CM

## 2017-02-04 NOTE — Telephone Encounter (Signed)
HIDA scan sch'd 02/09/17 at 800 (745), npo after midnight, patient aware

## 2017-02-09 ENCOUNTER — Encounter (HOSPITAL_COMMUNITY)
Admission: RE | Admit: 2017-02-09 | Discharge: 2017-02-09 | Disposition: A | Discharge status: Home / Self Care | Payer: Medicare Other | Source: Ambulatory Visit | Attending: Internal Medicine | Admitting: Internal Medicine

## 2017-02-09 DIAGNOSIS — R112 Nausea with vomiting, unspecified: Secondary | ICD-10-CM | POA: Insufficient documentation

## 2017-02-15 ENCOUNTER — Encounter (HOSPITAL_COMMUNITY): Payer: Self-pay

## 2017-02-15 ENCOUNTER — Encounter (HOSPITAL_COMMUNITY)
Admission: RE | Admit: 2017-02-15 | Discharge: 2017-02-15 | Disposition: A | Discharge status: Home / Self Care | Payer: Medicare Other | Source: Ambulatory Visit | Attending: Internal Medicine | Admitting: Internal Medicine

## 2017-02-15 DIAGNOSIS — R112 Nausea with vomiting, unspecified: Secondary | ICD-10-CM | POA: Insufficient documentation

## 2017-02-15 MED ORDER — TECHNETIUM TC 99M MEBROFENIN IV KIT
5.0000 | PACK | Freq: Once | INTRAVENOUS | Status: AC | PRN
  Administered 2017-02-15: 5 via INTRAVENOUS

## 2017-02-15 MED ORDER — SODIUM CHLORIDE 0.9% FLUSH
INTRAVENOUS | Status: AC
  Filled 2017-02-15: qty 10

## 2017-02-21 ENCOUNTER — Telehealth (INDEPENDENT_AMBULATORY_CARE_PROVIDER_SITE_OTHER): Payer: Self-pay | Admitting: Internal Medicine

## 2017-02-21 NOTE — Telephone Encounter (Signed)
Patient called late Friday morning wanting Dr. Karilyn Cota to call her.  [phone]

## 2017-02-21 NOTE — Telephone Encounter (Signed)
I have talked with the patient. She states that she has been throwing up, nausea and explosive watery diarrhea. Afebrile. Patient states that she has lost over 30 pounds. She was advised per Dr. Karilyn Cota, if the Zofran and Phenergan are not working, she should go to the ED. Patient states that she does not want to got to the ED. She also says that the last test she had , she feels that it may have been incomplete. During the test she had bad watery diarrhea followed by N&V. Patient also states that she is having a hard time with the death of her Daddy.  Patient was told that this would be discussed with Dr.Rehman on 02/22/2017. She was okay with this.

## 2017-02-21 NOTE — Telephone Encounter (Signed)
Patient's spouse called, stated that since the patient's procedure she has been throwing up and unable to eat.

## 2017-02-23 NOTE — Telephone Encounter (Signed)
Discussed with Dr.Rehman on 02/22/2017. Forwarded to him for documentation once he has talked with her.

## 2017-02-28 ENCOUNTER — Encounter (INDEPENDENT_AMBULATORY_CARE_PROVIDER_SITE_OTHER): Payer: Self-pay | Admitting: Internal Medicine

## 2017-02-28 ENCOUNTER — Ambulatory Visit (INDEPENDENT_AMBULATORY_CARE_PROVIDER_SITE_OTHER): Payer: Self-pay | Admitting: Internal Medicine

## 2017-03-07 NOTE — Progress Notes (Signed)
The patient was given an appointment for 02/28/17 and did not show for her appointment.  A letter was sent.

## 2017-03-15 DIAGNOSIS — R1084 Generalized abdominal pain: Secondary | ICD-10-CM | POA: Diagnosis not present

## 2017-03-15 DIAGNOSIS — G500 Trigeminal neuralgia: Secondary | ICD-10-CM | POA: Diagnosis not present

## 2017-03-15 DIAGNOSIS — Z79899 Other long term (current) drug therapy: Secondary | ICD-10-CM | POA: Diagnosis not present

## 2017-03-15 DIAGNOSIS — G43711 Chronic migraine without aura, intractable, with status migrainosus: Secondary | ICD-10-CM | POA: Diagnosis not present

## 2017-03-15 DIAGNOSIS — F419 Anxiety disorder, unspecified: Secondary | ICD-10-CM | POA: Diagnosis not present

## 2017-03-19 ENCOUNTER — Ambulatory Visit (INDEPENDENT_AMBULATORY_CARE_PROVIDER_SITE_OTHER): Payer: Self-pay | Admitting: Otolaryngology

## 2017-03-21 ENCOUNTER — Ambulatory Visit (INDEPENDENT_AMBULATORY_CARE_PROVIDER_SITE_OTHER): Payer: Medicare Other | Admitting: Otolaryngology

## 2017-03-21 ENCOUNTER — Other Ambulatory Visit (INDEPENDENT_AMBULATORY_CARE_PROVIDER_SITE_OTHER): Payer: Self-pay | Admitting: Otolaryngology

## 2017-03-21 DIAGNOSIS — M542 Cervicalgia: Secondary | ICD-10-CM | POA: Diagnosis not present

## 2017-03-21 DIAGNOSIS — R1312 Dysphagia, oropharyngeal phase: Secondary | ICD-10-CM | POA: Diagnosis not present

## 2017-03-21 DIAGNOSIS — J310 Chronic rhinitis: Secondary | ICD-10-CM

## 2017-03-21 DIAGNOSIS — M9903 Segmental and somatic dysfunction of lumbar region: Secondary | ICD-10-CM | POA: Diagnosis not present

## 2017-03-21 DIAGNOSIS — I10 Essential (primary) hypertension: Secondary | ICD-10-CM | POA: Diagnosis not present

## 2017-03-21 DIAGNOSIS — M9902 Segmental and somatic dysfunction of thoracic region: Secondary | ICD-10-CM | POA: Diagnosis not present

## 2017-03-21 DIAGNOSIS — M546 Pain in thoracic spine: Secondary | ICD-10-CM | POA: Diagnosis not present

## 2017-03-21 DIAGNOSIS — G501 Atypical facial pain: Secondary | ICD-10-CM

## 2017-03-21 DIAGNOSIS — R1310 Dysphagia, unspecified: Secondary | ICD-10-CM

## 2017-03-21 DIAGNOSIS — M9901 Segmental and somatic dysfunction of cervical region: Secondary | ICD-10-CM | POA: Diagnosis not present

## 2017-03-21 DIAGNOSIS — M545 Low back pain: Secondary | ICD-10-CM | POA: Diagnosis not present

## 2017-03-25 DIAGNOSIS — M542 Cervicalgia: Secondary | ICD-10-CM | POA: Diagnosis not present

## 2017-03-25 DIAGNOSIS — M545 Low back pain: Secondary | ICD-10-CM | POA: Diagnosis not present

## 2017-03-25 DIAGNOSIS — M546 Pain in thoracic spine: Secondary | ICD-10-CM | POA: Diagnosis not present

## 2017-03-25 DIAGNOSIS — M9903 Segmental and somatic dysfunction of lumbar region: Secondary | ICD-10-CM | POA: Diagnosis not present

## 2017-03-25 DIAGNOSIS — M9901 Segmental and somatic dysfunction of cervical region: Secondary | ICD-10-CM | POA: Diagnosis not present

## 2017-03-25 DIAGNOSIS — I10 Essential (primary) hypertension: Secondary | ICD-10-CM | POA: Diagnosis not present

## 2017-03-25 DIAGNOSIS — M9902 Segmental and somatic dysfunction of thoracic region: Secondary | ICD-10-CM | POA: Diagnosis not present

## 2017-03-29 ENCOUNTER — Ambulatory Visit (HOSPITAL_COMMUNITY)
Admission: RE | Admit: 2017-03-29 | Discharge: 2017-03-29 | Disposition: A | Discharge status: Home / Self Care | Payer: Medicare Other | Source: Ambulatory Visit | Attending: Otolaryngology | Admitting: Otolaryngology

## 2017-03-29 DIAGNOSIS — R1310 Dysphagia, unspecified: Secondary | ICD-10-CM | POA: Diagnosis not present

## 2017-09-27 ENCOUNTER — Encounter (HOSPITAL_COMMUNITY): Payer: Self-pay | Admitting: *Deleted

## 2017-09-27 ENCOUNTER — Observation Stay (HOSPITAL_COMMUNITY)
Admit: 2017-09-27 | Discharge: 2017-09-27 | Disposition: A | Discharge status: Home / Self Care | Payer: Medicare Other | Attending: Internal Medicine | Admitting: Internal Medicine

## 2017-09-27 ENCOUNTER — Inpatient Hospital Stay (HOSPITAL_COMMUNITY)
Admission: EM | Admit: 2017-09-27 | Discharge: 2017-10-03 | DRG: 308 | Discharge status: Against Medical Advice | Payer: Medicare Other | Attending: Internal Medicine | Admitting: Internal Medicine

## 2017-09-27 ENCOUNTER — Emergency Department (HOSPITAL_COMMUNITY): Payer: Medicare Other

## 2017-09-27 ENCOUNTER — Other Ambulatory Visit: Payer: Self-pay

## 2017-09-27 ENCOUNTER — Observation Stay (HOSPITAL_COMMUNITY): Payer: Medicare Other

## 2017-09-27 ENCOUNTER — Observation Stay (HOSPITAL_BASED_OUTPATIENT_CLINIC_OR_DEPARTMENT_OTHER): Payer: Medicare Other

## 2017-09-27 DIAGNOSIS — R001 Bradycardia, unspecified: Secondary | ICD-10-CM | POA: Diagnosis not present

## 2017-09-27 DIAGNOSIS — E871 Hypo-osmolality and hyponatremia: Secondary | ICD-10-CM | POA: Diagnosis present

## 2017-09-27 DIAGNOSIS — R569 Unspecified convulsions: Secondary | ICD-10-CM

## 2017-09-27 DIAGNOSIS — I361 Nonrheumatic tricuspid (valve) insufficiency: Secondary | ICD-10-CM

## 2017-09-27 DIAGNOSIS — S3991XA Unspecified injury of abdomen, initial encounter: Secondary | ICD-10-CM | POA: Diagnosis not present

## 2017-09-27 DIAGNOSIS — N390 Urinary tract infection, site not specified: Secondary | ICD-10-CM | POA: Diagnosis present

## 2017-09-27 DIAGNOSIS — G500 Trigeminal neuralgia: Secondary | ICD-10-CM | POA: Diagnosis not present

## 2017-09-27 DIAGNOSIS — I959 Hypotension, unspecified: Secondary | ICD-10-CM | POA: Diagnosis present

## 2017-09-27 DIAGNOSIS — R1032 Left lower quadrant pain: Secondary | ICD-10-CM

## 2017-09-27 DIAGNOSIS — K921 Melena: Secondary | ICD-10-CM | POA: Diagnosis present

## 2017-09-27 DIAGNOSIS — R55 Syncope and collapse: Secondary | ICD-10-CM

## 2017-09-27 DIAGNOSIS — I10 Essential (primary) hypertension: Secondary | ICD-10-CM | POA: Diagnosis present

## 2017-09-27 DIAGNOSIS — F410 Panic disorder [episodic paroxysmal anxiety] without agoraphobia: Secondary | ICD-10-CM | POA: Diagnosis present

## 2017-09-27 DIAGNOSIS — Z90710 Acquired absence of both cervix and uterus: Secondary | ICD-10-CM

## 2017-09-27 DIAGNOSIS — E785 Hyperlipidemia, unspecified: Secondary | ICD-10-CM | POA: Diagnosis present

## 2017-09-27 DIAGNOSIS — R413 Other amnesia: Secondary | ICD-10-CM | POA: Diagnosis present

## 2017-09-27 DIAGNOSIS — F4310 Post-traumatic stress disorder, unspecified: Secondary | ICD-10-CM | POA: Diagnosis present

## 2017-09-27 DIAGNOSIS — S0990XA Unspecified injury of head, initial encounter: Secondary | ICD-10-CM | POA: Diagnosis not present

## 2017-09-27 DIAGNOSIS — G92 Toxic encephalopathy: Secondary | ICD-10-CM | POA: Diagnosis not present

## 2017-09-27 DIAGNOSIS — R51 Headache: Secondary | ICD-10-CM | POA: Diagnosis not present

## 2017-09-27 DIAGNOSIS — Z8249 Family history of ischemic heart disease and other diseases of the circulatory system: Secondary | ICD-10-CM

## 2017-09-27 DIAGNOSIS — E119 Type 2 diabetes mellitus without complications: Secondary | ICD-10-CM | POA: Diagnosis present

## 2017-09-27 DIAGNOSIS — G40909 Epilepsy, unspecified, not intractable, without status epilepticus: Secondary | ICD-10-CM | POA: Diagnosis present

## 2017-09-27 DIAGNOSIS — S161XXA Strain of muscle, fascia and tendon at neck level, initial encounter: Secondary | ICD-10-CM | POA: Diagnosis present

## 2017-09-27 DIAGNOSIS — W19XXXA Unspecified fall, initial encounter: Secondary | ICD-10-CM | POA: Diagnosis present

## 2017-09-27 DIAGNOSIS — K5900 Constipation, unspecified: Secondary | ICD-10-CM | POA: Diagnosis present

## 2017-09-27 DIAGNOSIS — R296 Repeated falls: Secondary | ICD-10-CM | POA: Diagnosis present

## 2017-09-27 DIAGNOSIS — E869 Volume depletion, unspecified: Secondary | ICD-10-CM | POA: Diagnosis present

## 2017-09-27 DIAGNOSIS — Z888 Allergy status to other drugs, medicaments and biological substances status: Secondary | ICD-10-CM

## 2017-09-27 DIAGNOSIS — S199XXA Unspecified injury of neck, initial encounter: Secondary | ICD-10-CM | POA: Diagnosis not present

## 2017-09-27 DIAGNOSIS — I69398 Other sequelae of cerebral infarction: Secondary | ICD-10-CM

## 2017-09-27 DIAGNOSIS — Z79899 Other long term (current) drug therapy: Secondary | ICD-10-CM

## 2017-09-27 DIAGNOSIS — F419 Anxiety disorder, unspecified: Secondary | ICD-10-CM | POA: Diagnosis present

## 2017-09-27 LAB — URINALYSIS, ROUTINE W REFLEX MICROSCOPIC
Bilirubin Urine: NEGATIVE
Glucose, UA: NEGATIVE mg/dL
Hgb urine dipstick: NEGATIVE
KETONES UR: NEGATIVE mg/dL
Nitrite: NEGATIVE
PH: 8 (ref 5.0–8.0)
Protein, ur: NEGATIVE mg/dL
SPECIFIC GRAVITY, URINE: 1.004 — AB (ref 1.005–1.030)
TRANS EPITHEL UA: 1

## 2017-09-27 LAB — URINALYSIS, COMPLETE (UACMP) WITH MICROSCOPIC
BACTERIA UA: NONE SEEN
BILIRUBIN URINE: NEGATIVE
Glucose, UA: NEGATIVE mg/dL
HGB URINE DIPSTICK: NEGATIVE
Ketones, ur: NEGATIVE mg/dL
NITRITE: NEGATIVE
Protein, ur: NEGATIVE mg/dL
SPECIFIC GRAVITY, URINE: 1.004 — AB (ref 1.005–1.030)
Trans Epithel, UA: <1
pH: 8 (ref 5.0–8.0)

## 2017-09-27 LAB — DIFFERENTIAL
BASOS ABS: 0 10*3/uL (ref 0.0–0.1)
BASOS PCT: 0 %
EOS ABS: 0.2 10*3/uL (ref 0.0–0.7)
Eosinophils Relative: 2 %
LYMPHS ABS: 4.4 10*3/uL — AB (ref 0.7–4.0)
Lymphocytes Relative: 47 %
Monocytes Absolute: 0.5 10*3/uL (ref 0.1–1.0)
Monocytes Relative: 5 %
NEUTROS PCT: 46 %
Neutro Abs: 4.3 10*3/uL (ref 1.7–7.7)

## 2017-09-27 LAB — I-STAT CHEM 8, ED
BUN: 15 mg/dL (ref 6–20)
CALCIUM ION: 1.24 mmol/L (ref 1.15–1.40)
CHLORIDE: 97 mmol/L — AB (ref 101–111)
CREATININE: 1.1 mg/dL — AB (ref 0.44–1.00)
Glucose, Bld: 110 mg/dL — ABNORMAL HIGH (ref 65–99)
HEMATOCRIT: 44 % (ref 36.0–46.0)
Hemoglobin: 15 g/dL (ref 12.0–15.0)
Potassium: 4.2 mmol/L (ref 3.5–5.1)
Sodium: 136 mmol/L (ref 135–145)
TCO2: 29 mmol/L (ref 22–32)

## 2017-09-27 LAB — COMPREHENSIVE METABOLIC PANEL
ALBUMIN: 4 g/dL (ref 3.5–5.0)
ALT: 16 U/L (ref 14–54)
AST: 26 U/L (ref 15–41)
Alkaline Phosphatase: 34 U/L — ABNORMAL LOW (ref 38–126)
Anion gap: 8 (ref 5–15)
BUN: 15 mg/dL (ref 6–20)
CHLORIDE: 98 mmol/L — AB (ref 101–111)
CO2: 28 mmol/L (ref 22–32)
Calcium: 10.1 mg/dL (ref 8.9–10.3)
Creatinine, Ser: 1.1 mg/dL — ABNORMAL HIGH (ref 0.44–1.00)
GFR calc Af Amer: >60 mL/min (ref >60)
GFR calc non Af Amer: 59 mL/min — ABNORMAL LOW (ref >60)
GLUCOSE: 115 mg/dL — AB (ref 65–99)
POTASSIUM: 4.5 mmol/L (ref 3.5–5.1)
SODIUM: 134 mmol/L — AB (ref 135–145)
Total Bilirubin: 0.7 mg/dL (ref 0.3–1.2)
Total Protein: 7.9 g/dL (ref 6.5–8.1)

## 2017-09-27 LAB — RAPID URINE DRUG SCREEN, HOSP PERFORMED
Amphetamines: NOT DETECTED
BARBITURATES: NOT DETECTED
BENZODIAZEPINES: POSITIVE — AB
COCAINE: NOT DETECTED
Opiates: NOT DETECTED
TETRAHYDROCANNABINOL: NOT DETECTED

## 2017-09-27 LAB — CBC
HCT: 43.1 % (ref 36.0–46.0)
Hemoglobin: 14.4 g/dL (ref 12.0–15.0)
MCH: 31.8 pg (ref 26.0–34.0)
MCHC: 33.4 g/dL (ref 30.0–36.0)
MCV: 95.1 fL (ref 78.0–100.0)
Platelets: 360 10*3/uL (ref 150–400)
RBC: 4.53 MIL/uL (ref 3.87–5.11)
RDW: 13 % (ref 11.5–15.5)
WBC: 9.5 10*3/uL (ref 4.0–10.5)

## 2017-09-27 LAB — PROTIME-INR
INR: 0.93
PROTHROMBIN TIME: 12.4 s (ref 11.4–15.2)

## 2017-09-27 LAB — GLUCOSE, CAPILLARY
GLUCOSE-CAPILLARY: 102 mg/dL — AB (ref 65–99)
GLUCOSE-CAPILLARY: 104 mg/dL — AB (ref 65–99)
GLUCOSE-CAPILLARY: 111 mg/dL — AB (ref 65–99)
Glucose-Capillary: 101 mg/dL — ABNORMAL HIGH (ref 65–99)

## 2017-09-27 LAB — I-STAT TROPONIN, ED: Troponin i, poc: 0 ng/mL (ref 0.00–0.08)

## 2017-09-27 LAB — I-STAT BETA HCG BLOOD, ED (MC, WL, AP ONLY): I-stat hCG, quantitative: 10.9 m[IU]/mL — ABNORMAL HIGH (ref <5)

## 2017-09-27 LAB — MRSA PCR SCREENING: MRSA by PCR: NEGATIVE

## 2017-09-27 LAB — LACTIC ACID, PLASMA: Lactic Acid, Venous: 1.4 mmol/L (ref 0.5–1.9)

## 2017-09-27 LAB — LIPASE, BLOOD: LIPASE: 23 U/L (ref 11–51)

## 2017-09-27 LAB — ECHOCARDIOGRAM COMPLETE
Height: 62 in
WEIGHTICAEL: 2338.64 [oz_av]

## 2017-09-27 LAB — T4, FREE: Free T4: 0.68 ng/dL (ref 0.61–1.12)

## 2017-09-27 LAB — HCG, QUANTITATIVE, PREGNANCY: HCG, BETA CHAIN, QUANT, S: 6 m[IU]/mL — AB (ref <5)

## 2017-09-27 LAB — ETHANOL

## 2017-09-27 LAB — MAGNESIUM: MAGNESIUM: 2.1 mg/dL (ref 1.7–2.4)

## 2017-09-27 LAB — TSH: TSH: 2.849 u[IU]/mL (ref 0.350–4.500)

## 2017-09-27 LAB — APTT: aPTT: 28 seconds (ref 24–36)

## 2017-09-27 LAB — CK: CK TOTAL: 33 U/L — AB (ref 38–234)

## 2017-09-27 MED ORDER — ATROPINE SULFATE 1 MG/10ML IJ SOSY
0.5000 mg | PREFILLED_SYRINGE | INTRAMUSCULAR | Status: DC | PRN
  Administered 2017-09-27 – 2017-09-29 (×5): 0.5 mg via INTRAVENOUS
  Filled 2017-09-27 (×4): qty 10

## 2017-09-27 MED ORDER — ATROPINE SULFATE 1 MG/ML IJ SOLN
INTRAMUSCULAR | Status: AC
  Filled 2017-09-27: qty 1

## 2017-09-27 MED ORDER — ATROPINE SULFATE 1 MG/10ML IJ SOSY
1.0000 mg | PREFILLED_SYRINGE | Freq: Once | INTRAMUSCULAR | Status: AC
  Administered 2017-09-27: 1 mg via INTRAVENOUS
  Filled 2017-09-27: qty 10

## 2017-09-27 MED ORDER — DIAZEPAM 5 MG PO TABS: 5.0000 mg | ORAL_TABLET | Freq: Two times a day (BID) | ORAL | Status: DC | PRN

## 2017-09-27 MED ORDER — GABAPENTIN 400 MG PO CAPS
400.0000 mg | ORAL_CAPSULE | Freq: Three times a day (TID) | ORAL | Status: DC
  Administered 2017-09-27 – 2017-10-02 (×18): 400 mg via ORAL
  Filled 2017-09-27: qty 4
  Filled 2017-09-27: qty 1
  Filled 2017-09-27: qty 4
  Filled 2017-09-27 (×2): qty 1
  Filled 2017-09-27: qty 4
  Filled 2017-09-27 (×4): qty 1
  Filled 2017-09-27: qty 4
  Filled 2017-09-27 (×3): qty 1
  Filled 2017-09-27: qty 4
  Filled 2017-09-27 (×9): qty 1

## 2017-09-27 MED ORDER — SODIUM CHLORIDE 0.9 % IV BOLUS (SEPSIS)
1000.0000 mL | Freq: Once | INTRAVENOUS | Status: AC
  Administered 2017-09-27: 1000 mL via INTRAVENOUS

## 2017-09-27 MED ORDER — LEVETIRACETAM IN NACL 1500 MG/100ML IV SOLN
1500.0000 mg | Freq: Once | INTRAVENOUS | Status: AC
  Administered 2017-09-27: 1500 mg via INTRAVENOUS
  Filled 2017-09-27: qty 100

## 2017-09-27 MED ORDER — VITAMIN D 1000 UNITS PO TABS
1000.0000 [IU] | ORAL_TABLET | Freq: Four times a day (QID) | ORAL | Status: DC
  Administered 2017-09-27 – 2017-10-02 (×24): 1000 [IU] via ORAL
  Filled 2017-09-27 (×24): qty 1

## 2017-09-27 MED ORDER — HYDROCORTISONE ACETATE 25 MG RE SUPP
25.0000 mg | Freq: Two times a day (BID) | RECTAL | Status: DC
  Administered 2017-09-27 – 2017-10-02 (×11): 25 mg via RECTAL
  Filled 2017-09-27 (×14): qty 1

## 2017-09-27 MED ORDER — ONDANSETRON HCL 4 MG/2ML IJ SOLN
4.0000 mg | Freq: Four times a day (QID) | INTRAMUSCULAR | Status: DC | PRN
  Administered 2017-09-27 – 2017-10-03 (×5): 4 mg via INTRAVENOUS
  Filled 2017-09-27 (×5): qty 2

## 2017-09-27 MED ORDER — MAGNESIUM SULFATE 2 GM/50ML IV SOLN
2.0000 g | Freq: Once | INTRAVENOUS | Status: AC
  Administered 2017-09-27: 2 g via INTRAVENOUS
  Filled 2017-09-27: qty 50

## 2017-09-27 MED ORDER — PIOGLITAZONE HCL 15 MG PO TABS
15.0000 mg | ORAL_TABLET | Freq: Every day | ORAL | Status: DC
  Filled 2017-09-27 (×2): qty 1

## 2017-09-27 MED ORDER — ATORVASTATIN CALCIUM 40 MG PO TABS: 80.0000 mg | ORAL_TABLET | Freq: Every day | ORAL | Status: DC

## 2017-09-27 MED ORDER — DEXTROSE 5 % IV SOLN
1.0000 g | INTRAVENOUS | Status: DC
  Filled 2017-09-27: qty 10

## 2017-09-27 MED ORDER — POLYVINYL ALCOHOL 1.4 % OP SOLN
1.0000 [drp] | Freq: Every day | OPHTHALMIC | Status: DC
  Administered 2017-09-27: 1 [drp] via OPHTHALMIC
  Administered 2017-09-28 – 2017-10-01 (×4): 2 [drp] via OPHTHALMIC
  Administered 2017-10-02: 1 [drp] via OPHTHALMIC
  Filled 2017-09-27 (×2): qty 15

## 2017-09-27 MED ORDER — GADOBENATE DIMEGLUMINE 529 MG/ML IV SOLN
10.0000 mL | Freq: Once | INTRAVENOUS | Status: AC | PRN
  Administered 2017-09-27: 10 mL via INTRAVENOUS

## 2017-09-27 MED ORDER — MAGNESIUM HYDROXIDE 400 MG/5ML PO SUSP
15.0000 mL | Freq: Every day | ORAL | Status: DC | PRN
  Administered 2017-10-03: 15 mL via ORAL
  Filled 2017-09-27: qty 30

## 2017-09-27 MED ORDER — ATROPINE SULFATE 1 MG/10ML IJ SOSY
0.5000 mg | PREFILLED_SYRINGE | Freq: Once | INTRAMUSCULAR | Status: AC
  Administered 2017-09-27: 0.5 mg via INTRAVENOUS

## 2017-09-27 MED ORDER — LEVETIRACETAM 750 MG PO TABS
1500.0000 mg | ORAL_TABLET | Freq: Two times a day (BID) | ORAL | Status: DC
  Administered 2017-09-27 – 2017-10-02 (×12): 1500 mg via ORAL
  Filled 2017-09-27: qty 3
  Filled 2017-09-27 (×2): qty 2
  Filled 2017-09-27: qty 3
  Filled 2017-09-27: qty 2
  Filled 2017-09-27 (×4): qty 3
  Filled 2017-09-27: qty 2
  Filled 2017-09-27: qty 3
  Filled 2017-09-27: qty 2

## 2017-09-27 MED ORDER — MIRTAZAPINE 30 MG PO TABS: 30.0000 mg | ORAL_TABLET | Freq: Every day | ORAL | Status: DC

## 2017-09-27 MED ORDER — IOPAMIDOL (ISOVUE-300) INJECTION 61%
100.0000 mL | Freq: Once | INTRAVENOUS | Status: AC | PRN
  Administered 2017-09-27: 100 mL via INTRAVENOUS

## 2017-09-27 MED ORDER — SODIUM CHLORIDE 0.9 % IV SOLN
INTRAVENOUS | Status: DC
  Administered 2017-09-27 – 2017-09-28 (×4): via INTRAVENOUS

## 2017-09-27 MED ORDER — VENLAFAXINE HCL ER 75 MG PO CP24
150.0000 mg | ORAL_CAPSULE | Freq: Every day | ORAL | Status: DC
  Administered 2017-09-27 – 2017-10-02 (×6): 150 mg via ORAL
  Filled 2017-09-27 (×6): qty 2

## 2017-09-27 MED ORDER — CITALOPRAM HYDROBROMIDE 20 MG PO TABS
40.0000 mg | ORAL_TABLET | Freq: Every day | ORAL | Status: DC
  Administered 2017-09-27 – 2017-10-02 (×6): 40 mg via ORAL
  Filled 2017-09-27 (×6): qty 2

## 2017-09-27 NOTE — Progress Notes (Signed)
Voicemail left with Rayford HalstedHeaven Potter at [facility] office about new Neurology consult.

## 2017-09-27 NOTE — Progress Notes (Signed)
HR 34, nurse made aware. 0.5 mg atropine administered. HR increased to 56. Pt drowsy and slow to awaken.  Dr. Arbutus Leasat paged.

## 2017-09-27 NOTE — Progress Notes (Signed)
EEG Completed; Results Pending  

## 2017-09-27 NOTE — Care Management Note (Addendum)
Case Management Note  Patient Details  Name: Mariah Ibarra MRN: [id number] Date of Birth: 12/21/1970  Subjective/Objective:  Adm with symptomatic bradycardia, possible seizures. Patient is from home with significant other, usually ind with ADL's. Sleeping, does not wake up, significant other offered information. Reports she is a [employer]eteran and [facility] or [facility]. She also has Medicare.                   Action/Plan: CM following, Anticipate DC home with self care. Cardiology and neurology consulted.   ADDENDUM: 09/28/2017 1400: Spoke with patient today. She reports [facility], but [facility] and is in process of switching [facility].  [facility] to notify [facility] of patient's admission. Left voicemail. [facility].    Expected Discharge Date:    unk               Expected Discharge Plan:     In-House Referral:     Discharge planning Services  CM Consult  Post Acute Care Choice:    Choice offered to:     DME Arranged:    DME Agency:     HH Arranged:    HH Agency:     Status of Service:  In process, will continue to follow  If discussed at Long Length of Stay Meetings, dates discussed:    Additional Comments:  Shaya Reddick, Chrystine OilerSharley Diane, RN 09/27/2017, 2:15 PM

## 2017-09-27 NOTE — Progress Notes (Signed)
*  PRELIMINARY RESULTS* Echocardiogram 2D Echocardiogram has been performed.  Stacey DrainWhite, Dorothea Yow J 09/27/2017, 10:36 AM

## 2017-09-27 NOTE — ED Triage Notes (Signed)
Pt c/o falling a lot today and states she has been having multiple mini seizures today; pt also c/o right eye pain and pain to forehead after falling and striking her head on the floor

## 2017-09-27 NOTE — Progress Notes (Signed)
[facility] Spoke to Mariah Ibarra about neurology consult

## 2017-09-27 NOTE — ED Provider Notes (Signed)
[facility] EMERGENCY DEPARTMENT Provider Note   CSN: [id number] Arrival date & time: 09/27/17  0225     History   Chief Complaint Chief Complaint  Patient presents with  . Seizures  Level 5 caveat due to acuity of condition  HPI Mariah Ibarra is a 46 y.o. female.  The history is provided by the patient and the spouse. The history is limited by the condition of the patient.  Seizures   This is a recurrent problem. The current episode started more than 2 days ago. The problem has been gradually worsening. There has been no fever.   Patient with history of seizures reported history of stroke as well as anxiety, presents with episodes of seizures over the past several days The patient reports she has been having episodes of blacking out, and her significant other reports that she has had some mild seizure activity Just prior to arrival she fell and hit her head, she now is reporting headache and neck pain She reports taking medications as prescribed New medications are reported  She reports feeling too weak to walk  She also reports over the past week she has been having visual change She reports that her right eye she has been having some blurred vision These visual changes proceeded the actual seizures  No further history taken due to acuity of situation Past Medical History:  Diagnosis Date  . Anxiety   . Hypercholesteremia   . Hypertension   . Panic attacks   . PONV (postoperative nausea and vomiting)   . PTSD (post-traumatic stress disorder)   . Seizures (HCC)    2 months since last seizure.  . Stroke [facility])    short term memory loss from 3rd brain surgery.  . Trigeminal neuralgia     Patient Active Problem List   Diagnosis Date Noted  . Loss of weight 12/23/2016  . Nausea without vomiting 12/23/2016  . Lisfranc dislocation, right, initial encounter 11/04/2016  . Fracture of ankle, trimalleolar, right, closed   . Thrush 12/03/2012  . Otitis externa, left  12/03/2012  . SIADH (syndrome of inappropriate ADH production) (HCC) 12/02/2012  . Hyponatremia 12/01/2012  . Seizure (HCC) 12/01/2012  . High anion gap metabolic acidosis 12/01/2012  . Hypokalemia 12/01/2012  . Depression 12/01/2012  . Anxiety 12/01/2012  . Ataxia 08/24/2012  . Trigeminal neuralgia 08/24/2012  . Hypertension 08/24/2012  . Hyperlipidemia 08/24/2012    Past Surgical History:  Procedure Laterality Date  . ABDOMINAL HYSTERECTOMY    . ABDOMINOPLASTY    . APPENDECTOMY    . bladder tack    . BRAIN SURGERY     x4  . CERVICAL ABLATION    . ESOPHAGOGASTRODUODENOSCOPY N/A 01/13/2017   Procedure: ESOPHAGOGASTRODUODENOSCOPY (EGD);  Surgeon: Malissa Hippo, MD;  Location: [facility] ENDO SUITE;  Service: Endoscopy;  Laterality: N/A;  3:00  . Gamma knife Procedure November 2013    . ORIF ANKLE FRACTURE Right 06/03/2016   Procedure: OPEN REDUCTION INTERNAL FIXATION (ORIF) RIGHT ANKLE;  Surgeon: Vickki Hearing, MD;  Location: [facility] ORS;  Service: Orthopedics;  Laterality: Right;  . TONSILLECTOMY      OB History    Gravida Para Term Preterm AB Living   0 0 0 0 0     SAB TAB Ectopic Multiple Live Births   0 0 0           Home Medications    Prior to Admission medications   Medication Sig Start Date End Date Taking? Authorizing Provider  atorvastatin (LIPITOR) 80 MG tablet Take 80 mg by mouth daily.   Yes [provider]  cholecalciferol (VITAMIN D) 1000 units tablet Take 1,000 Units by mouth 4 (four) times daily.   Yes [provider]  citalopram (CELEXA) 40 MG tablet Take 40 mg by mouth daily.   Yes [provider]  diazepam (VALIUM) 5 MG tablet Take 5 mg by mouth every 12 (twelve) hours as needed for anxiety.   Yes [provider]  gabapentin (NEURONTIN) 600 MG tablet Take 400 mg by mouth 3 (three) times daily.    Yes [provider]  HYDROmorphone (DILAUDID) 4 MG tablet Take 4 mg by mouth every 6 (six) hours as needed for severe  pain.   Yes [provider]  levETIRAcetam (KEPPRA) 750 MG tablet Take 1,500 mg by mouth 2 (two) times daily.   Yes [provider]  lidocaine (XYLOCAINE) 2 % jelly Apply 1 application topically as needed (pain).    Yes [provider]  lisinopril-hydrochlorothiazide (PRINZIDE,ZESTORETIC) 20-25 MG tablet Take 1 tablet by mouth daily.   Yes [provider]  pioglitazone (ACTOS) 15 MG tablet Take 15 mg by mouth daily.   Yes [provider]  promethazine (PHENERGAN) 25 MG suppository Place 25 mg rectally every 6 (six) hours as needed for nausea or vomiting.   Yes [provider]  venlafaxine XR (EFFEXOR-XR) 150 MG 24 hr capsule Take 150 mg by mouth daily with breakfast.   Yes [provider]  ibuprofen (ADVIL,MOTRIN) 400 MG tablet Take 400 mg by mouth every 6 (six) hours as needed for headache or moderate pain.    [provider]  lisinopril (PRINIVIL) 20 MG tablet Take 1 tablet (20 mg total) by mouth daily. 12/03/12   Christiane HaSullivan, Corinna L, MD  magnesium hydroxide (MILK OF MAGNESIA) 400 MG/5ML suspension Take 15 mLs by mouth daily as needed for mild constipation.    [provider]  magnesium oxide (MAG-OX) 400 MG tablet Take 400 mg by mouth daily.    [provider]  mirtazapine (REMERON) 15 MG tablet Take 30 mg by mouth at bedtime.     [provider]  ondansetron (ZOFRAN ODT) 4 MG disintegrating tablet Take 1 tablet (4 mg total) by mouth every 8 (eight) hours as needed for nausea or vomiting. Patient taking differently: Take 4 mg by mouth every 6 (six) hours as needed for nausea or vomiting.  12/29/13   Gerhard MunchLockwood, Robert, MD  pantoprazole (PROTONIX) 40 MG tablet Take 1 tablet (40 mg total) by mouth daily before breakfast. 01/13/17   Rehman, Joline MaxcyNajeeb U, MD  polyvinyl alcohol-povidone (REFRESH) 1.4-0.6 % ophthalmic solution Apply 1-2 drops to eye daily.     [provider]  tiZANidine (ZANAFLEX) 4 MG  tablet Take 4 mg by mouth 2 (two) times daily.     [provider]  zolpidem (AMBIEN) 10 MG tablet Take 10 mg by mouth at bedtime as needed for sleep.    [provider]    Family History Family History  Problem Relation Age of Onset  . Depression Paternal Aunt   . Depression Paternal Uncle   . Depression Maternal Grandfather     Social History Social History   Tobacco Use  . Smoking status: Never Smoker  . Smokeless tobacco: Never Used  Substance Use Topics  . Alcohol use: No    Comment: occ  . Drug use: No     Allergies   Baclofen and Lorazepam   Review  of Systems Review of Systems  Unable to perform ROS: Acuity of condition  Neurological: Positive for seizures.     Physical Exam Updated Vital Signs BP 102/74 (BP Location: Left Arm)   Pulse (!) 38   Resp 12   Ht 1.575 m (5\' 2" )   Wt 63.5 kg (140 lb)   LMP 11/01/2013   SpO2 100%   BMI 25.61 kg/m   Physical Exam CONSTITUTIONAL: Somnolent and ill-appearing HEAD: Normocephalic/atraumatic, no visible signs of trauma EYES: EOMI/PERRL, no proptosis ?  Mild ptosis right eye, no nystagmus, no corneal hazing, no visible signs of trauma ENMT: Mucous membranes moist, no tongue lacerations NECK: supple no meningeal signs SPINE/BACK: Mild cervical spine tenderness, no other spinal tenderness, No bruising/crepitance/stepoffs noted to spine CV: S1/S2 noted, bradycardic LUNGS: Lungs are clear to auscultation bilaterally, no apparent distress ABDOMEN: soft, nontender, no rebound or guarding, bowel sounds noted throughout abdomen GU:no cva tenderness NEURO: Pt is awake/alert, moves all extremitiesx4.  No facial droop.   No arm or leg drift.  Patient appears mildly sedated but is very conversant EXTREMITIES: pulses normal/equal, full ROM, no visible signs of trauma no deformities noted SKIN: warm, color normal PSYCH: Unable to assess  ED Treatments / Results  Labs (all labs ordered are listed, but  only abnormal results are displayed) Labs Reviewed  DIFFERENTIAL - Abnormal; Notable for the following components:      Result Value   Lymphs Abs 4.4 (*)    All other components within normal limits  COMPREHENSIVE METABOLIC PANEL - Abnormal; Notable for the following components:   Sodium 134 (*)    Chloride 98 (*)    Glucose, Bld 115 (*)    Creatinine, Ser 1.10 (*)    Alkaline Phosphatase 34 (*)    GFR calc non Af Amer 59 (*)    All other components within normal limits  RAPID URINE DRUG SCREEN, HOSP PERFORMED - Abnormal; Notable for the following components:   Benzodiazepines POSITIVE (*)    All other components within normal limits  URINALYSIS, ROUTINE W REFLEX MICROSCOPIC - Abnormal; Notable for the following components:   APPearance HAZY (*)    Specific Gravity, Urine 1.004 (*)    Leukocytes, UA MODERATE (*)    Bacteria, UA RARE (*)    Squamous Epithelial / LPF 6-30 (*)    All other components within normal limits  I-STAT CHEM 8, ED - Abnormal; Notable for the following components:   Chloride 97 (*)    Creatinine, Ser 1.10 (*)    Glucose, Bld 110 (*)    All other components within normal limits  I-STAT BETA HCG BLOOD, ED ([facility], [facility], [facility] ONLY) - Abnormal; Notable for the following components:   I-stat hCG, quantitative 10.9 (*)    All other components within normal limits  ETHANOL  PROTIME-INR  APTT  CBC  I-STAT TROPONIN, ED    EKG  EKG Interpretation  Date/Time:  Tuesday September 27 2017 02:39:06 EST Ventricular Rate:  38 PR Interval:    QRS Duration: 97 QT Interval:  565 QTC Calculation: 450 R Axis:   78 Text Interpretation:  Sinus bradycardia Short PR interval Abnormal ekg Confirmed by Zadie Rhine ([id number]) on 09/27/2017 2:51:41 AM       EKG Interpretation  Date/Time:  Tuesday September 27 2017 03:04:59 EST Ventricular Rate:  67 PR Interval:    QRS Duration: 84 QT Interval:  477 QTC Calculation: 504 R Axis:   58 Text Interpretation:  Sinus rhythm  Borderline prolonged  QT interval HR is now improved on this EKG Confirmed by Zadie RhineWickline, Maxmilian Trostel ([id number]) on 09/27/2017 3:11:34 AM        Radiology Ct Head Wo Contrast  Result Date: 09/27/2017 CLINICAL DATA:  Seizure and fall EXAM: CT HEAD WITHOUT CONTRAST CT CERVICAL SPINE WITHOUT CONTRAST TECHNIQUE: Multidetector CT imaging of the head and cervical spine was performed following the standard protocol without intravenous contrast. Multiplanar CT image reconstructions of the cervical spine were also generated. COMPARISON:  11/02/2016 FINDINGS: CT HEAD FINDINGS Brain: No acute territorial infarction, hemorrhage or intracranial mass is visualized. Small amount of encephalomalacia in the left cerebellum nonenlarged ventricles. Vascular: No hyperdense vessels.  No unexpected calcification Skull: No fracture. Left occipital craniectomy with hyperdense material and metallic plate as before. Sinuses/Orbits: Mild mucosal thickening in the ethmoid and sphenoid sinuses. No acute orbital abnormality Other: None CT CERVICAL SPINE FINDINGS Alignment: Straightening of the cervical spine. No subluxation. Facet alignment within normal limits Skull base and vertebrae: No acute fracture. No primary bone lesion or focal pathologic process. Soft tissues and spinal canal: No prevertebral fluid or swelling. No visible canal hematoma. Disc levels:  Mild degenerative changes at C5-C6 and C6-C7 Upper chest: Negative. Other: None IMPRESSION: 1. No CT evidence for acute intracranial abnormality. Stable left occipital postsurgical changes 2. Straightening of the cervical spine.  No acute fracture seen Electronically Signed   By: Jasmine PangKim  Fujinaga M.D.   On: 09/27/2017 03:51   Ct Cervical Spine Wo Contrast  Result Date: 09/27/2017 CLINICAL DATA:  Seizure and fall EXAM: CT HEAD WITHOUT CONTRAST CT CERVICAL SPINE WITHOUT CONTRAST TECHNIQUE: Multidetector CT imaging of the head and cervical spine was performed following the standard protocol  without intravenous contrast. Multiplanar CT image reconstructions of the cervical spine were also generated. COMPARISON:  11/02/2016 FINDINGS: CT HEAD FINDINGS Brain: No acute territorial infarction, hemorrhage or intracranial mass is visualized. Small amount of encephalomalacia in the left cerebellum nonenlarged ventricles. Vascular: No hyperdense vessels.  No unexpected calcification Skull: No fracture. Left occipital craniectomy with hyperdense material and metallic plate as before. Sinuses/Orbits: Mild mucosal thickening in the ethmoid and sphenoid sinuses. No acute orbital abnormality Other: None CT CERVICAL SPINE FINDINGS Alignment: Straightening of the cervical spine. No subluxation. Facet alignment within normal limits Skull base and vertebrae: No acute fracture. No primary bone lesion or focal pathologic process. Soft tissues and spinal canal: No prevertebral fluid or swelling. No visible canal hematoma. Disc levels:  Mild degenerative changes at C5-C6 and C6-C7 Upper chest: Negative. Other: None IMPRESSION: 1. No CT evidence for acute intracranial abnormality. Stable left occipital postsurgical changes 2. Straightening of the cervical spine.  No acute fracture seen Electronically Signed   By: Jasmine PangKim  Fujinaga M.D.   On: 09/27/2017 03:51    Procedures Procedures  CRITICAL CARE Performed by: Joya Gaskinsonald W Kadeidra Coryell Total critical care time: 40 minutes Critical care time was exclusive of separately billable procedures and treating other patients. Critical care was necessary to treat or prevent imminent or life-threatening deterioration. Critical care was time spent personally by me on the following activities: development of treatment plan with patient and/or surrogate as well as nursing, discussions with consultants, evaluation of patient's response to treatment, examination of patient, obtaining history from patient or surrogate, ordering and performing treatments and interventions, ordering and review of  laboratory studies, ordering and review of radiographic studies, pulse oximetry and re-evaluation of patient's condition. Patient with episode of bradycardia in the 30s that required atropine She required multiple re-evaluations while in the  emergency department Patient improved with atropine  Medications Ordered in ED Medications  atropine 1 MG/10ML injection 1 mg (1 mg Intravenous Given 09/27/17 0255)  sodium chloride 0.9 % bolus 1,000 mL (1,000 mLs Intravenous New Bag/Given 09/27/17 0445)     Initial Impression / Assessment and Plan / ED Course  I have reviewed the triage vital signs and the nursing notes.  Pertinent labs & imaging results that were available during my care of the patient were reviewed by me and considered in my medical decision making (see chart for details).     3:12 AM Patient seen soon after arrival to the emergency department She was noted to be bradycardic but with a normal blood pressure She was somnolent but easily arousable Unclear cause of bradycardia Her potassium was normal She had a very good response to atropine, heart rate now in the 60s Next step will be CT imaging of head and neck, will follow closely 3:29 AM Per care everywhere records, patient is status post gamma knife for left trigeminal neuralgia, status post microvascular decompression in 2009.  Patient also has a history of seizures BP (!) 133/102   Pulse 65   Temp (!) 97.2 F (36.2 C) (Rectal)   Resp 19   Ht 1.575 m (5\' 2" )   Wt 63.5 kg (140 lb)   LMP 11/01/2013   SpO2 99%   BMI 25.61 kg/m  Currently awaiting neuroimaging at this time 4:54 AM CT imaging is negative Patient sleeping but easily arousable Rate has remained consistently above 50 Unclear cause of bradycardia however is improving and could be related to underlying medication  Due to increasing episodes of syncope versus seizure as well as bradycardia will admit Unclear cause of visual changes over the past  week Patient may benefit from an MRI as an outpatient  4:55 AM Discussed with Dr. Robb Matar for admission to the hospital  Final Clinical Impressions(s) / ED Diagnoses   Final diagnoses:  Bradycardia  Seizure-like activity (HCC)  Syncope and collapse  Acute cervical myofascial strain, initial encounter    ED Discharge Orders    None       Zadie Rhine, MD 09/27/17 [phone]

## 2017-09-27 NOTE — Progress Notes (Signed)
Witnessed seizure like activity 2 minutes in duration also witnessed by [facility]. Hospitalist notified and responded. Orders in process.

## 2017-09-27 NOTE — H&P (Addendum)
History and Physical    Mariah Ibarra [facility] RUE:[id number] DOB: 1971-05-28 DOA: 09/27/2017  PCP: Frederich Chick., MD   Patient coming from: Home.  I have personally briefly reviewed patient's old medical records in [facility]  Chief Complaint: Falls.  HPI: Mariah Ibarra is a 46 y.o. female with medical history significant of anxiety, panic attacks, PTSD, hyperlipidemia, hypertension, type 2 diabetes, seizures, history of stroke and short-term memory loss from her brain surgery, trigeminal neuralgia who is coming to the emergency department with complaints of multiple falls in the day, episodes of blackout and seizures associated with blurred vision and headache after she fell hitting her head on the floor.  She and her significant other, Marolyn Hammock, stated that the patient began feeling sick on Wednesday, when she developed nausea with multiple episodes of emesis. She also has some episodes of emesis over the weekend and vomited all her medications, including her Keppra tablets.  The last time she took Keppra was 2 days ago and threw them u.p.   She has been constipated since Wednesday or Thursday.  She had an episode of hematochezia over the weekend.  This continued through Thanksgiving Thursday and the patient has been taken 100 mg of Dramamine 4 times a day to treat nausea and dizziness.  Since then, she has had multiple falls, most of them have been "blackouts", but some of them have been seizures.  She complains of headache, blurred vision (particularly the right eye) and seeing bubbles.  Her appetite and oral fluid intake has been decreased.  Her significant other states that she has been very lethargic.  She denies fever, chills, sore throat, shortness of breath, chest pain, palpitations, diaphoresis or pitting edema of the lower extremities.  She denies abdominal pain, diarrhea or melena.  She denies dysuria, frequency or hematuria.  No polyuria no polydipsia.  ED  Course: Initial vital signs temperature 36.30F, pulse 38, respirations 12, blood pressure 102/74 mmHg and O2 sat 100% on room air.  She received atropine 1 mg IVP and a 1000 mL NS bolus in the emergency department.  Her workup shows an EKG that was sinus rhythm with borderline prolonged QT interval.  However, she has been bradycardic most of her stay.  Troponin was normal. Urinalysis shows pyuria of 6-30 WBC per HPF.  Urine toxicology showed benzodiazepines, for which she has a prescription. iSTAT hCG was 10.9.  PT/INR/PTT was normal.  CBC was normal.  Her CMP showed a sodium of 134, potassium 4.5, chloride 98, CO2 28 millimol/L.  Glucose 115, ethanol less than 10, BUN 15, creatinine 1.1 and calcium 10.1 mg/dL.  Her LFTs were unremarkable.  CT head without contrast/CT cervical spine  IMPRESSION: 1. No CT evidence for acute intracranial abnormality. Stable left occipital postsurgical changes 2. Straightening of the cervical spine.  No acute fracture seen. Please see images and full radiology report for further detail.  Review of Systems: As per HPI otherwise 10 point review of systems negative.    Past Medical History:  Diagnosis Date  . Anxiety   . Hypercholesteremia   . Hypertension   . Panic attacks   . PONV (postoperative nausea and vomiting)   . PTSD (post-traumatic stress disorder)   . Seizures (HCC)    2 months since last seizure.  . Stroke [facility])    short term memory loss from 3rd brain surgery.  . Trigeminal neuralgia   . Type 2 diabetes mellitus (HCC)  Past Surgical History:  Procedure Laterality Date  . ABDOMINAL HYSTERECTOMY    . ABDOMINOPLASTY    . APPENDECTOMY    . bladder tack    . BRAIN SURGERY     x4  . CERVICAL ABLATION    . ESOPHAGOGASTRODUODENOSCOPY N/A 01/13/2017   Procedure: ESOPHAGOGASTRODUODENOSCOPY (EGD);  Surgeon: Malissa Hippo, MD;  Location: [facility] ENDO SUITE;  Service: Endoscopy;  Laterality: N/A;  3:00  . Gamma knife Procedure November 2013    .  ORIF ANKLE FRACTURE Right 06/03/2016   Procedure: OPEN REDUCTION INTERNAL FIXATION (ORIF) RIGHT ANKLE;  Surgeon: Vickki Hearing, MD;  Location: [facility] ORS;  Service: Orthopedics;  Laterality: Right;  . TONSILLECTOMY       reports that  has never smoked. she has never used smokeless tobacco. She reports that she does not drink alcohol or use drugs.  Allergies  Allergen Reactions  . Baclofen Other (See Comments)    Seizure activity  . Lorazepam Other (See Comments)    Doesn't tolerate    Family History  Problem Relation Age of Onset  . Depression Paternal Aunt   . Depression Paternal Uncle   . Depression Maternal Grandfather   . Ovarian cancer Maternal Grandfather   . Uterine cancer Mother   . Breast cancer Mother   . Lung cancer Father        x3  . Colon cancer Father   . Heart attack Father   . Hypertension Father   . Hyperlipidemia Father   . Leukemia Paternal Grandmother     Prior to Admission medications   Medication Sig Start Date End Date Taking? Authorizing Provider  atorvastatin (LIPITOR) 80 MG tablet Take 80 mg by mouth daily.   Yes [provider]  cholecalciferol (VITAMIN D) 1000 units tablet Take 1,000 Units by mouth 4 (four) times daily.   Yes [provider]  citalopram (CELEXA) 40 MG tablet Take 40 mg by mouth daily.   Yes [provider]  diazepam (VALIUM) 5 MG tablet Take 5 mg by mouth every 12 (twelve) hours as needed for anxiety.   Yes [provider]  gabapentin (NEURONTIN) 600 MG tablet Take 400 mg by mouth 3 (three) times daily.    Yes [provider]  HYDROmorphone (DILAUDID) 4 MG tablet Take 4 mg by mouth every 6 (six) hours as needed for severe pain.   Yes [provider]  levETIRAcetam (KEPPRA) 750 MG tablet Take 1,500 mg by mouth 2 (two) times daily.   Yes [provider]  lidocaine (XYLOCAINE) 2 % jelly Apply 1 application topically as needed (pain).    Yes [provider]    lisinopril-hydrochlorothiazide (PRINZIDE,ZESTORETIC) 20-25 MG tablet Take 1 tablet by mouth daily.   Yes [provider]  pioglitazone (ACTOS) 15 MG tablet Take 15 mg by mouth daily.   Yes [provider]  promethazine (PHENERGAN) 25 MG suppository Place 25 mg rectally every 6 (six) hours as needed for nausea or vomiting.   Yes [provider]  venlafaxine XR (EFFEXOR-XR) 150 MG 24 hr capsule Take 150 mg by mouth daily with breakfast.   Yes [provider]  ibuprofen (ADVIL,MOTRIN) 400 MG tablet Take 400 mg by mouth every 6 (six) hours as needed for headache or moderate pain.    [provider]  lisinopril (PRINIVIL) 20 MG tablet Take 1 tablet (20 mg total) by mouth daily. 12/03/12   Christiane Ha, MD  magnesium hydroxide (MILK OF MAGNESIA) 400 MG/5ML suspension  Take 15 mLs by mouth daily as needed for mild constipation.    [provider]  magnesium oxide (MAG-OX) 400 MG tablet Take 400 mg by mouth daily.    [provider]  mirtazapine (REMERON) 15 MG tablet Take 30 mg by mouth at bedtime.     [provider]  ondansetron (ZOFRAN ODT) 4 MG disintegrating tablet Take 1 tablet (4 mg total) by mouth every 8 (eight) hours as needed for nausea or vomiting. Patient taking differently: Take 4 mg by mouth every 6 (six) hours as needed for nausea or vomiting.  12/29/13   Gerhard MunchLockwood, Robert, MD  pantoprazole (PROTONIX) 40 MG tablet Take 1 tablet (40 mg total) by mouth daily before breakfast. 01/13/17   Rehman, Joline MaxcyNajeeb U, MD  polyvinyl alcohol-povidone (REFRESH) 1.4-0.6 % ophthalmic solution Apply 1-2 drops to eye daily.     [provider]  tiZANidine (ZANAFLEX) 4 MG tablet Take 4 mg by mouth 2 (two) times daily.     [provider]  zolpidem (AMBIEN) 10 MG tablet Take 10 mg by mouth at bedtime as needed for sleep.    [provider]    Physical Exam: Vitals:   09/27/17 0515 09/27/17 0530 09/27/17 0545  09/27/17 0600  BP: (!) 88/75 (!) 84/69 108/79 (!) 123/91  Pulse: (!) 53  (!) 51   Resp: 12 13 15 15   Temp:      TempSrc:      SpO2: 98%  98%   Weight:      Height:        Constitutional: Looks pale and acutely ill. Eyes: PERRL, lids and conjunctivae normal ENMT: Mucous membranes and lips are very dry.  Posterior pharynx clear of any exudate or lesions.N Neck: normal, supple, no masses, no thyromegaly Respiratory: Clear to auscultation bilaterally, no wheezing, no crackles. Normal respiratory effort. No accessory muscle use.  Cardiovascular: Bradycardic at 49 bpm, positive 1/6 systolic murmur, no rubs, no gallops. No extremity edema. 2+ pedal pulses. No carotid bruits.  Abdomen: Surgical scar present, soft, positive LUQ tenderness, no guarding/rebound /masses palpated. No hepatosplenomegaly. Bowel sounds positive.  Musculoskeletal: no clubbing / cyanosis. Good ROM, no contractures. Normal muscle tone.  Skin: no rashes, lesions, ulcers on limited skin exam Neurologic: CN 2-12 grossly intact. Sensation intact, DTR normal. Strength 5/5 in all 4.  Psychiatric: Alert and oriented x 4.  Depressed mood.    Labs on Admission: I have personally reviewed following labs and imaging studies  CBC: Recent Labs  Lab 09/27/17 0233 09/27/17 0252  WBC 9.5  --   NEUTROABS 4.3  --   HGB 14.4 15.0  HCT 43.1 44.0  MCV 95.1  --   PLT 360  --    Basic Metabolic Panel: Recent Labs  Lab 09/27/17 0233 09/27/17 0252  NA 134* 136  K 4.5 4.2  CL 98* 97*  CO2 28  --   GLUCOSE 115* 110*  BUN 15 15  CREATININE 1.10* 1.10*  CALCIUM 10.1  --    GFR: Estimated Creatinine Clearance: 56 mL/min (A) (by C-G formula based on SCr of 1.1 mg/dL (H)). Liver Function Tests: Recent Labs  Lab 09/27/17 0233  AST 26  ALT 16  ALKPHOS 34*  BILITOT 0.7  PROT 7.9  ALBUMIN 4.0   No results for input(s): LIPASE, AMYLASE in the last 168 hours. No results for input(s): AMMONIA in the last 168  hours. Coagulation Profile: Recent Labs  Lab 09/27/17 0233  INR 0.93   Cardiac  Enzymes: No results for input(s): CKTOTAL, CKMB, CKMBINDEX, TROPONINI in the last 168 hours. BNP (last 3 results) No results for input(s): PROBNP in the last 8760 hours. HbA1C: No results for input(s): HGBA1C in the last 72 hours. CBG: No results for input(s): GLUCAP in the last 168 hours. Lipid Profile: No results for input(s): CHOL, HDL, LDLCALC, TRIG, CHOLHDL, LDLDIRECT in the last 72 hours. Thyroid Function Tests: No results for input(s): TSH, T4TOTAL, FREET4, T3FREE, THYROIDAB in the last 72 hours. Anemia Panel: No results for input(s): VITAMINB12, FOLATE, FERRITIN, TIBC, IRON, RETICCTPCT in the last 72 hours. Urine analysis:    Component Value Date/Time   COLORURINE YELLOW 09/27/2017 0340   APPEARANCEUR HAZY (A) 09/27/2017 0340   LABSPEC 1.004 (L) 09/27/2017 0340   PHURINE 8.0 09/27/2017 0340   GLUCOSEU NEGATIVE 09/27/2017 0340   HGBUR NEGATIVE 09/27/2017 0340   BILIRUBINUR NEGATIVE 09/27/2017 0340   KETONESUR NEGATIVE 09/27/2017 0340   PROTEINUR NEGATIVE 09/27/2017 0340   UROBILINOGEN 0.2 12/29/2013 1215   NITRITE NEGATIVE 09/27/2017 0340   LEUKOCYTESUR MODERATE (A) 09/27/2017 0340    Radiological Exams on Admission: Ct Head Wo Contrast  Result Date: 09/27/2017 CLINICAL DATA:  Seizure and fall EXAM: CT HEAD WITHOUT CONTRAST CT CERVICAL SPINE WITHOUT CONTRAST TECHNIQUE: Multidetector CT imaging of the head and cervical spine was performed following the standard protocol without intravenous contrast. Multiplanar CT image reconstructions of the cervical spine were also generated. COMPARISON:  11/02/2016 FINDINGS: CT HEAD FINDINGS Brain: No acute territorial infarction, hemorrhage or intracranial mass is visualized. Small amount of encephalomalacia in the left cerebellum nonenlarged ventricles. Vascular: No hyperdense vessels.  No unexpected calcification Skull: No fracture. Left occipital  craniectomy with hyperdense material and metallic plate as before. Sinuses/Orbits: Mild mucosal thickening in the ethmoid and sphenoid sinuses. No acute orbital abnormality Other: None CT CERVICAL SPINE FINDINGS Alignment: Straightening of the cervical spine. No subluxation. Facet alignment within normal limits Skull base and vertebrae: No acute fracture. No primary bone lesion or focal pathologic process. Soft tissues and spinal canal: No prevertebral fluid or swelling. No visible canal hematoma. Disc levels:  Mild degenerative changes at C5-C6 and C6-C7 Upper chest: Negative. Other: None IMPRESSION: 1. No CT evidence for acute intracranial abnormality. Stable left occipital postsurgical changes 2. Straightening of the cervical spine.  No acute fracture seen Electronically Signed   By: Jasmine Pang M.D.   On: 09/27/2017 03:51   Ct Cervical Spine Wo Contrast  Result Date: 09/27/2017 CLINICAL DATA:  Seizure and fall EXAM: CT HEAD WITHOUT CONTRAST CT CERVICAL SPINE WITHOUT CONTRAST TECHNIQUE: Multidetector CT imaging of the head and cervical spine was performed following the standard protocol without intravenous contrast. Multiplanar CT image reconstructions of the cervical spine were also generated. COMPARISON:  11/02/2016 FINDINGS: CT HEAD FINDINGS Brain: No acute territorial infarction, hemorrhage or intracranial mass is visualized. Small amount of encephalomalacia in the left cerebellum nonenlarged ventricles. Vascular: No hyperdense vessels.  No unexpected calcification Skull: No fracture. Left occipital craniectomy with hyperdense material and metallic plate as before. Sinuses/Orbits: Mild mucosal thickening in the ethmoid and sphenoid sinuses. No acute orbital abnormality Other: None CT CERVICAL SPINE FINDINGS Alignment: Straightening of the cervical spine. No subluxation. Facet alignment within normal limits Skull base and vertebrae: No acute fracture. No primary bone lesion or focal pathologic process.  Soft tissues and spinal canal: No prevertebral fluid or swelling. No visible canal hematoma. Disc levels:  Mild degenerative changes at C5-C6 and C6-C7 Upper chest: Negative. Other: None IMPRESSION: 1. No  CT evidence for acute intracranial abnormality. Stable left occipital postsurgical changes 2. Straightening of the cervical spine.  No acute fracture seen Electronically Signed   By: Jasmine PangKim  Fujinaga M.D.   On: 09/27/2017 03:51    EKG: Independently reviewed. Vent. rate 67 BPM PR interval * ms QRS duration 84 ms QT/QTc 477/504 ms P-R-T axes 43 58 38 Sinus rhythm Borderline prolonged QT interval  Assessment/Plan Principal Problem:   Symptomatic sinus bradycardia Admit to stepdown/inpatient. Supplemental oxygen as needed. Continue IV fluids. Hold Dramamine and some sedating medications. Atropine as needed as needed. Consult cardiology.  Active Problems:   Syncopal episodes Secondary to bradycardiac and volume depletion. Check echocardiogram and carotid Doppler. Cardiology consult.    Volume depletion Continue IV fluids. Monitor intake and output.    Seizure (HCC) Continue Keppra 1500 mg p.o. twice daily. Late entry: The patient had a seizure in the ICU. Apparently she has not taking her Keppra due to symptoms and emesis in the past 2 days. This is the most likely explanation for the patient's multiple seizures. Will switch to IV Keppra for now.  Consult neurology.    UTI (urinary tract infection) Ceftriaxone 1 g IVPB every 24 hours. Follow-up urine culture and sensitivity.    Left lower quadrant pain No fever or leukocytosis. Likely due to UTI or constipation.    Positive i-STAT hCG Serum confirmation from the lab still pending.    Trigeminal neuralgia She has stopped taking oral hydromorphone. Continue gabapentin 400 mg p.o. 3 times daily.    Hypertension Hold lisinopril. Hold hydrochlorothiazide. Monitor blood pressure, renal function and electrolytes.     Hyperlipidemia Hold atorvastatin 80 mg p.o. daily, pending hCG results. Monitor LFTs as needed. Fasting lipid profile to be followed as an outpatient.    Anxiety Continue diazepam 5 mg every 12 hours as needed.      DVT prophylaxis: Lovenox SQ. Code Status: Full code. Family Communication: Her significant other was present in the ED room. Disposition Plan: Observation for symptomatic bradycardia, syncopal episodes and cardiology consult. Consults called: Cardiology consult. Admission status: Observation/stepdown.   Bobette Moavid Manuel Ortiz MD [facility] Pager [phone].  If 7PM-7AM, please contact night-coverage [email or web address] Password [email or web address]  09/27/2017, 6:14 AM    This document was prepared using Dragon voice recognition software and may contain some unintended errors.

## 2017-09-27 NOTE — Care Management Obs Status (Signed)
MEDICARE OBSERVATION STATUS NOTIFICATION   Patient Details  Name: Mariah Ibarra MRN: [id number] Date of Birth: 12/01/1970   Medicare Observation Status Notification Given:  Yes(verbal signature-significant other-[place] [phone])-patient lethargic-)    Kinzy Weyers, Chrystine OilerSharley Diane, RN 09/27/2017, 2:10 PM

## 2017-09-27 NOTE — Progress Notes (Signed)
CARDIOLOGY CONSULT NOTE    Patient ID: Mariah Ibarra; [id number]; [birth year]   Admit date: 09/27/2017 Date of Consult: 09/27/2017  Primary Care Provider: Frederich Chick., MD Consulting Cardiologist: Dr. Jonelle Sidle   Patient Profile:   DMYA Ibarra is a 46 y.o. female with a hx of anxiety, hyperlipidemia, hypertension, type 2 diabetes, seizures, CVA, PTSD and panic attacks, previous stroke with memory loss, who is being seen today for the evaluation of bradycardia at the request of Dr. Arbutus Leas, hospitalist service.  History of Present Illness:   [name] presented to the emergency room after experiencing significant lethargy, episodes of blackouts and seizures after falling and hitting her head on the floor.  He also had history of nausea and vomiting.  Also according to H&P, the patient had an episode of hematochezia over the weekend.  She also had worsening dizziness for which she was taking doses of Dramamine.  She continues to take all her medications but vomited them up over the last few days. She is followed by Dr. Gerilyn Pilgrim.   On arrival to the emergency room blood pressure 102/74, she was found to be bradycardic with a heart rate of 38 bpm, EKG revealed sinus bradycardia, prolonged QT interval, short PR interval.  Pertinent labs, UDS positive for benzodiazepine.  Creatinine 1.10, glucose 110, beta hCG was elevated at 10.9 (she has had a hysterectomy).  She was not found to be anemic, thrombocytopenic, or have leukocytosis.  CT of the head revealed no evidence of acute intracranial abnormality,  She was treated with 1 mg of atropine x1 and atropine 0.5 mg x1.  Given IV fluid bolus.  She is still feeling weak and having complaints of pain in her right eye, burning needles. Blurred vision in her left eye. She complains of left lower quadrant pain.  She reports an unintended 100 pound weight loss over the last few months.  Past Medical History:  Diagnosis Date  .  Anxiety   . Hypercholesteremia   . Hypertension   . Panic attacks   . PTSD (post-traumatic stress disorder)   . Seizures (HCC)   . Stroke [facility])    Short term memory loss from 3rd brain surgery.  . Trigeminal neuralgia   . Type 2 diabetes mellitus (HCC)     Past Surgical History:  Procedure Laterality Date  . ABDOMINAL HYSTERECTOMY    . ABDOMINOPLASTY    . APPENDECTOMY    . Bladder tack    . BRAIN SURGERY     x4  . CERVICAL ABLATION    . ESOPHAGOGASTRODUODENOSCOPY N/A 01/13/2017   Procedure: ESOPHAGOGASTRODUODENOSCOPY (EGD);  Surgeon: Malissa Hippo, MD;  Location: [facility];  Service: Endoscopy;  Laterality: N/A;  3:00  . Gamma knife Procedure November 2013    . ORIF ANKLE FRACTURE Right 06/03/2016   Procedure: OPEN REDUCTION INTERNAL FIXATION (ORIF) RIGHT ANKLE;  Surgeon: Vickki Hearing, MD;  Location: [facility] ORS;  Service: Orthopedics;  Laterality: Right;  . TONSILLECTOMY       Home Medications:  Prior to Admission medications   Medication Sig Start Date End Date Taking? Authorizing Provider  atorvastatin (LIPITOR) 80 MG tablet Take 80 mg by mouth daily.   Yes [provider]  cholecalciferol (VITAMIN D) 1000 units tablet Take 1,000 Units by mouth 4 (four) times daily.   Yes [provider]  citalopram (CELEXA) 40 MG tablet Take 40 mg by mouth daily.   Yes [provider]  diazepam (  VALIUM) 5 MG tablet Take 5 mg by mouth every 12 (twelve) hours as needed for anxiety.   Yes [provider]  gabapentin (NEURONTIN) 400 MG capsule Take 400 mg by mouth 3 (three) times daily.   Yes [provider]  levETIRAcetam (KEPPRA) 750 MG tablet Take 1,500 mg by mouth 2 (two) times daily.   Yes [provider]  lidocaine (XYLOCAINE) 2 % jelly Apply 1 application topically as needed (pain).    Yes [provider]  lisinopril-hydrochlorothiazide (PRINZIDE,ZESTORETIC) 20-25 MG tablet Take 1 tablet by mouth daily.   Yes [provider]  magnesium hydroxide (MILK OF MAGNESIA) 400 MG/5ML suspension Take 15 mLs by mouth daily as needed for mild constipation.   Yes [provider]  magnesium oxide (MAG-OX) 400 MG tablet Take 400 mg by mouth daily.   Yes [provider]  mirtazapine (REMERON) 15 MG tablet Take 30 mg by mouth at bedtime.    Yes [provider]  pioglitazone (ACTOS) 15 MG tablet Take 15 mg by mouth daily.   Yes [provider]  polyvinyl alcohol-povidone (REFRESH) 1.4-0.6 % ophthalmic solution Apply 1-2 drops to eye daily.    Yes [provider]  promethazine (PHENERGAN) 25 MG suppository Place 25 mg rectally every 6 (six) hours as needed for nausea or vomiting.   Yes [provider]  tiZANidine (ZANAFLEX) 4 MG tablet Take 4 mg by mouth 2 (two) times daily.    Yes [provider]  venlafaxine XR (EFFEXOR-XR) 150 MG 24 hr capsule Take 150 mg by mouth daily with breakfast.   Yes [provider]  zolpidem (AMBIEN) 10 MG tablet Take 10 mg by mouth at bedtime as needed for sleep.   Yes [provider]    Inpatient Medications: Scheduled Meds: . atropine      . cholecalciferol  1,000 Units Oral QID  . citalopram  40 mg Oral Daily  . gabapentin  400 mg Oral TID  . hydrocortisone  25 mg Rectal BID  . levETIRAcetam  1,500 mg Oral BID  . polyvinyl alcohol  1-2 drop Both Eyes Daily  . venlafaxine XR  150 mg Oral Q breakfast   Continuous Infusions: . sodium chloride 100 mL/hr at 09/27/17 0700   PRN Meds: atropine, magnesium hydroxide  Allergies:    Allergies  Allergen Reactions  . Baclofen Other (See Comments)    Seizure activity  . Lorazepam Other (See Comments)    Doesn't tolerate    Social History:   Social History   Socioeconomic History  . Marital status: Divorced    Spouse name: Not on file  . Number of children: Not on file  . Years of education: Not on file  . Highest education level: Not on file    Social Needs  . Financial resource strain: Not on file  . Food insecurity - worry: Not on file  . Food insecurity - inability: Not on file  . Transportation needs - medical: Not on file  . Transportation needs - non-medical: Not on file  Occupational History  . Not on file  Tobacco Use  . Smoking status: Never Smoker  . Smokeless tobacco: Never Used  Substance and Sexual Activity  . Alcohol use: No    Comment: occ  . Drug use: No  . Sexual activity: Yes    Birth control/protection: None, Surgical  Other Topics Concern  . Not on file  Social History Narrative   ** Merged History Encounter **  Family History:    Family History  Problem Relation Age of Onset  . Depression Paternal Aunt   . Depression Paternal Uncle   . Depression Maternal Grandfather   . Ovarian cancer Maternal Grandfather   . Uterine cancer Mother   . Breast cancer Mother   . Lung cancer Father        x3  . Colon cancer Father   . Heart attack Father   . Hypertension Father   . Hyperlipidemia Father   . Leukemia Paternal Grandmother      ROS:  Please see the history of present illness.  ROS  All other ROS reviewed and negative.     Physical Exam/Data:   Vitals:   09/27/17 0530 09/27/17 0545 09/27/17 0600 09/27/17 0633  BP: (!) 84/69 108/79 (!) 123/91   Pulse:  (!) 51    Resp: 13 15 15    Temp:    (!) 97.4 F (36.3 C)  TempSrc:    Oral  SpO2:  98%    Weight:    146 lb 2.6 oz (66.3 kg)  Height:    5\' 2"  (1.575 m)   No intake or output data in the 24 hours ending 09/27/17 0959 Filed Weights   09/27/17 0238 09/27/17 0633  Weight: 140 lb (63.5 kg) 146 lb 2.6 oz (66.3 kg)   Body mass index is 26.73 kg/m.  General: Thin, pale, generalized weakness. HEENT: Pupils slow to react to light.  Lymph: no adenopathy Neck: no JVD Endocrine:  No thryomegaly Vascular: No carotid bruits; FA pulses 2+ bilaterally without bruits  Cardiac:  normal S1, S2; RRR, bradycardic; no murmur  Lungs:   Clear to auscultation bilaterally, no wheezing, rhonchi or rales  Abd: soft, tenderness in the left lower quadrant, no hepatomegaly  Ext: no edema Musculoskeletal:  No deformities, BUE and BLE strength normal and equal Skin: warm and dry pale Neuro:  CNs 2-12 intact, no focal abnormalities noted Psych:  Normal affect   EKG:  The EKG was personally reviewed and demonstrates:  Sinus bradycardia, rate of 38 bpm with prolonged QT interval, short PR interval.  Telemetry:  Telemetry was personally reviewed and demonstrates: Sinus bradycardia and possible intermittent junctional rhythm.  No pauses.  Relevant CV Studies: None  Laboratory Data:  Chemistry Recent Labs  Lab 09/27/17 0233 09/27/17 0252  NA 134* 136  K 4.5 4.2  CL 98* 97*  CO2 28  --   GLUCOSE 115* 110*  BUN 15 15  CREATININE 1.10* 1.10*  CALCIUM 10.1  --   GFRNONAA 59*  --   GFRAA >60  --   ANIONGAP 8  --     Recent Labs  Lab 09/27/17 0233  PROT 7.9  ALBUMIN 4.0  AST 26  ALT 16  ALKPHOS 34*  BILITOT 0.7   Hematology Recent Labs  Lab 09/27/17 0233 09/27/17 0252  WBC 9.5  --   RBC 4.53  --   HGB 14.4 15.0  HCT 43.1 44.0  MCV 95.1  --   MCH 31.8  --   MCHC 33.4  --   RDW 13.0  --   PLT 360  --    Cardiac EnzymesNo results for input(s): TROPONINI in the last 168 hours.  Recent Labs  Lab 09/27/17 0302  TROPIPOC 0.00     Radiology/Studies:  Ct Head Wo Contrast  Result Date: 09/27/2017 CLINICAL DATA:  Seizure and fall EXAM: CT HEAD WITHOUT CONTRAST CT CERVICAL SPINE WITHOUT CONTRAST TECHNIQUE: Multidetector CT imaging of  the head and cervical spine was performed following the standard protocol without intravenous contrast. Multiplanar CT image reconstructions of the cervical spine were also generated. COMPARISON:  11/02/2016 FINDINGS: CT HEAD FINDINGS Brain: No acute territorial infarction, hemorrhage or intracranial mass is visualized. Small amount of encephalomalacia in the left cerebellum  nonenlarged ventricles. Vascular: No hyperdense vessels.  No unexpected calcification Skull: No fracture. Left occipital craniectomy with hyperdense material and metallic plate as before. Sinuses/Orbits: Mild mucosal thickening in the ethmoid and sphenoid sinuses. No acute orbital abnormality Other: None CT CERVICAL SPINE FINDINGS Alignment: Straightening of the cervical spine. No subluxation. Facet alignment within normal limits Skull base and vertebrae: No acute fracture. No primary bone lesion or focal pathologic process. Soft tissues and spinal canal: No prevertebral fluid or swelling. No visible canal hematoma. Disc levels:  Mild degenerative changes at C5-C6 and C6-C7 Upper chest: Negative. Other: None IMPRESSION: 1. No CT evidence for acute intracranial abnormality. Stable left occipital postsurgical changes 2. Straightening of the cervical spine.  No acute fracture seen Electronically Signed   By: Jasmine PangKim  Fujinaga M.D.   On: 09/27/2017 03:51   Ct Cervical Spine Wo Contrast  Result Date: 09/27/2017 CLINICAL DATA:  Seizure and fall EXAM: CT HEAD WITHOUT CONTRAST CT CERVICAL SPINE WITHOUT CONTRAST TECHNIQUE: Multidetector CT imaging of the head and cervical spine was performed following the standard protocol without intravenous contrast. Multiplanar CT image reconstructions of the cervical spine were also generated. COMPARISON:  11/02/2016 FINDINGS: CT HEAD FINDINGS Brain: No acute territorial infarction, hemorrhage or intracranial mass is visualized. Small amount of encephalomalacia in the left cerebellum nonenlarged ventricles. Vascular: No hyperdense vessels.  No unexpected calcification Skull: No fracture. Left occipital craniectomy with hyperdense material and metallic plate as before. Sinuses/Orbits: Mild mucosal thickening in the ethmoid and sphenoid sinuses. No acute orbital abnormality Other: None CT CERVICAL SPINE FINDINGS Alignment: Straightening of the cervical spine. No subluxation. Facet  alignment within normal limits Skull base and vertebrae: No acute fracture. No primary bone lesion or focal pathologic process. Soft tissues and spinal canal: No prevertebral fluid or swelling. No visible canal hematoma. Disc levels:  Mild degenerative changes at C5-C6 and C6-C7 Upper chest: Negative. Other: None IMPRESSION: 1. No CT evidence for acute intracranial abnormality. Stable left occipital postsurgical changes 2. Straightening of the cervical spine.  No acute fracture seen Electronically Signed   By: Jasmine PangKim  Fujinaga M.D.   On: 09/27/2017 03:51    Assessment and Plan:   1. Bradycardia: Heart rate as low as the upper 30s with sinus bradycardia noted and some possible junctional rhythm.  No pauses observed.  She is not on any AV nodal blockers.  Has had associated nausea, dizziness, seizures, question whether this could be a reflection of increased vagal tone or even increased intracranial pressures. Will check TSH. Do not think she requires PPM at this time. Echo is pending.   2. Hx of Hypertension:  She is on lisinopril/HCTZ at home  This has been held for now in the setting of relative hypotension and falls. Creatinine 1.10. She is receiving IV fluid hydration.   3. Seizures with falls: Followed by Dr. Gerilyn [place], neurology.   4. Hx of Diabetes: On PO Actos for glucose control   5. Abdominal Pain: Complaints of hematochezia and lower left abdominal pain with unintended 100 pound  weight loss.  Consider CT scan.  GI consult.  For questions or updates, please contact [facility] Please consult [email or web address] for contact info under Cardiology/STEMI.   Signed,  Bettey Mare. Liborio Nixon, ANP, AACC   09/27/2017 9:59 AM    Attending note:  Patient seen and examined.  Reviewed available records and discussed the case with Ms. Lawrence DNP as well as modifying the above note.  Ms. Charley presents to the hospital with increasing weakness and falls as well as seizure activity.  She has also  experienced nausea and emesis, reportedly some hematochezia as well.  She did not take Keppra over the weekend, was using Dramamine secondary to dizziness.  She is noted to be bradycardic, largely sinus bradycardia with some junctional rhythm, lowest heart rate 38 recorded.  Head CT reports no acute intracranial process with stable left occipital postsurgical changes, no fracture of the cervical spine.  On examination this morning she is somnolent following medications.  Heart rate currently in the 50s in sinus bradycardia by telemetry which I personally reviewed.  Systolic blood pressure ranging 120-150.  Lungs exhibit diminished breath sounds without wheezing.  Cardiac exam reveals RRR without gallop.  Lab work shows potassium 4.2, creatinine 1.1, troponin I 0.00, UDS positive for benzodiazepines.  Personally reviewed the ECG from 09/27/2017 which shows sinus him with prolonged QT interval.  Sinus bradycardia and intermittent junctional rhythm (by telemetry not ECG).  No obvious offending agents, not on any AV nodal blockers.  Prolonged QT interval could be related to Celexa.  It is not entirely clear that bradycardia is precipitating symptoms, question whether it could in fact be related to increased vagal tone or even increasing intracranial pressure in light of her neurological history and recent seizures. At this point there is no clear indication for pacemaker, would follow telemetry closely.  Echocardiogram is pending as well.  Check TSH.  Jonelle Sidle, M.D., F.A.C.C.

## 2017-09-27 NOTE — Progress Notes (Addendum)
PROGRESS NOTE  Mariah [facility]atterMelinda F [name] ZOX:[id number] DOB: 01/16/1971 DOA: 09/27/2017 [name], [name], MD  Brief History:  46 year old female with a history of seizure disorder, hypertension, hyperlipidemia, anxiety/panic attacks presenting with syncope and nausea and vomiting.  The patient had numerous episodes of nausea and vomiting that began on September 21, 2017 without any hematemesis.  She also complained of lower abdominal pain without dysuria or hematuria.  She denied any fevers, chills, chest pain, shortness breath, cough, hemoptysis.  Her nausea and vomiting improved, and she has not had any emesis for the past 3 days prior to admission.  However, for some reason the patient quit taking her Keppra over the weekend, and she has not taken it for 3 days prior to admission.  The patient began feeling dizzy.  To treat her nausea and dizziness, the patient began taking over-the-counter Dramamine 4-5 times per day  starting on Thanksgiving day.  Since then, the patient had multiple falls and syncopal episodes.  According to her significant other, many of her syncopal episodes occur without any prodromal type symptoms as she is getting up to go to the bathroom.  However during a couple of episodes, he noted that the patient had some "shaking"which would last only a few seconds.  He states that during some of these episodes the patient would be awake, and speak nonsensically.  In addition, she has had some episodes where she had some prodromal symptom of blurry vision from her right eye.  Over the past week, she has had very poor oral intake, but denies any diarrhea.  In fact she has felt constipated.  As a result, the patient had some hematochezia over the weekend.  She denies any melena or hematemesis or hematuria.  In the emergency department, the patient was afebrile hemodynamically stable.  She was noted to be bradycardic with heart rate 37.  She was given 2 doses of atropine.  BMP, CBC, and  LFTs were essentially unremarkable.  EKG shows sinus rhythm with early repolarization.  Assessment/Plan: Syncope -Likely multifactorial including volume depletion with possible seizure and symptomatic bradycardia -Echocardiogram -Monitor on telemetry -Urine drug screen positive for benzodiazepines -EEG  Bradycardia -Cardiology consult -TSH -Echocardiogram  Seizure disorder/Seizure like activity -Suspected degree of noncompliance -hx also sound like possible PNES  -Patient quit taking her Keppra for 3 days prior to admission even though her vomiting improved -[facility] shows that she has self adjusted or stopped her medications in the past -Continue Keppra and gabapentin -Neurology consultation -check CPK -MR brain  Hypotension -Blood cultures x2 sets -Check lactic acid -Continue IV fluids -UA and urine culture  Positive i-STAT hCG -Serum HCG = 6 (marginally positive) -pt with hysterectomy -discussed with OB/GYN, Dr. Loma BostonEure-->likely false positive due to pt's perimenopausal state with spike in LH-->would not repeat HCG  Essential hypertension -Holding lisinopril and HCTZ secondary to hypotension  Hematochezia/abdominal pain -Likely due to constipation -Hemoglobin stable -Try Anusol suppositories stock orthotics -consult GI -CT abdomen/pelvis -am CBC  Hyponatremia -due to volume depletion  Depression/Anxiety -Continue Celexa, Remeron, Effexor  Somnolence -d/c Remeron and valium -pt arouses and answers question -will not d/c gabapentin for now due to seizure hx--defer to neurology   Disposition Plan:   Home in 2-3 days  Family Communication:   Significant other at bedside--Total time spent 35 minutes.  Greater than 50% spent face to face counseling and coordinating care. 0800 to 0835  Consultants:  Neurology, cardiology  Code Status:  FULL   DVT Prophylaxis:  SCDs   Procedures: As Listed in Progress Note  Above  Antibiotics: None    Subjective: Patient denies any fever, chills, chest pain,, coughing, hemoptysis, nausea, vomiting patient has some left lower quadrant abdominal pain.  She denies any headache, neck pain.  Objective: Vitals:   09/27/17 0530 09/27/17 0545 09/27/17 0600 09/27/17 0633  BP: (!) 84/69 108/79 (!) 123/91   Pulse:  (!) 51    Resp: 13 15 15    Temp:    (!) 97.4 F (36.3 C)  TempSrc:    Oral  SpO2:  98%    Weight:    66.3 kg (146 lb 2.6 oz)  Height:    5\' 2"  (1.575 m)   No intake or output data in the 24 hours ending 09/27/17 0805 Weight change:  Exam:   General:  Pt is alert, follows commands appropriately, not in acute distress  HEENT: No icterus, No thrush, No neck mass, [place]/AT  Cardiovascular: RRR, S1/S2, no rubs, no gallops  Respiratory: Diminished breath sounds bilateral, but CTA bilaterally, no wheezing, no crackles, no rhonchi  Abdomen: Soft/+BS, LLQ tender, non distended, no guarding  Extremities: No edema, No lymphangitis, No petechiae, No rashes, no synovitis   Data Reviewed: I have personally reviewed following labs and imaging studies Basic Metabolic Panel: Recent Labs  Lab 09/27/17 0233 09/27/17 0252  NA 134* 136  K 4.5 4.2  CL 98* 97*  CO2 28  --   GLUCOSE 115* 110*  BUN 15 15  CREATININE 1.10* 1.10*  CALCIUM 10.1  --   MG 2.1  --    Liver Function Tests: Recent Labs  Lab 09/27/17 0233  AST 26  ALT 16  ALKPHOS 34*  BILITOT 0.7  PROT 7.9  ALBUMIN 4.0   No results for input(s): LIPASE, AMYLASE in the last 168 hours. No results for input(s): AMMONIA in the last 168 hours. Coagulation Profile: Recent Labs  Lab 09/27/17 0233  INR 0.93   CBC: Recent Labs  Lab 09/27/17 0233 09/27/17 0252  WBC 9.5  --   NEUTROABS 4.3  --   HGB 14.4 15.0  HCT 43.1 44.0  MCV 95.1  --   PLT 360  --    Cardiac Enzymes: No results for input(s): CKTOTAL, CKMB, CKMBINDEX, TROPONINI in the last 168 hours. BNP: Invalid input(s):  POCBNP CBG: No results for input(s): GLUCAP in the last 168 hours. HbA1C: No results for input(s): HGBA1C in the last 72 hours. Urine analysis:    Component Value Date/Time   COLORURINE YELLOW 09/27/2017 0340   APPEARANCEUR HAZY (A) 09/27/2017 0340   LABSPEC 1.004 (L) 09/27/2017 0340   PHURINE 8.0 09/27/2017 0340   GLUCOSEU NEGATIVE 09/27/2017 0340   HGBUR NEGATIVE 09/27/2017 0340   BILIRUBINUR NEGATIVE 09/27/2017 0340   KETONESUR NEGATIVE 09/27/2017 0340   PROTEINUR NEGATIVE 09/27/2017 0340   UROBILINOGEN 0.2 12/29/2013 1215   NITRITE NEGATIVE 09/27/2017 0340   LEUKOCYTESUR MODERATE (A) 09/27/2017 0340   Sepsis Labs: @LABRCNTIP (procalcitonin:4,lacticidven:4) )No results found for this or any previous visit (from the past 240 hour(s)).   Scheduled Meds: . atropine      . cholecalciferol  1,000 Units Oral QID  . citalopram  40 mg Oral Daily  . gabapentin  400 mg Oral TID  . levETIRAcetam  1,500 mg Oral BID  . mirtazapine  30 mg Oral QHS  . polyvinyl alcohol  1-2 drop Both Eyes Daily  .  venlafaxine XR  150 mg Oral Q breakfast   Continuous Infusions: . sodium chloride 100 mL/hr at 09/27/17 0700  . levETIRAcetam    . sodium chloride 1,000 mL (09/27/17 0711)    Procedures/Studies: Ct Head Wo Contrast  Result Date: 09/27/2017 CLINICAL DATA:  Seizure and fall EXAM: CT HEAD WITHOUT CONTRAST CT CERVICAL SPINE WITHOUT CONTRAST TECHNIQUE: Multidetector CT imaging of the head and cervical spine was performed following the standard protocol without intravenous contrast. Multiplanar CT image reconstructions of the cervical spine were also generated. COMPARISON:  11/02/2016 FINDINGS: CT HEAD FINDINGS Brain: No acute territorial infarction, hemorrhage or intracranial mass is visualized. Small amount of encephalomalacia in the left cerebellum nonenlarged ventricles. Vascular: No hyperdense vessels.  No unexpected calcification Skull: No fracture. Left occipital craniectomy with hyperdense  material and metallic plate as before. Sinuses/Orbits: Mild mucosal thickening in the ethmoid and sphenoid sinuses. No acute orbital abnormality Other: None CT CERVICAL SPINE FINDINGS Alignment: Straightening of the cervical spine. No subluxation. Facet alignment within normal limits Skull base and vertebrae: No acute fracture. No primary bone lesion or focal pathologic process. Soft tissues and spinal canal: No prevertebral fluid or swelling. No visible canal hematoma. Disc levels:  Mild degenerative changes at C5-C6 and C6-C7 Upper chest: Negative. Other: None IMPRESSION: 1. No CT evidence for acute intracranial abnormality. Stable left occipital postsurgical changes 2. Straightening of the cervical spine.  No acute fracture seen Electronically Signed   By: Jasmine Pang M.D.   On: 09/27/2017 03:51   Ct Cervical Spine Wo Contrast  Result Date: 09/27/2017 CLINICAL DATA:  Seizure and fall EXAM: CT HEAD WITHOUT CONTRAST CT CERVICAL SPINE WITHOUT CONTRAST TECHNIQUE: Multidetector CT imaging of the head and cervical spine was performed following the standard protocol without intravenous contrast. Multiplanar CT image reconstructions of the cervical spine were also generated. COMPARISON:  11/02/2016 FINDINGS: CT HEAD FINDINGS Brain: No acute territorial infarction, hemorrhage or intracranial mass is visualized. Small amount of encephalomalacia in the left cerebellum nonenlarged ventricles. Vascular: No hyperdense vessels.  No unexpected calcification Skull: No fracture. Left occipital craniectomy with hyperdense material and metallic plate as before. Sinuses/Orbits: Mild mucosal thickening in the ethmoid and sphenoid sinuses. No acute orbital abnormality Other: None CT CERVICAL SPINE FINDINGS Alignment: Straightening of the cervical spine. No subluxation. Facet alignment within normal limits Skull base and vertebrae: No acute fracture. No primary bone lesion or focal pathologic process. Soft tissues and spinal  canal: No prevertebral fluid or swelling. No visible canal hematoma. Disc levels:  Mild degenerative changes at C5-C6 and C6-C7 Upper chest: Negative. Other: None IMPRESSION: 1. No CT evidence for acute intracranial abnormality. Stable left occipital postsurgical changes 2. Straightening of the cervical spine.  No acute fracture seen Electronically Signed   By: Jasmine Pang M.D.   On: 09/27/2017 03:51    Golden Emile, DO  [facility] Pager [phone]  If 7PM-7AM, please contact night-coverage [email or web address] Password [email or web address] 09/27/2017, 8:05 AM   LOS: 0 days

## 2017-09-28 DIAGNOSIS — R001 Bradycardia, unspecified: Secondary | ICD-10-CM | POA: Diagnosis not present

## 2017-09-28 DIAGNOSIS — R569 Unspecified convulsions: Secondary | ICD-10-CM | POA: Diagnosis not present

## 2017-09-28 DIAGNOSIS — E871 Hypo-osmolality and hyponatremia: Secondary | ICD-10-CM | POA: Diagnosis not present

## 2017-09-28 DIAGNOSIS — I10 Essential (primary) hypertension: Secondary | ICD-10-CM | POA: Diagnosis not present

## 2017-09-28 DIAGNOSIS — E869 Volume depletion, unspecified: Secondary | ICD-10-CM | POA: Diagnosis not present

## 2017-09-28 DIAGNOSIS — R51 Headache: Secondary | ICD-10-CM | POA: Diagnosis not present

## 2017-09-28 DIAGNOSIS — R55 Syncope and collapse: Secondary | ICD-10-CM | POA: Diagnosis not present

## 2017-09-28 DIAGNOSIS — R1032 Left lower quadrant pain: Secondary | ICD-10-CM | POA: Diagnosis not present

## 2017-09-28 DIAGNOSIS — G9340 Encephalopathy, unspecified: Secondary | ICD-10-CM | POA: Diagnosis not present

## 2017-09-28 LAB — BASIC METABOLIC PANEL
Anion gap: 10 (ref 5–15)
BUN: 8 mg/dL (ref 6–20)
CALCIUM: 9 mg/dL (ref 8.9–10.3)
CO2: 20 mmol/L — AB (ref 22–32)
CREATININE: 0.98 mg/dL (ref 0.44–1.00)
Chloride: 109 mmol/L (ref 101–111)
Glucose, Bld: 76 mg/dL (ref 65–99)
Potassium: 3.9 mmol/L (ref 3.5–5.1)
SODIUM: 139 mmol/L (ref 135–145)

## 2017-09-28 LAB — CBC
HCT: 40.6 % (ref 36.0–46.0)
Hemoglobin: 13.1 g/dL (ref 12.0–15.0)
MCH: 31.5 pg (ref 26.0–34.0)
MCHC: 32.3 g/dL (ref 30.0–36.0)
MCV: 97.6 fL (ref 78.0–100.0)
PLATELETS: 278 10*3/uL (ref 150–400)
RBC: 4.16 MIL/uL (ref 3.87–5.11)
RDW: 13.5 % (ref 11.5–15.5)
WBC: 10 10*3/uL (ref 4.0–10.5)

## 2017-09-28 LAB — GLUCOSE, CAPILLARY
GLUCOSE-CAPILLARY: 149 mg/dL — AB (ref 65–99)
GLUCOSE-CAPILLARY: 92 mg/dL (ref 65–99)
GLUCOSE-CAPILLARY: 196 mg/dL — AB (ref 65–99)
Glucose-Capillary: 105 mg/dL — ABNORMAL HIGH (ref 65–99)

## 2017-09-28 NOTE — Consult Note (Signed)
Reason for Consult: rectal bleeding.  Referring Physician:  AJAYA CRUTCHFIELD is an 46 y.o. female.  HPI: Admitted thru the ED for symptomatic bradycardia. She also tells me she had a seziure before coming to the ED. Hx of seizures. She also was c/o left eye pain what actually prompted the visit to the ED. In the ED, noted she a a pulse rate. Had been sick x 3 days with nausea and vomiting and black out spells. Also tells me, she had some rectal bleeding about a week ago x 1 while she was constipated.  Had not had a BM for a couple of days during this time.  She has not seen any blood since that one episode. No fever, chills, SOB. Per records she had a pulse of 38 in the ED.  Significant other in the room states she has had diarrhea all night.  She has had 3 loose stools since admission. She underwent and EGD in March of this year for nausea and vomiting and wt loss which revealed  Impression:               - Normal esophagus.                           - Z-line irregular, 36 cm from the incisors.                           - Multiple cars in the gastric antrum.                           - Gastritis. Biopsied.                           - Normal duodenal bulb and second portion of the                            duodenum.   Family of colon cancer in father in his late 60s.   Has never undergone a colonoscopy in the past.a  Hx of anxiety, panic attacks, PTSD, hypertension, seizures, CVA,  And brain surgery for trigeminal neuralgia.  Has a cardiology consult for her bradycardia. Received atropine this am for bradycardia.  Has not taken her Keppra for a couple of days prior to admission.   Past Medical History:  Diagnosis Date  . Anxiety   . Hypercholesteremia   . Hypertension   . Panic attacks   . PTSD (post-traumatic stress disorder)   . Seizures ([place])   . Stroke [facility])    Short term memory loss from 3rd brain surgery.  . Trigeminal neuralgia   . Type 2 diabetes mellitus ([place])     Past  Surgical History:  Procedure Laterality Date  . ABDOMINAL HYSTERECTOMY    . ABDOMINOPLASTY    . APPENDECTOMY    . Bladder tack    . BRAIN SURGERY     x4  . CERVICAL ABLATION    . ESOPHAGOGASTRODUODENOSCOPY N/A 01/13/2017   Procedure: ESOPHAGOGASTRODUODENOSCOPY (EGD);  Surgeon: Rogene Houston, MD;  Location: [facility] ENDO [facility];  Service: Endoscopy;  Laterality: N/A;  3:00  . Gamma knife Procedure November 2013    . ORIF ANKLE FRACTURE Right 06/03/2016   Procedure: OPEN REDUCTION INTERNAL FIXATION (ORIF) RIGHT ANKLE;  Surgeon: Carole Civil, MD;  Location: [facility]  ORS;  Service: Orthopedics;  Laterality: Right;  . TONSILLECTOMY      Family History  Problem Relation Age of Onset  . Depression Paternal Aunt   . Depression Paternal Uncle   . Depression Maternal Grandfather   . Ovarian cancer Maternal Grandfather   . Uterine cancer Mother   . Breast cancer Mother   . Lung cancer Father        x3  . Colon cancer Father   . Heart attack Father   . Hypertension Father   . Hyperlipidemia Father   . Leukemia Paternal Grandmother     Social History:  reports that  has never smoked. she has never used smokeless tobacco. She reports that she does not drink alcohol or use drugs.  Allergies:  Allergies  Allergen Reactions  . Baclofen Other (See Comments)    Seizure activity  . Lorazepam Other (See Comments)    Doesn't tolerate    Medications: I have reviewed the patient's current medications.  Results for orders placed or performed during the hospital encounter of 09/27/17 (from the past 48 hour(s))  Ethanol     Status: None   Collection Time: 09/27/17  2:33 AM  Result Value Ref Range   Alcohol, Ethyl (B) <10 <10 mg/dL    Comment:        LOWEST DETECTABLE LIMIT FOR SERUM ALCOHOL IS 10 mg/dL FOR MEDICAL PURPOSES ONLY   Protime-INR     Status: None   Collection Time: 09/27/17  2:33 AM  Result Value Ref Range   Prothrombin Time 12.4 11.4 - 15.2 seconds   INR 0.93   APTT      Status: None   Collection Time: 09/27/17  2:33 AM  Result Value Ref Range   aPTT 28 24 - 36 seconds  CBC     Status: None   Collection Time: 09/27/17  2:33 AM  Result Value Ref Range   WBC 9.5 4.0 - 10.5 K/uL   RBC 4.53 3.87 - 5.11 MIL/uL   Hemoglobin 14.4 12.0 - 15.0 g/dL   HCT 43.1 36.0 - 46.0 %   MCV 95.1 78.0 - 100.0 fL   MCH 31.8 26.0 - 34.0 pg   MCHC 33.4 30.0 - 36.0 g/dL   RDW 13.0 11.5 - 15.5 %   Platelets 360 150 - 400 K/uL  Differential     Status: Abnormal   Collection Time: 09/27/17  2:33 AM  Result Value Ref Range   Neutrophils Relative % 46 %   Neutro Abs 4.3 1.7 - 7.7 K/uL   Lymphocytes Relative 47 %   Lymphs Abs 4.4 (H) 0.7 - 4.0 K/uL   Monocytes Relative 5 %   Monocytes Absolute 0.5 0.1 - 1.0 K/uL   Eosinophils Relative 2 %   Eosinophils Absolute 0.2 0.0 - 0.7 K/uL   Basophils Relative 0 %   Basophils Absolute 0.0 0.0 - 0.1 K/uL  Comprehensive metabolic panel     Status: Abnormal   Collection Time: 09/27/17  2:33 AM  Result Value Ref Range   Sodium 134 (L) 135 - 145 mmol/L   Potassium 4.5 3.5 - 5.1 mmol/L   Chloride 98 (L) 101 - 111 mmol/L   CO2 28 22 - 32 mmol/L   Glucose, Bld 115 (H) 65 - 99 mg/dL   BUN 15 6 - 20 mg/dL   Creatinine, Ser 1.10 (H) 0.44 - 1.00 mg/dL   Calcium 10.1 8.9 - 10.3 mg/dL   Total Protein 7.9 6.5 - 8.1 g/dL  Albumin 4.0 3.5 - 5.0 g/dL   AST 26 15 - 41 U/L   ALT 16 14 - 54 U/L   Alkaline Phosphatase 34 (L) 38 - 126 U/L   Total Bilirubin 0.7 0.3 - 1.2 mg/dL   GFR calc non Af Amer 59 (L) >60 mL/min   GFR calc Af Amer >60 >60 mL/min    Comment: (NOTE) The eGFR has been calculated using the CKD EPI equation. This calculation has not been validated in all clinical situations. eGFR's persistently <60 mL/min signify possible Chronic Kidney Disease.    Anion gap 8 5 - 15  Magnesium     Status: None   Collection Time: 09/27/17  2:33 AM  Result Value Ref Range   Magnesium 2.1 1.7 - 2.4 mg/dL  hCG, quantitative, pregnancy      Status: Abnormal   Collection Time: 09/27/17  2:33 AM  Result Value Ref Range   hCG, Beta Chain, Quant, S 6 (H) <5 mIU/mL    Comment:          GEST. AGE      CONC.  (mIU/mL)   <=1 WEEK        5 - 50     2 WEEKS       50 - 500     3 WEEKS       100 - 10,000     4 WEEKS     1,000 - 30,000     5 WEEKS     3,500 - 115,000   6-8 WEEKS     12,000 - 270,000    12 WEEKS     15,000 - 220,000        FEMALE AND NON-PREGNANT FEMALE:     LESS THAN 5 mIU/mL   I-Stat Beta hCG blood, ED ([facility], [facility], [facility] only)     Status: Abnormal   Collection Time: 09/27/17  2:50 AM  Result Value Ref Range   I-stat hCG, quantitative 10.9 (H) <5 mIU/mL   Comment 3            Comment:   GEST. AGE      CONC.  (mIU/mL)   <=1 WEEK        5 - 50     2 WEEKS       50 - 500     3 WEEKS       100 - 10,000     4 WEEKS     1,000 - 30,000        FEMALE AND NON-PREGNANT FEMALE:     LESS THAN 5 mIU/mL   I-stat chem 8, ed     Status: Abnormal   Collection Time: 09/27/17  2:52 AM  Result Value Ref Range   Sodium 136 135 - 145 mmol/L   Potassium 4.2 3.5 - 5.1 mmol/L   Chloride 97 (L) 101 - 111 mmol/L   BUN 15 6 - 20 mg/dL   Creatinine, Ser 1.10 (H) 0.44 - 1.00 mg/dL   Glucose, Bld 110 (H) 65 - 99 mg/dL   Calcium, Ion 1.24 1.15 - 1.40 mmol/L   TCO2 29 22 - 32 mmol/L   Hemoglobin 15.0 12.0 - 15.0 g/dL   HCT 44.0 36.0 - 46.0 %  I-stat troponin, ED     Status: None   Collection Time: 09/27/17  3:02 AM  Result Value Ref Range   Troponin i, poc 0.00 0.00 - 0.08 ng/mL   Comment 3  Comment: Due to the release kinetics of cTnI, a negative result within the first hours of the onset of symptoms does not rule out myocardial infarction with certainty. If myocardial infarction is still suspected, repeat the test at appropriate intervals.   Urine rapid drug screen (hosp performed)     Status: Abnormal   Collection Time: 09/27/17  3:40 AM  Result Value Ref Range   Opiates NONE DETECTED NONE DETECTED   Cocaine NONE  DETECTED NONE DETECTED   Benzodiazepines POSITIVE (A) NONE DETECTED   Amphetamines NONE DETECTED NONE DETECTED   Tetrahydrocannabinol NONE DETECTED NONE DETECTED   Barbiturates NONE DETECTED NONE DETECTED    Comment:        DRUG SCREEN FOR MEDICAL PURPOSES ONLY.  IF CONFIRMATION IS NEEDED FOR ANY PURPOSE, NOTIFY LAB WITHIN 5 DAYS.        LOWEST DETECTABLE LIMITS FOR URINE DRUG SCREEN Drug Class       Cutoff (ng/mL) Amphetamine      1000 Barbiturate      200 Benzodiazepine   546 Tricyclics       503 Opiates          300 Cocaine          300 THC              50   Urinalysis, Routine w reflex microscopic     Status: Abnormal   Collection Time: 09/27/17  3:40 AM  Result Value Ref Range   Color, Urine YELLOW YELLOW   APPearance HAZY (A) CLEAR   Specific Gravity, Urine 1.004 (L) 1.005 - 1.030   pH 8.0 5.0 - 8.0   Glucose, UA NEGATIVE NEGATIVE mg/dL   Hgb urine dipstick NEGATIVE NEGATIVE   Bilirubin Urine NEGATIVE NEGATIVE   Ketones, ur NEGATIVE NEGATIVE mg/dL   Protein, ur NEGATIVE NEGATIVE mg/dL   Nitrite NEGATIVE NEGATIVE   Leukocytes, UA MODERATE (A) NEGATIVE   RBC / HPF 0-5 0 - 5 RBC/hpf   WBC, UA 6-30 0 - 5 WBC/hpf   Bacteria, UA RARE (A) NONE SEEN   Squamous Epithelial / LPF 6-30 (A) NONE SEEN   Trans Epithel, UA 1   MRSA PCR Screening     Status: None   Collection Time: 09/27/17  6:25 AM  Result Value Ref Range   MRSA by PCR NEGATIVE NEGATIVE    Comment:        The GeneXpert MRSA Assay (FDA approved for NASAL specimens only), is one component of a comprehensive MRSA colonization surveillance program. It is not intended to diagnose MRSA infection nor to guide or monitor treatment for MRSA infections.   Glucose, capillary     Status: Abnormal   Collection Time: 09/27/17  8:11 AM  Result Value Ref Range   Glucose-Capillary 102 (H) 65 - 99 mg/dL  CK     Status: Abnormal   Collection Time: 09/27/17  9:05 AM  Result Value Ref Range   Total CK 33 (L) 38 - 234  U/L  Lactic acid, plasma     Status: None   Collection Time: 09/27/17  9:05 AM  Result Value Ref Range   Lactic Acid, Venous 1.4 0.5 - 1.9 mmol/L  TSH     Status: None   Collection Time: 09/27/17  9:05 AM  Result Value Ref Range   TSH 2.849 0.350 - 4.500 uIU/mL    Comment: Performed by a 3rd Generation assay with a functional sensitivity of <=0.01 uIU/mL.  T4, free  Status: None   Collection Time: 09/27/17  9:05 AM  Result Value Ref Range   Free T4 0.68 0.61 - 1.12 ng/dL    Comment: (NOTE) Biotin ingestion may interfere with free T4 tests. If the results are inconsistent with the TSH level, previous test results, or the clinical presentation, then consider biotin interference. If needed, order repeat testing after stopping biotin. Performed at [facility], [place] [address], [place], [place] [address]   Lipase, blood     Status: None   Collection Time: 09/27/17  9:05 AM  Result Value Ref Range   Lipase 23 11 - 51 U/L  Culture, blood (Routine X 2) w Reflex to ID Panel     Status: None (Preliminary result)   Collection Time: 09/27/17 12:38 PM  Result Value Ref Range   Specimen Description BLOOD RIGHT HAND    Special Requests      BOTTLES DRAWN AEROBIC AND ANAEROBIC Blood Culture adequate volume   Culture NO GROWTH < 24 HOURS    Report Status PENDING   Glucose, capillary     Status: Abnormal   Collection Time: 09/27/17 12:49 PM  Result Value Ref Range   Glucose-Capillary 101 (H) 65 - 99 mg/dL  Culture, blood (Routine X 2) w Reflex to ID Panel     Status: None (Preliminary result)   Collection Time: 09/27/17 12:50 PM  Result Value Ref Range   Specimen Description BLOOD RIGHT HAND    Special Requests      BOTTLES DRAWN AEROBIC ONLY Blood Culture results may not be optimal due to an inadequate volume of blood received in culture bottles   Culture NO GROWTH < 24 HOURS    Report Status PENDING   Glucose, capillary     Status: Abnormal   Collection Time: 09/27/17  4:00 PM   Result Value Ref Range   Glucose-Capillary 104 (H) 65 - 99 mg/dL  Urinalysis, Complete w Microscopic     Status: Abnormal   Collection Time: 09/27/17  7:00 PM  Result Value Ref Range   Color, Urine YELLOW YELLOW   APPearance CLEAR CLEAR   Specific Gravity, Urine 1.004 (L) 1.005 - 1.030   pH 8.0 5.0 - 8.0   Glucose, UA NEGATIVE NEGATIVE mg/dL   Hgb urine dipstick NEGATIVE NEGATIVE   Bilirubin Urine NEGATIVE NEGATIVE   Ketones, ur NEGATIVE NEGATIVE mg/dL   Protein, ur NEGATIVE NEGATIVE mg/dL   Nitrite NEGATIVE NEGATIVE   Leukocytes, UA SMALL (A) NEGATIVE   RBC / HPF 0-5 0 - 5 RBC/hpf   WBC, UA 0-5 0 - 5 WBC/hpf   Bacteria, UA NONE SEEN NONE SEEN   Squamous Epithelial / LPF 0-5 (A) NONE SEEN   Trans Epithel, UA <1   Glucose, capillary     Status: Abnormal   Collection Time: 09/27/17  9:37 PM  Result Value Ref Range   Glucose-Capillary 111 (H) 65 - 99 mg/dL  Basic metabolic panel     Status: Abnormal   Collection Time: 09/28/17  5:39 AM  Result Value Ref Range   Sodium 139 135 - 145 mmol/L   Potassium 3.9 3.5 - 5.1 mmol/L   Chloride 109 101 - 111 mmol/L   CO2 20 (L) 22 - 32 mmol/L   Glucose, Bld 76 65 - 99 mg/dL   BUN 8 6 - 20 mg/dL   Creatinine, Ser 0.98 0.44 - 1.00 mg/dL   Calcium 9.0 8.9 - 10.3 mg/dL   GFR calc non Af Amer >60 >60  mL/min   GFR calc Af Amer >60 >60 mL/min    Comment: (NOTE) The eGFR has been calculated using the CKD EPI equation. This calculation has not been validated in all clinical situations. eGFR's persistently <60 mL/min signify possible Chronic Kidney Disease.    Anion gap 10 5 - 15  CBC     Status: None   Collection Time: 09/28/17  5:39 AM  Result Value Ref Range   WBC 10.0 4.0 - 10.5 K/uL   RBC 4.16 3.87 - 5.11 MIL/uL   Hemoglobin 13.1 12.0 - 15.0 g/dL   HCT 40.6 36.0 - 46.0 %   MCV 97.6 78.0 - 100.0 fL   MCH 31.5 26.0 - 34.0 pg   MCHC 32.3 30.0 - 36.0 g/dL   RDW 13.5 11.5 - 15.5 %   Platelets 278 150 - 400 K/uL    Ct Head Wo  Contrast  Result Date: 09/27/2017 CLINICAL DATA:  Seizure and fall EXAM: CT HEAD WITHOUT CONTRAST CT CERVICAL SPINE WITHOUT CONTRAST TECHNIQUE: Multidetector CT imaging of the head and cervical spine was performed following the standard protocol without intravenous contrast. Multiplanar CT image reconstructions of the cervical spine were also generated. COMPARISON:  11/02/2016 FINDINGS: CT HEAD FINDINGS Brain: No acute territorial infarction, hemorrhage or intracranial mass is visualized. Small amount of encephalomalacia in the left cerebellum nonenlarged ventricles. Vascular: No hyperdense vessels.  No unexpected calcification Skull: No fracture. Left occipital craniectomy with hyperdense material and metallic plate as before. Sinuses/Orbits: Mild mucosal thickening in the ethmoid and sphenoid sinuses. No acute orbital abnormality Other: None CT CERVICAL SPINE FINDINGS Alignment: Straightening of the cervical spine. No subluxation. Facet alignment within normal limits Skull base and vertebrae: No acute fracture. No primary bone lesion or focal pathologic process. Soft tissues and spinal canal: No prevertebral fluid or swelling. No visible canal hematoma. Disc levels:  Mild degenerative changes at C5-C6 and C6-C7 Upper chest: Negative. Other: None IMPRESSION: 1. No CT evidence for acute intracranial abnormality. Stable left occipital postsurgical changes 2. Straightening of the cervical spine.  No acute fracture seen Electronically Signed   By: Donavan Foil M.D.   On: 09/27/2017 03:51   Ct Cervical Spine Wo Contrast  Result Date: 09/27/2017 CLINICAL DATA:  Seizure and fall EXAM: CT HEAD WITHOUT CONTRAST CT CERVICAL SPINE WITHOUT CONTRAST TECHNIQUE: Multidetector CT imaging of the head and cervical spine was performed following the standard protocol without intravenous contrast. Multiplanar CT image reconstructions of the cervical spine were also generated. COMPARISON:  11/02/2016 FINDINGS: CT HEAD FINDINGS  Brain: No acute territorial infarction, hemorrhage or intracranial mass is visualized. Small amount of encephalomalacia in the left cerebellum nonenlarged ventricles. Vascular: No hyperdense vessels.  No unexpected calcification Skull: No fracture. Left occipital craniectomy with hyperdense material and metallic plate as before. Sinuses/Orbits: Mild mucosal thickening in the ethmoid and sphenoid sinuses. No acute orbital abnormality Other: None CT CERVICAL SPINE FINDINGS Alignment: Straightening of the cervical spine. No subluxation. Facet alignment within normal limits Skull base and vertebrae: No acute fracture. No primary bone lesion or focal pathologic process. Soft tissues and spinal canal: No prevertebral fluid or swelling. No visible canal hematoma. Disc levels:  Mild degenerative changes at C5-C6 and C6-C7 Upper chest: Negative. Other: None IMPRESSION: 1. No CT evidence for acute intracranial abnormality. Stable left occipital postsurgical changes 2. Straightening of the cervical spine.  No acute fracture seen Electronically Signed   By: Donavan Foil M.D.   On: 09/27/2017 03:51   Mr Jeri Cos ZJ  Contrast  Result Date: 09/27/2017 CLINICAL DATA:  46 year old female status post seizure and fall. Headache. Personal history of trigeminal neuralgia decompression in 2009. EXAM: MRI HEAD WITHOUT AND WITH CONTRAST TECHNIQUE: Multiplanar, multiecho pulse sequences of the brain and surrounding structures were obtained without and with intravenous contrast. CONTRAST:  30m MULTIHANCE GADOBENATE DIMEGLUMINE 529 MG/ML IV SOLN COMPARISON:  Head and cervical spine CT 0316 hours today. [facility] FINDINGS: Brain: Cerebral volume is stable and within normal limits. No restricted diffusion to suggest acute infarction. No midline shift, mass effect, evidence of mass lesion, ventriculomegaly, extra-axial collection or acute intracranial hemorrhage. Cervicomedullary junction and pituitary  are within normal limits. Sequelae of left suboccipital craniectomy as seen on the CT earlier today. A small wedge-shaped area of chronic abnormal T2 and FLAIR hyperintensity in the left lateral cerebellum is unchanged. Elsewhere gray and white matter signal appears normal for age. Thin slice coronal T2 weighted images show bilateral hippocampal formations signal and morphology to be symmetric and within normal limits. No abnormal enhancement identified. No dural thickening. Vascular: Major intracranial vascular flow voids are stable since 2016 and within normal limits. Skull and upper cervical spine: Negative visualized cervical spine. Stable visualized osseous structures. Sinuses/Orbits: Normal orbits soft tissues. Mild posterior ethmoid and sphenoid sinus mucosal thickening is new since 2016. Other paranasal sinuses remain clear. Other: Mastoid air cells are clear. Visible internal auditory structures appear normal. Mild postoperative changes to the left posterior suboccipital scalp soft tissues. IMPRESSION: 1.  No acute intracranial abnormality. 2. Stable and normal MRI appearance of the brain aside from chronic left posterior fossa postoperative changes with mild left lateral cerebellar malacia. Electronically Signed   By: HGenevie AnnM.D.   On: 09/27/2017 12:45    ROS Blood pressure 111/82, pulse 62, temperature 98.1 F (36.7 C), temperature source Oral, resp. rate 19, height '5\' 2"'  (1.575 m), weight 146 lb 2.6 oz (66.3 kg), last menstrual period 11/01/2013, SpO2 99 %. Physical Exam Alert and oriented. Skin warm and dry. Oral mucosa is moist.   . Sclera anicteric, conjunctivae is pink. Thyroid not enlarged. No cervical lymphadenopathy. Lungs clear. Heart regular rate and rhythm.  Abdomen is soft. Bowel sounds are positive. No hepatomegaly. No abdominal masses felt. LLQ tenderness which she says is chronic.  No edema to lower extremities   Assessment/Plan: BRRB in the setting of constipation. No further  episodes. Hemoglobin appears stable. There is a family hx of colon cancer in a father in his 520s Will discuss with Dr. RLaural Golden  Patient has cardiology consult in place.   Kysha Muralles W 09/28/2017, 7:48 AM

## 2017-09-28 NOTE — Progress Notes (Signed)
PT heart rate sustained less than 40 x 7 mins.  Atropine 0.5mg  given per order. Pt heart rate returned to 65.  MD notified.

## 2017-09-28 NOTE — Procedures (Signed)
[facility] Caleel Kiner A. Gerilyn Pilgrimoonquah, MD     [email or web address]           HISTORY: The patient presents with multiple episodes of loss of consciousness, syncope and balance problems.  There is a baseline history of seizures.  The study is being done to evaluate for seizures as etiology of these events.  MEDICATIONS: Scheduled Meds: . cholecalciferol  1,000 Units Oral QID  . citalopram  40 mg Oral Daily  . gabapentin  400 mg Oral TID  . hydrocortisone  25 mg Rectal BID  . levETIRAcetam  1,500 mg Oral BID  . polyvinyl alcohol  1-2 drop Both Eyes Daily  . venlafaxine XR  150 mg Oral Q breakfast   Continuous Infusions: . sodium chloride 100 mL/hr at 09/27/17 2321   PRN Meds:.atropine, magnesium hydroxide, ondansetron (ZOFRAN) IV  Prior to Admission medications   Medication Sig Start Date End Date Taking? Authorizing Provider  atorvastatin (LIPITOR) 80 MG tablet Take 80 mg by mouth daily.   Yes [provider]  cholecalciferol (VITAMIN D) 1000 units tablet Take 1,000 Units by mouth 4 (four) times daily.   Yes [provider]  citalopram (CELEXA) 40 MG tablet Take 40 mg by mouth daily.   Yes [provider]  diazepam (VALIUM) 5 MG tablet Take 5 mg by mouth every 12 (twelve) hours as needed for anxiety.   Yes [provider]  gabapentin (NEURONTIN) 400 MG capsule Take 400 mg by mouth 3 (three) times daily.   Yes [provider]  levETIRAcetam (KEPPRA) 750 MG tablet Take 1,500 mg by mouth 2 (two) times daily.   Yes [provider]  lidocaine (XYLOCAINE) 2 % jelly Apply 1 application topically as needed (pain).    Yes [provider]  lisinopril-hydrochlorothiazide (PRINZIDE,ZESTORETIC) 20-25 MG tablet Take 1 tablet by mouth daily.   Yes [provider]  magnesium hydroxide (MILK OF MAGNESIA) 400 MG/5ML suspension Take 15 mLs by mouth daily as needed for mild constipation.   Yes [provider]    magnesium oxide (MAG-OX) 400 MG tablet Take 400 mg by mouth daily.   Yes [provider]  mirtazapine (REMERON) 15 MG tablet Take 30 mg by mouth at bedtime.    Yes [provider]  pioglitazone (ACTOS) 15 MG tablet Take 15 mg by mouth daily.   Yes [provider]  polyvinyl alcohol-povidone (REFRESH) 1.4-0.6 % ophthalmic solution Apply 1-2 drops to eye daily.    Yes [provider]  promethazine (PHENERGAN) 25 MG suppository Place 25 mg rectally every 6 (six) hours as needed for nausea or vomiting.   Yes [provider]  tiZANidine (ZANAFLEX) 4 MG tablet Take 4 mg by mouth 2 (two) times daily.    Yes [provider]  venlafaxine XR (EFFEXOR-XR) 150 MG 24 hr capsule Take 150 mg by mouth daily with breakfast.   Yes [provider]  zolpidem (AMBIEN) 10 MG tablet Take 10 mg by mouth at bedtime as needed for sleep.   Yes [provider]      ANALYSIS: A 16 channel recording using standard 10 20 measurements is conducted for 21 minutes.   The background activity gets as high as 10 hertz bilaterally.  There is beta activity observed in the frontal areas.  Awake and drowsy activities are observed.  Photic stimulation and hyperventilation are not conducted.  There is no focal slowing.  There is no lateral slowing.  There is no epileptiform activity is observed.  IMPRESSION: 1.   This is a normal recording of the awake and drowsy states.      Berlie Persky A. Gerilyn Pilgrimoonquah, M.D.  Diplomate, Biomedical engineerAmerican Board of Psychiatry and Neurology ( Neurology).

## 2017-09-28 NOTE — Consult Note (Signed)
[place] A. Merlene Laughter, MD     [email or web address]          Mariah Ibarra is an 46 y.o. female.   ASSESSMENT/PLAN: The patient is consistent with the patient developing marked gastrointestinal symptoms associated with nausea vomiting.  She was not a particular medications down and seemed to have developed multiple seizures, syncope, dehydration and other symptoms.  She has been restarted back on her seizure medications. Agree with Restarting Keppra at same dose  Symptomatic bradycardia due to the above:  Baseline seizure disorder:  Recalcitrant trigeminal neuralgia status post multiple procedures:  Chronic pain syndrome:  Chronic MRI changes from multiple brain procedures:  Postoperative cerebellar stroke:  The patient is a 46 year old white female who presented with about a week history of significant nausea and vomiting.  She was not able to keep her medications down.  She subsequently started having continues slurring of her speech associated with multiple episodes of passing out.  She has a baseline history of seizures and started having some seizures.  She has a longstanding history of trigeminal neuralgia status post multiple procedures.  We are seeing her in the office for years.  She is also being followed at the [facility] and [facility].  She is also has been treated at [facility] for her problems.  She has tried multiple medications for problems.  She has been prescribed hydromorphone by the [facility].  She tells me that she was quite drowsy with this medication although she has been on it for quite awhile.  She reports that she did not like the way it made her feel and therefore she discontinued this medication about two months ago.  She has lost a lot a weight.  She reports losing over 100 lb after she got of the hydromorphone and cutting back on calories from fluids/liquids.   GENERAL:  The patient has lost lot of weight  since she was last seen several months ago.  She is in discomfort but no acute distress.  HEENT:  Neck is supple head is normocephalic and atraumatic.  ABDOMEN: Soft  EXTREMITIES: No edema   BACK: Normal alignment.  SKIN: Normal by inspection.    MENTAL STATUS: Alert and oriented.  She immediately recognizes may stated my full name.  Speech, language and cognition are generally intact. Judgment and insight normal.   CRANIAL NERVES: Pupils are equal, round and reactive to light and accommodation; extraocular movements are full, there is no significant nystagmus; upper and lower facial muscles are normal in strength and symmetric, there is no flattening of the nasolabial folds; tongue is midline; uvula is midline; shoulder elevation is normal.  MOTOR: Normal tone, bulk and strength; no pronator drift.  COORDINATION: Left finger to nose is normal, right finger to nose is normal, No rest tremor; no intention tremor; no postural tremor; no bradykinesia.  REFLEXES: Deep tendon reflexes are symmetrical and normal.   SENSATION: Normal to light touch and temperature.    Blood pressure 111/82, pulse (!) 55, temperature 97.6 F (36.4 C), temperature source Axillary, resp. rate 15, height '5\' 2"'  (1.575 m), weight 146 lb 2.6 oz (66.3 kg), last menstrual period 11/01/2013, SpO2 97 %.  Past Medical History:  Diagnosis Date  . Anxiety   . Hypercholesteremia   . Hypertension   . Panic attacks   . PTSD (post-traumatic stress disorder)   . Seizures ([place])   . Stroke [facility])    Short term memory loss from  3rd brain surgery.  . Trigeminal neuralgia   . Type 2 diabetes mellitus ([place])     Past Surgical History:  Procedure Laterality Date  . ABDOMINAL HYSTERECTOMY    . ABDOMINOPLASTY    . APPENDECTOMY    . Bladder tack    . BRAIN SURGERY     x4  . CERVICAL ABLATION    . ESOPHAGOGASTRODUODENOSCOPY N/A 01/13/2017   Procedure: ESOPHAGOGASTRODUODENOSCOPY (EGD);  Surgeon: Rogene Houston, MD;   Location: [facility] ENDO SUITE;  Service: Endoscopy;  Laterality: N/A;  3:00  . Gamma knife Procedure November 2013    . ORIF ANKLE FRACTURE Right 06/03/2016   Procedure: OPEN REDUCTION INTERNAL FIXATION (ORIF) RIGHT ANKLE;  Surgeon: Carole Civil, MD;  Location: [facility] ORS;  Service: Orthopedics;  Laterality: Right;  . TONSILLECTOMY      Family History  Problem Relation Age of Onset  . Depression Paternal Aunt   . Depression Paternal Uncle   . Depression Maternal Grandfather   . Ovarian cancer Maternal Grandfather   . Uterine cancer Mother   . Breast cancer Mother   . Lung cancer Father        x3  . Colon cancer Father   . Heart attack Father   . Hypertension Father   . Hyperlipidemia Father   . Leukemia Paternal Grandmother     Social History:  reports that  has never smoked. she has never used smokeless tobacco. She reports that she does not drink alcohol or use drugs.  Allergies:  Allergies  Allergen Reactions  . Baclofen Other (See Comments)    Seizure activity  . Lorazepam Other (See Comments)    Doesn't tolerate    Medications: Prior to Admission medications   Medication Sig Start Date End Date Taking? Authorizing Provider  atorvastatin (LIPITOR) 80 MG tablet Take 80 mg by mouth daily.   Yes [provider]  cholecalciferol (VITAMIN D) 1000 units tablet Take 1,000 Units by mouth 4 (four) times daily.   Yes [provider]  citalopram (CELEXA) 40 MG tablet Take 40 mg by mouth daily.   Yes [provider]  diazepam (VALIUM) 5 MG tablet Take 5 mg by mouth every 12 (twelve) hours as needed for anxiety.   Yes [provider]  gabapentin (NEURONTIN) 400 MG capsule Take 400 mg by mouth 3 (three) times daily.   Yes [provider]  levETIRAcetam (KEPPRA) 750 MG tablet Take 1,500 mg by mouth 2 (two) times daily.   Yes [provider]  lidocaine (XYLOCAINE) 2 % jelly Apply 1 application topically as needed (pain).    Yes [provider]  lisinopril-hydrochlorothiazide (PRINZIDE,ZESTORETIC) 20-25 MG tablet Take 1 tablet by mouth daily.   Yes [provider]  magnesium hydroxide (MILK OF MAGNESIA) 400 MG/5ML suspension Take 15 mLs by mouth daily as needed for mild constipation.   Yes [provider]  magnesium oxide (MAG-OX) 400 MG tablet Take 400 mg by mouth daily.   Yes [provider]  mirtazapine (REMERON) 15 MG tablet Take 30 mg by mouth at bedtime.    Yes [provider]  pioglitazone (ACTOS) 15 MG tablet Take 15 mg by mouth daily.   Yes [provider]  polyvinyl alcohol-povidone (REFRESH) 1.4-0.6 % ophthalmic solution Apply 1-2 drops to eye daily.    Yes [provider]  promethazine (PHENERGAN) 25 MG suppository Place 25 mg rectally every 6 (six) hours as needed for nausea or vomiting.   Yes [provider]  tiZANidine (ZANAFLEX) 4 MG tablet Take 4 mg by mouth 2 (two) times daily.    Yes [provider]  venlafaxine XR (EFFEXOR-XR) 150 MG 24 hr capsule Take 150 mg by mouth daily with breakfast.   Yes [provider]  zolpidem (AMBIEN) 10 MG tablet Take 10 mg by mouth at bedtime as needed for sleep.   Yes [provider]    Scheduled Meds: . cholecalciferol  1,000 Units Oral QID  . citalopram  40 mg Oral Daily  . gabapentin  400 mg Oral TID  . hydrocortisone  25 mg Rectal BID  . levETIRAcetam  1,500 mg Oral BID  . polyvinyl alcohol  1-2 drop Both Eyes Daily  . venlafaxine XR  150 mg Oral Q breakfast   Continuous Infusions: . sodium chloride 100 mL/hr at 09/27/17 2321   PRN Meds:.atropine, magnesium hydroxide, ondansetron (ZOFRAN) IV     Results for orders placed or performed during the hospital encounter of 09/27/17 (from the past 48 hour(s))  Ethanol     Status: None   Collection Time: 09/27/17  2:33 AM  Result Value Ref Range   Alcohol, Ethyl (B) <10 <10 mg/dL    Comment:        LOWEST DETECTABLE LIMIT  FOR SERUM ALCOHOL IS 10 mg/dL FOR MEDICAL PURPOSES ONLY   Protime-INR     Status: None   Collection Time: 09/27/17  2:33 AM  Result Value Ref Range   Prothrombin Time 12.4 11.4 - 15.2 seconds   INR 0.93   APTT     Status: None   Collection Time: 09/27/17  2:33 AM  Result Value Ref Range   aPTT 28 24 - 36 seconds  CBC     Status: None   Collection Time: 09/27/17  2:33 AM  Result Value Ref Range   WBC 9.5 4.0 - 10.5 K/uL   RBC 4.53 3.87 - 5.11 MIL/uL   Hemoglobin 14.4 12.0 - 15.0 g/dL   HCT 43.1 36.0 - 46.0 %   MCV 95.1 78.0 - 100.0 fL   MCH 31.8 26.0 - 34.0 pg   MCHC 33.4 30.0 - 36.0 g/dL   RDW 13.0 11.5 - 15.5 %   Platelets 360 150 - 400 K/uL  Differential     Status: Abnormal   Collection Time: 09/27/17  2:33 AM  Result Value Ref Range   Neutrophils Relative % 46 %   Neutro Abs 4.3 1.7 - 7.7 K/uL   Lymphocytes Relative 47 %   Lymphs Abs 4.4 (H) 0.7 - 4.0 K/uL   Monocytes Relative 5 %   Monocytes Absolute 0.5 0.1 - 1.0 K/uL   Eosinophils Relative 2 %   Eosinophils Absolute 0.2 0.0 - 0.7 K/uL   Basophils Relative 0 %   Basophils Absolute 0.0 0.0 - 0.1 K/uL  Comprehensive metabolic panel     Status: Abnormal   Collection Time: 09/27/17  2:33 AM  Result Value Ref Range   Sodium 134 (L) 135 - 145 mmol/L   Potassium 4.5 3.5 - 5.1 mmol/L   Chloride 98 (L) 101 - 111 mmol/L   CO2 28 22 - 32 mmol/L   Glucose, Bld 115 (H) 65 - 99 mg/dL   BUN 15 6 - 20 mg/dL   Creatinine, Ser 1.10 (H) 0.44 - 1.00 mg/dL   Calcium 10.1 8.9 - 10.3 mg/dL   Total Protein 7.9 6.5 - 8.1 g/dL   Albumin 4.0 3.5 - 5.0 g/dL   AST 26 15 - 41  U/L   ALT 16 14 - 54 U/L   Alkaline Phosphatase 34 (L) 38 - 126 U/L   Total Bilirubin 0.7 0.3 - 1.2 mg/dL   GFR calc non Af Amer 59 (L) >60 mL/min   GFR calc Af Amer >60 >60 mL/min    Comment: (NOTE) The eGFR has been calculated using the CKD EPI equation. This calculation has not been validated in all clinical situations. eGFR's persistently <60 mL/min  signify possible Chronic Kidney Disease.    Anion gap 8 5 - 15  Magnesium     Status: None   Collection Time: 09/27/17  2:33 AM  Result Value Ref Range   Magnesium 2.1 1.7 - 2.4 mg/dL  hCG, quantitative, pregnancy     Status: Abnormal   Collection Time: 09/27/17  2:33 AM  Result Value Ref Range   hCG, Beta Chain, Quant, S 6 (H) <5 mIU/mL    Comment:          GEST. AGE      CONC.  (mIU/mL)   <=1 WEEK        5 - 50     2 WEEKS       50 - 500     3 WEEKS       100 - 10,000     4 WEEKS     1,000 - 30,000     5 WEEKS     3,500 - 115,000   6-8 WEEKS     12,000 - 270,000    12 WEEKS     15,000 - 220,000        FEMALE AND NON-PREGNANT FEMALE:     LESS THAN 5 mIU/mL   I-Stat Beta hCG blood, ED ([facility], [facility], [facility] only)     Status: Abnormal   Collection Time: 09/27/17  2:50 AM  Result Value Ref Range   I-stat hCG, quantitative 10.9 (H) <5 mIU/mL   Comment 3            Comment:   GEST. AGE      CONC.  (mIU/mL)   <=1 WEEK        5 - 50     2 WEEKS       50 - 500     3 WEEKS       100 - 10,000     4 WEEKS     1,000 - 30,000        FEMALE AND NON-PREGNANT FEMALE:     LESS THAN 5 mIU/mL   I-stat chem 8, ed     Status: Abnormal   Collection Time: 09/27/17  2:52 AM  Result Value Ref Range   Sodium 136 135 - 145 mmol/L   Potassium 4.2 3.5 - 5.1 mmol/L   Chloride 97 (L) 101 - 111 mmol/L   BUN 15 6 - 20 mg/dL   Creatinine, Ser 1.10 (H) 0.44 - 1.00 mg/dL   Glucose, Bld 110 (H) 65 - 99 mg/dL   Calcium, Ion 1.24 1.15 - 1.40 mmol/L   TCO2 29 22 - 32 mmol/L   Hemoglobin 15.0 12.0 - 15.0 g/dL   HCT 44.0 36.0 - 46.0 %  I-stat troponin, ED     Status: None   Collection Time: 09/27/17  3:02 AM  Result Value Ref Range   Troponin i, poc 0.00 0.00 - 0.08 ng/mL   Comment 3            Comment: Due to the release kinetics of cTnI,  a negative result within the first hours of the onset of symptoms does not rule out myocardial infarction with certainty. If myocardial infarction is still suspected, repeat  the test at appropriate intervals.   Urine rapid drug screen (hosp performed)     Status: Abnormal   Collection Time: 09/27/17  3:40 AM  Result Value Ref Range   Opiates NONE DETECTED NONE DETECTED   Cocaine NONE DETECTED NONE DETECTED   Benzodiazepines POSITIVE (A) NONE DETECTED   Amphetamines NONE DETECTED NONE DETECTED   Tetrahydrocannabinol NONE DETECTED NONE DETECTED   Barbiturates NONE DETECTED NONE DETECTED    Comment:        DRUG SCREEN FOR MEDICAL PURPOSES ONLY.  IF CONFIRMATION IS NEEDED FOR ANY PURPOSE, NOTIFY LAB WITHIN 5 DAYS.        LOWEST DETECTABLE LIMITS FOR URINE DRUG SCREEN Drug Class       Cutoff (ng/mL) Amphetamine      1000 Barbiturate      200 Benzodiazepine   694 Tricyclics       854 Opiates          300 Cocaine          300 THC              50   Urinalysis, Routine w reflex microscopic     Status: Abnormal   Collection Time: 09/27/17  3:40 AM  Result Value Ref Range   Color, Urine YELLOW YELLOW   APPearance HAZY (A) CLEAR   Specific Gravity, Urine 1.004 (L) 1.005 - 1.030   pH 8.0 5.0 - 8.0   Glucose, UA NEGATIVE NEGATIVE mg/dL   Hgb urine dipstick NEGATIVE NEGATIVE   Bilirubin Urine NEGATIVE NEGATIVE   Ketones, ur NEGATIVE NEGATIVE mg/dL   Protein, ur NEGATIVE NEGATIVE mg/dL   Nitrite NEGATIVE NEGATIVE   Leukocytes, UA MODERATE (A) NEGATIVE   RBC / HPF 0-5 0 - 5 RBC/hpf   WBC, UA 6-30 0 - 5 WBC/hpf   Bacteria, UA RARE (A) NONE SEEN   Squamous Epithelial / LPF 6-30 (A) NONE SEEN   Trans Epithel, UA 1   MRSA PCR Screening     Status: None   Collection Time: 09/27/17  6:25 AM  Result Value Ref Range   MRSA by PCR NEGATIVE NEGATIVE    Comment:        The GeneXpert MRSA Assay (FDA approved for NASAL specimens only), is one component of a comprehensive MRSA colonization surveillance program. It is not intended to diagnose MRSA infection nor to guide or monitor treatment for MRSA infections.   Glucose, capillary     Status: Abnormal    Collection Time: 09/27/17  8:11 AM  Result Value Ref Range   Glucose-Capillary 102 (H) 65 - 99 mg/dL  CK     Status: Abnormal   Collection Time: 09/27/17  9:05 AM  Result Value Ref Range   Total CK 33 (L) 38 - 234 U/L  Lactic acid, plasma     Status: None   Collection Time: 09/27/17  9:05 AM  Result Value Ref Range   Lactic Acid, Venous 1.4 0.5 - 1.9 mmol/L  TSH     Status: None   Collection Time: 09/27/17  9:05 AM  Result Value Ref Range   TSH 2.849 0.350 - 4.500 uIU/mL    Comment: Performed by a 3rd Generation assay with a functional sensitivity of <=0.01 uIU/mL.  T4, free     Status: None   Collection Time: 09/27/17  9:05 AM  Result Value Ref Range   Free T4 0.68 0.61 - 1.12 ng/dL    Comment: (NOTE) Biotin ingestion may interfere with free T4 tests. If the results are inconsistent with the TSH level, previous test results, or the clinical presentation, then consider biotin interference. If needed, order repeat testing after stopping biotin. Performed at [facility], [place] [address] [place],  [address]   Lipase, blood     Status: None   Collection Time: 09/27/17  9:05 AM  Result Value Ref Range   Lipase 23 11 - 51 U/L  Culture, blood (Routine X 2) w Reflex to ID Panel     Status: None (Preliminary result)   Collection Time: 09/27/17 12:38 PM  Result Value Ref Range   Specimen Description BLOOD RIGHT HAND    Special Requests      BOTTLES DRAWN AEROBIC AND ANAEROBIC Blood Culture adequate volume   Culture NO GROWTH < 24 HOURS    Report Status PENDING   Glucose, capillary     Status: Abnormal   Collection Time: 09/27/17 12:49 PM  Result Value Ref Range   Glucose-Capillary 101 (H) 65 - 99 mg/dL  Culture, blood (Routine X 2) w Reflex to ID Panel     Status: None (Preliminary result)   Collection Time: 09/27/17 12:50 PM  Result Value Ref Range   Specimen Description BLOOD RIGHT HAND    Special Requests      BOTTLES DRAWN AEROBIC ONLY Blood Culture results  may not be optimal due to an inadequate volume of blood received in culture bottles   Culture NO GROWTH < 24 HOURS    Report Status PENDING   Glucose, capillary     Status: Abnormal   Collection Time: 09/27/17  4:00 PM  Result Value Ref Range   Glucose-Capillary 104 (H) 65 - 99 mg/dL  Urinalysis, Complete w Microscopic     Status: Abnormal   Collection Time: 09/27/17  7:00 PM  Result Value Ref Range   Color, Urine YELLOW YELLOW   APPearance CLEAR CLEAR   Specific Gravity, Urine 1.004 (L) 1.005 - 1.030   pH 8.0 5.0 - 8.0   Glucose, UA NEGATIVE NEGATIVE mg/dL   Hgb urine dipstick NEGATIVE NEGATIVE   Bilirubin Urine NEGATIVE NEGATIVE   Ketones, ur NEGATIVE NEGATIVE mg/dL   Protein, ur NEGATIVE NEGATIVE mg/dL   Nitrite NEGATIVE NEGATIVE   Leukocytes, UA SMALL (A) NEGATIVE   RBC / HPF 0-5 0 - 5 RBC/hpf   WBC, UA 0-5 0 - 5 WBC/hpf   Bacteria, UA NONE SEEN NONE SEEN   Squamous Epithelial / LPF 0-5 (A) NONE SEEN   Trans Epithel, UA <1   Glucose, capillary     Status: Abnormal   Collection Time: 09/27/17  9:37 PM  Result Value Ref Range   Glucose-Capillary 111 (H) 65 - 99 mg/dL  Basic metabolic panel     Status: Abnormal   Collection Time: 09/28/17  5:39 AM  Result Value Ref Range   Sodium 139 135 - 145 mmol/L   Potassium 3.9 3.5 - 5.1 mmol/L   Chloride 109 101 - 111 mmol/L   CO2 20 (L) 22 - 32 mmol/L   Glucose, Bld 76 65 - 99 mg/dL   BUN 8 6 - 20 mg/dL   Creatinine, Ser 0.98 0.44 - 1.00 mg/dL   Calcium 9.0 8.9 - 10.3 mg/dL   GFR calc non Af Amer >60 >60 mL/min   GFR calc Af Amer >60 >  60 mL/min    Comment: (NOTE) The eGFR has been calculated using the CKD EPI equation. This calculation has not been validated in all clinical situations. eGFR's persistently <60 mL/min signify possible Chronic Kidney Disease.    Anion gap 10 5 - 15  CBC     Status: None   Collection Time: 09/28/17  5:39 AM  Result Value Ref Range   WBC 10.0 4.0 - 10.5 K/uL   RBC 4.16 3.87 - 5.11 MIL/uL    Hemoglobin 13.1 12.0 - 15.0 g/dL   HCT 40.6 36.0 - 46.0 %   MCV 97.6 78.0 - 100.0 fL   MCH 31.5 26.0 - 34.0 pg   MCHC 32.3 30.0 - 36.0 g/dL   RDW 13.5 11.5 - 15.5 %   Platelets 278 150 - 400 K/uL  Glucose, capillary     Status: Abnormal   Collection Time: 09/28/17  8:19 AM  Result Value Ref Range   Glucose-Capillary 149 (H) 65 - 99 mg/dL    Studies/Results:  HEAD NECK CT EXAM: CT HEAD WITHOUT CONTRAST  CT CERVICAL SPINE WITHOUT CONTRAST  TECHNIQUE: Multidetector CT imaging of the head and cervical spine was performed following the standard protocol without intravenous contrast. Multiplanar CT image reconstructions of the cervical spine were also generated.  COMPARISON:  11/02/2016  FINDINGS: CT HEAD FINDINGS  Brain: No acute territorial infarction, hemorrhage or intracranial mass is visualized. Small amount of encephalomalacia in the left cerebellum nonenlarged ventricles.  Vascular: No hyperdense vessels.  No unexpected calcification  Skull: No fracture. Left occipital craniectomy with hyperdense material and metallic plate as before.  Sinuses/Orbits: Mild mucosal thickening in the ethmoid and sphenoid sinuses. No acute orbital abnormality  Other: None  CT CERVICAL SPINE FINDINGS  Alignment: Straightening of the cervical spine. No subluxation. Facet alignment within normal limits  Skull base and vertebrae: No acute fracture. No primary bone lesion or focal pathologic process.  Soft tissues and spinal canal: No prevertebral fluid or swelling. No visible canal hematoma.  Disc levels:  Mild degenerative changes at C5-C6 and C6-C7  Upper chest: Negative.  Other: None  IMPRESSION: 1. No CT evidence for acute intracranial abnormality. Stable left occipital postsurgical changes 2. Straightening of the cervical spine.  No acute fracture seen     Brain MRI FINDINGS: Brain: Cerebral volume is stable and within normal limits.  No restricted diffusion to suggest acute infarction. No midline shift, mass effect, evidence of mass lesion, ventriculomegaly, extra-axial collection or acute intracranial hemorrhage. Cervicomedullary junction and pituitary are within normal limits.  Sequelae of left suboccipital craniectomy as seen on the CT earlier today. A small wedge-shaped area of chronic abnormal T2 and FLAIR hyperintensity in the left lateral cerebellum is unchanged.  Elsewhere gray and white matter signal appears normal for age. Thin slice coronal T2 weighted images show bilateral hippocampal formations signal and morphology to be symmetric and within normal limits. No abnormal enhancement identified. No dural thickening.  Vascular: Major intracranial vascular flow voids are stable since 2016 and within normal limits.  Skull and upper cervical spine: Negative visualized cervical spine. Stable visualized osseous structures.  Sinuses/Orbits: Normal orbits soft tissues.  Mild posterior ethmoid and sphenoid sinus mucosal thickening is new since 2016. Other paranasal sinuses remain clear.  Other: Mastoid air cells are clear. Visible internal auditory structures appear normal. Mild postoperative changes to the left posterior suboccipital scalp soft tissues.  IMPRESSION: 1.  No acute intracranial abnormality. 2. Stable and normal MRI appearance of the brain aside from  chronic left posterior fossa postoperative changes with mild left lateral cerebellar malacia.            Amariyah Bazar A. Merlene Laughter, M.D.  Diplomate, Tax adviser of Psychiatry and Neurology ( Neurology). 09/28/2017, 8:36 AM

## 2017-09-28 NOTE — Progress Notes (Signed)
Nutrition Brief Note  Patient identified on the Malnutrition Screening Tool (MST) Report. RD unable to assess pt at this time. RD to follow-up.  Wt Readings from Last 15 Encounters:  09/28/17 146 lb 2.6 oz (66.3 kg)  01/13/17 175 lb (79.4 kg)  11/08/16 182 lb (82.6 kg)  11/04/16 180 lb (81.6 kg)  11/02/16 180 lb (81.6 kg)  10/12/16 190 lb (86.2 kg)  07/30/16 192 lb (87.1 kg)  07/23/16 194 lb (88 kg)  07/21/16 194 lb (88 kg)  07/16/16 194 lb (88 kg)  07/13/16 198 lb (89.8 kg)  07/09/16 198 lb (89.8 kg)  07/02/16 206 lb (93.4 kg)  06/30/16 206 lb (93.4 kg)  06/16/16 185 lb (83.9 kg)    Body mass index is 26.73 kg/m. Patient meets criteria for normal weight for height based on current BMI. Per chart pt shows a decline in weight over the past year.  Current diet order is full liquids. No meal completion records available per chart. Labs and medications reviewed.   Fransisca KaufmannAllison Ioannides, MS, RDN, LDN 09/28/2017 1:11 PM

## 2017-09-28 NOTE — Progress Notes (Signed)
Progress Note  [facility] Date of Encounter: 09/28/2017  Consulting Cardiologist: Dr. Jonelle SidleSamuel G. Colbie Sliker  Subjective   Complains of weakness. No chest pain.  Inpatient Medications    Scheduled Meds: . cholecalciferol  1,000 Units Oral QID  . citalopram  40 mg Oral Daily  . gabapentin  400 mg Oral TID  . hydrocortisone  25 mg Rectal BID  . levETIRAcetam  1,500 mg Oral BID  . polyvinyl alcohol  1-2 drop Both Eyes Daily  . venlafaxine XR  150 mg Oral Q breakfast   Continuous Infusions: . sodium chloride 100 mL/hr at 09/27/17 2321   PRN Meds: atropine, magnesium hydroxide, ondansetron (ZOFRAN) IV   Vital Signs    Vitals:   09/28/17 0330 09/28/17 0400 09/28/17 0500 09/28/17 0600  BP: (!) 129/93 123/87 101/79 111/82  Pulse: (!) 48 (!) 48 (!) 59 62  Resp: 15 16 17 19   Temp:  98.1 F (36.7 C)    TempSrc:  Oral    SpO2: 98% 97% 95% 99%  Weight:   146 lb 2.6 oz (66.3 kg)   Height:        Intake/Output Summary (Last 24 hours) at 09/28/2017 0751 Last data filed at 09/28/2017 0600 Gross per 24 hour  Intake 2300 ml  Output 1950 ml  Net 350 ml   Filed Weights   09/27/17 0238 09/27/17 0633 09/28/17 0500  Weight: 140 lb (63.5 kg) 146 lb 2.6 oz (66.3 kg) 146 lb 2.6 oz (66.3 kg)    Telemetry    Sinus brady into 30's this am treated with 1/2 amp atropine now HR 55/m was 50-60 most of the night - Personally Reviewed.  Physical Exam    GEN: No acute distress.   Neck: No JVD Cardiac: RRR, no gallops.  Respiratory: Clear to auscultation bilaterally. GI: Soft, nontender, non-distended  MS: No edema; No deformity.  Labs    Chemistry Recent Labs  Lab 09/27/17 0233 09/27/17 0252 09/28/17 0539  NA 134* 136 139  K 4.5 4.2 3.9  CL 98* 97* 109  CO2 28  --  20*  GLUCOSE 115* 110* 76  BUN 15 15 8   CREATININE 1.10* 1.10* 0.98  CALCIUM 10.1  --  9.0  PROT 7.9  --   --   ALBUMIN 4.0  --   --   AST 26  --   --   ALT 16  --   --   ALKPHOS 34*   --   --   BILITOT 0.7  --   --   GFRNONAA 59*  --  >60  GFRAA >60  --  >60  ANIONGAP 8  --  10     Hematology Recent Labs  Lab 09/27/17 0233 09/27/17 0252 09/28/17 0539  WBC 9.5  --  10.0  RBC 4.53  --  4.16  HGB 14.4 15.0 13.1  HCT 43.1 44.0 40.6  MCV 95.1  --  97.6  MCH 31.8  --  31.5  MCHC 33.4  --  32.3  RDW 13.0  --  13.5  PLT 360  --  278    Cardiac EnzymesNo results for input(s): TROPONINI in the last 168 hours.  Recent Labs  Lab 09/27/17 0302  TROPIPOC 0.00     Radiology    Ct Head Wo Contrast  Result Date: 09/27/2017 CLINICAL DATA:  Seizure and fall EXAM: CT HEAD WITHOUT CONTRAST CT CERVICAL SPINE WITHOUT CONTRAST TECHNIQUE: Multidetector CT imaging of the head and cervical  spine was performed following the standard protocol without intravenous contrast. Multiplanar CT image reconstructions of the cervical spine were also generated. COMPARISON:  11/02/2016 FINDINGS: CT HEAD FINDINGS Brain: No acute territorial infarction, hemorrhage or intracranial mass is visualized. Small amount of encephalomalacia in the left cerebellum nonenlarged ventricles. Vascular: No hyperdense vessels.  No unexpected calcification Skull: No fracture. Left occipital craniectomy with hyperdense material and metallic plate as before. Sinuses/Orbits: Mild mucosal thickening in the ethmoid and sphenoid sinuses. No acute orbital abnormality Other: None CT CERVICAL SPINE FINDINGS Alignment: Straightening of the cervical spine. No subluxation. Facet alignment within normal limits Skull base and vertebrae: No acute fracture. No primary bone lesion or focal pathologic process. Soft tissues and spinal canal: No prevertebral fluid or swelling. No visible canal hematoma. Disc levels:  Mild degenerative changes at C5-C6 and C6-C7 Upper chest: Negative. Other: None IMPRESSION: 1. No CT evidence for acute intracranial abnormality. Stable left occipital postsurgical changes 2. Straightening of the cervical  spine.  No acute fracture seen Electronically Signed   By: Jasmine PangKim  Fujinaga M.D.   On: 09/27/2017 03:51   Ct Cervical Spine Wo Contrast  Result Date: 09/27/2017 CLINICAL DATA:  Seizure and fall EXAM: CT HEAD WITHOUT CONTRAST CT CERVICAL SPINE WITHOUT CONTRAST TECHNIQUE: Multidetector CT imaging of the head and cervical spine was performed following the standard protocol without intravenous contrast. Multiplanar CT image reconstructions of the cervical spine were also generated. COMPARISON:  11/02/2016 FINDINGS: CT HEAD FINDINGS Brain: No acute territorial infarction, hemorrhage or intracranial mass is visualized. Small amount of encephalomalacia in the left cerebellum nonenlarged ventricles. Vascular: No hyperdense vessels.  No unexpected calcification Skull: No fracture. Left occipital craniectomy with hyperdense material and metallic plate as before. Sinuses/Orbits: Mild mucosal thickening in the ethmoid and sphenoid sinuses. No acute orbital abnormality Other: None CT CERVICAL SPINE FINDINGS Alignment: Straightening of the cervical spine. No subluxation. Facet alignment within normal limits Skull base and vertebrae: No acute fracture. No primary bone lesion or focal pathologic process. Soft tissues and spinal canal: No prevertebral fluid or swelling. No visible canal hematoma. Disc levels:  Mild degenerative changes at C5-C6 and C6-C7 Upper chest: Negative. Other: None IMPRESSION: 1. No CT evidence for acute intracranial abnormality. Stable left occipital postsurgical changes 2. Straightening of the cervical spine.  No acute fracture seen Electronically Signed   By: Jasmine PangKim  Fujinaga M.D.   On: 09/27/2017 03:51   Mr Laqueta JeanBrain W ZHWo Contrast  Result Date: 09/27/2017 CLINICAL DATA:  46 year old female status post seizure and fall. Headache. Personal history of trigeminal neuralgia decompression in 2009. EXAM: MRI HEAD WITHOUT AND WITH CONTRAST TECHNIQUE: Multiplanar, multiecho pulse sequences of the brain and  surrounding structures were obtained without and with intravenous contrast. CONTRAST:  10mL MULTIHANCE GADOBENATE DIMEGLUMINE 529 MG/ML IV SOLN COMPARISON:  Head and cervical spine CT 0316 hours today. [facility]. FINDINGS: Brain: Cerebral volume is stable and within normal limits. No restricted diffusion to suggest acute infarction. No midline shift, mass effect, evidence of mass lesion, ventriculomegaly, extra-axial collection or acute intracranial hemorrhage. Cervicomedullary junction and pituitary are within normal limits. Sequelae of left suboccipital craniectomy as seen on the CT earlier today. A small wedge-shaped area of chronic abnormal T2 and FLAIR hyperintensity in the left lateral cerebellum is unchanged. Elsewhere gray and white matter signal appears normal for age. Thin slice coronal T2 weighted images show bilateral hippocampal formations signal and morphology to be symmetric and within normal limits. No abnormal enhancement identified. No dural  thickening. Vascular: Major intracranial vascular flow voids are stable since 2016 and within normal limits. Skull and upper cervical spine: Negative visualized cervical spine. Stable visualized osseous structures. Sinuses/Orbits: Normal orbits soft tissues. Mild posterior ethmoid and sphenoid sinus mucosal thickening is new since 2016. Other paranasal sinuses remain clear. Other: Mastoid air cells are clear. Visible internal auditory structures appear normal. Mild postoperative changes to the left posterior suboccipital scalp soft tissues. IMPRESSION: 1.  No acute intracranial abnormality. 2. Stable and normal MRI appearance of the brain aside from chronic left posterior fossa postoperative changes with mild left lateral cerebellar malacia. Electronically Signed   By: Odessa Fleming M.D.   On: 09/27/2017 12:45    Cardiac Studies   Echocardiogram 09/28/2017: Study Conclusions  - Left ventricle: The cavity size was normal.  Wall thickness was   normal. Systolic function was normal. The estimated ejection   fraction was in the range of 55% to 60%. Wall motion was normal;   there were no regional wall motion abnormalities. - Mitral valve: There was trivial regurgitation. - Right atrium: Central venous pressure (est): 3 mm Hg. - Atrial septum: No defect or patent foramen ovale was identified. - Tricuspid valve: There was mild regurgitation. - Pulmonary arteries: PA peak pressure: 25 mm Hg (S). - Pericardium, extracardiac: There was no pericardial effusion.  Impressions:  - Normal LV wall thickness with LVEF 55-60%, indeterminate   diastolic function. Trivial mitral regurgitation. Mild tricuspid   regurgitation with PASP estimated 25 mmHg.  Patient Profile     46 y.o. female  with a hx of anxiety, hyperlipidemia, hypertension, type 2 diabetes, seizures, CVA, PTSD and panic attacks, previous stroke with memory loss, who is being seen for the evaluation of bradycardia.   Assessment & Plan    1. Sinus bradycardia: Heart rate as low as the upper 30s with sinus bradycardia noted and some  junctional rhythm. Receive 1/2 amp atropine this am for drop in the 30's- was in the 50-60's most of the night. No pauses observed or heart block.  She is not on any AV nodal blockers.  Still question whether this could be a reflection of increased vagal tone or even increased intracranial pressures.  2D echo with normal LV function trivial MR and mild TR. TSH normal. Await neuro consult. Continue to monitor.   2. Hx of Hypertension:  She is on lisinopril/HCTZ at home  This has been held for because of relative hypotension and falls. Follow BP trend and consider resumption perhaps at lower dose depending on BP.   3. Seizures with falls: Followed by Dr. Gerilyn Pilgrim, neurology.    4. Hx of Diabetes: On PO Actos for glucose control    5. Abdominal Pain: Complaints of hematochezia and lower left abdominal pain with unintended 100  pound  weight loss.    For questions or updates, please contact [facility] Please consult [email or web address] for contact info under Cardiology/STEMI.      Signed, Jacolyn Reedy, PA-C  09/28/2017, 7:51 AM     Attending note:  Patient seen and examined. I reviewed interval hospital course and discussed case with Ms. Geni Bers PA-C. Heart rate largely in 50-60 range, although had drop into upper 30s this a.m. and received atropine. She did not have syncope with this and otherwise feels weak at baseline.   Echocardiogram from 09/27/2017 revealed LVEF 55-60% without regional wall motion abnormalities, no major valvular abnormalities.  On examination this morning blood pressure has been elevated with systolics in the 140s-160s, heart  rate currently 55 in sinus rhythm by telemetry which I personally reviewed.  She has not had any obvious pauses or heart block.  Lungs clear and nonlabored.  Cardiac exam with RRR no gallop.  Lab work shows potassium 3.9, BUN 8, creatinine 0.98, hemoglobin 13.1.  Patient has continued to have episodes of sinus bradycardia, although no pauses or heart block to suggest clear indication for pacemaker at this time.  Etiology of her bradycardia is still not entirely clear, but question possibility of increased intracranial pressure with her neurological history or increased vagal tone.  Sleep apnea would also be a potential contributor, although she does not have a formal diagnosis.  TSH normal. Would continue to avoid any AV nodal blockers.  Jonelle Sidle, M.D., F.A.C.C.

## 2017-09-28 NOTE — Plan of Care (Signed)
[facility]. No dizziness or SOB. Had 3 Bm's. Abdomen no longer firm. HR maintained 40's-50's. When up and moving HR increases to 70's-80's.

## 2017-09-28 NOTE — Progress Notes (Signed)
PROGRESS NOTE    Mariah SchlatterMelinda F [facility]  AVW:[id number] DOB: 08/22/1971 DOA: 09/27/2017 [name], [name], MD    Brief Narrative:  46 year old female with a history of seizure disorder, hypertension, hyperlipidemia, anxiety/panic attacks presenting with syncope and nausea and vomiting.  The patient had numerous episodes of nausea and vomiting that began on September 21, 2017 without any hematemesis.  She also complained of lower abdominal pain without dysuria or hematuria.  She denied any fevers, chills, chest pain, shortness breath, cough, hemoptysis.  Her nausea and vomiting improved, and she has not had any emesis for the past 3 days prior to admission.  However, for some reason the patient quit taking her Keppra over the weekend, and she has not taken it for 3 days prior to admission.  The patient began feeling dizzy.  To treat her nausea and dizziness, the patient began taking over-the-counter Dramamine 4-5 times per day  starting on Thanksgiving day.  Since then, the patient had multiple falls and syncopal episodes.  According to her significant other, many of her syncopal episodes occur without any prodromal type symptoms as she is getting up to go to the bathroom.  However during a couple of episodes, he noted that the patient had some "shaking"which would last only a few seconds.  He states that during some of these episodes the patient would be awake, and speak nonsensically.  In addition, she has had some episodes where she had some prodromal symptom of blurry vision from her right eye.  Over the past week, she has had very poor oral intake, but denies any diarrhea.  In fact she has felt constipated.  As a result, the patient had some hematochezia over the weekend.  She denies any melena or hematemesis or hematuria.  In the emergency department, the patient was afebrile hemodynamically stable.  She was noted to be bradycardic with heart rate 37.  She was given 2 doses of atropine.  BMP, CBC, and  LFTs were essentially unremarkable.  EKG shows sinus rhythm with early repolarization.     Assessment & Plan:   Principal Problem:   Symptomatic sinus bradycardia Active Problems:   Trigeminal neuralgia   Hypertension   Bradycardia   Hyperlipidemia   Hyponatremia   Seizure (HCC)   Anxiety   Volume depletion   Syncope and collapse   UTI (urinary tract infection)   Left lower quadrant pain   Seizure-like activity (HCC)   Syncope -Likely multifactorial including volume depletion with possible seizure and symptomatic bradycardia -Echocardiogram showing ejection fraction of 55-60% without regional wall motion abnormality no valvular abnormalities -Monitor on telemetry -Urine drug screen positive for benzodiazepines -EEG normal  Bradycardia -Cardiology consult -TSH normal -Echocardiogram results as above -Patient needed atropine this morning given her heart rate went into the 30s again  Seizure disorder/Seizure like activity -hx also sound like possible PNES  -Patient quit taking her Keppra for 3 days prior to admission even though her vomiting improved -[facility] that she has self adjusted or stopped her medications in the past -Continue Keppra and gabapentin -Neurology consultation pending -Discussed MRI findings with neurology who states all findings are chronic  Hypotension -Blood cultures x2 showing no growth for less than 24 hours -Lactic acid of 1.4 -Continue IV fluids -UA and urine culture  Positive i-STAT hCG -Serum HCG = 6 (marginally positive) -pt with hysterectomy -Previous provider discussed with OB/GYN, Dr. Loma BostonEure-->likely false positive due to pt's  perimenopausal state with spike in LH-->would not repeat HCG  Essential hypertension -Holding lisinopril and HCTZ secondary to hypotension - Continue to monitor blood pressures  Hematochezia/abdominal pain -Likely due to  constipation -Hemoglobin stable -Try Anusol suppositories stock orthotics -GI consulted -No further episodes of bright red blood per rectum -CT abdomen/pelvis -H&H has remained stable  Hyponatremia -Resolved  Depression/Anxiety -Continue Celexa,Effexor  Somnolence -Patient is awake and alert this morning     DVT prophylaxis: SCDs Code Status: Full code Family Communication: Husband is bedside Disposition Plan: Pending further evaluation by neurology   Consultants:   Neurology  Cardiology  Gastroenterology  Procedures:   EEG on 09/27/2017: Results pending Echocardiogram on 09/27/2017:Normal LV wall thickness with LVEF 55-60%, indeterminate   diastolic function. Trivial mitral regurgitation. Mild tricuspid    regurgitation with PASP estimated 25 mmHg.  Antimicrobials:   None   Subjective: Patient seen and examined this morning.  She had an episode of bradycardia for which she was given half an amp of atropine.  Patient reports that she is just tired because she did not sleep much last night.  She denies any further episodes of bright red blood per rectum.  She does mention she had numerous episodes of diarrhea overnight which is why she did not sleep  Objective: Vitals:   09/28/17 0330 09/28/17 0400 09/28/17 0500 09/28/17 0600  BP: (!) 129/93 123/87 101/79 111/82  Pulse: (!) 48 (!) 48 (!) 59 62  Resp: 15 16 17 19   Temp:  98.1 F (36.7 C)    TempSrc:  Oral    SpO2: 98% 97% 95% 99%  Weight:   66.3 kg (146 lb 2.6 oz)   Height:        Intake/Output Summary (Last 24 hours) at 09/28/2017 0745 Last data filed at 09/28/2017 0600 Gross per 24 hour  Intake 2300 ml  Output 1950 ml  Net 350 ml   Filed Weights   09/27/17 0238 09/27/17 0633 09/28/17 0500  Weight: 63.5 kg (140 lb) 66.3 kg (146 lb 2.6 oz) 66.3 kg (146 lb 2.6 oz)    Examination:  General exam: Appears calm and comfortable  Respiratory system: Clear to auscultation. Respiratory effort  normal. Cardiovascular system: S1 & S2 heard, RRR. No JVD, murmurs, rubs, gallops or clicks. No pedal edema. Gastrointestinal system: Abdomen is nondistended, soft and nontender. No organomegaly or masses felt. Normal bowel sounds heard. Central nervous system: Alert and oriented. No focal neurological deficits. Extremities: Symmetric 5 x 5 power. Skin: No rashes, lesions or ulcers Psychiatry: Judgement and insight appear normal. Mood & affect appropriate.     Data Reviewed: I have personally reviewed following labs and imaging studies  CBC: Recent Labs  Lab 09/27/17 0233 09/27/17 0252 09/28/17 0539  WBC 9.5  --  10.0  NEUTROABS 4.3  --   --   HGB 14.4 15.0 13.1  HCT 43.1 44.0 40.6  MCV 95.1  --  97.6  PLT 360  --  278   Basic Metabolic Panel: Recent Labs  Lab 09/27/17 0233 09/27/17 0252 09/28/17 0539  NA 134* 136 139  K 4.5 4.2 3.9  CL 98* 97* 109  CO2 28  --  20*  GLUCOSE 115* 110* 76  BUN 15 15 8   CREATININE 1.10* 1.10* 0.98  CALCIUM 10.1  --  9.0  MG 2.1  --   --    GFR: Estimated Creatinine Clearance: 64.1 mL/min (by C-G formula based on SCr of 0.98 mg/dL). Liver Function Tests: Recent Labs  Lab 09/27/17 0233  AST 26  ALT 16  ALKPHOS 34*  BILITOT 0.7  PROT 7.9  ALBUMIN 4.0   Recent Labs  Lab 09/27/17 0905  LIPASE 23   No results for input(s): AMMONIA in the last 168 hours. Coagulation Profile: Recent Labs  Lab 09/27/17 0233  INR 0.93   Cardiac Enzymes: Recent Labs  Lab 09/27/17 0905  CKTOTAL 33*   BNP (last 3 results) No results for input(s): PROBNP in the last 8760 hours. HbA1C: No results for input(s): HGBA1C in the last 72 hours. CBG: Recent Labs  Lab 09/27/17 0811 09/27/17 1249 09/27/17 1600 09/27/17 2137  GLUCAP 102* 101* 104* 111*   Lipid Profile: No results for input(s): CHOL, HDL, LDLCALC, TRIG, CHOLHDL, LDLDIRECT in the last 72 hours. Thyroid Function Tests: Recent Labs    09/27/17 0905  TSH 2.849  FREET4 0.68    Anemia Panel: No results for input(s): VITAMINB12, FOLATE, FERRITIN, TIBC, IRON, RETICCTPCT in the last 72 hours. Sepsis Labs: Recent Labs  Lab 09/27/17 1610  LATICACIDVEN 1.4    Recent Results (from the past 240 hour(s))  MRSA PCR Screening     Status: None   Collection Time: 09/27/17  6:25 AM  Result Value Ref Range Status   MRSA by PCR NEGATIVE NEGATIVE Final    Comment:        The GeneXpert MRSA Assay (FDA approved for NASAL specimens only), is one component of a comprehensive MRSA colonization surveillance program. It is not intended to diagnose MRSA infection nor to guide or monitor treatment for MRSA infections.   Culture, blood (Routine X 2) w Reflex to ID Panel     Status: None (Preliminary result)   Collection Time: 09/27/17 12:38 PM  Result Value Ref Range Status   Specimen Description BLOOD RIGHT HAND  Final   Special Requests   Final    BOTTLES DRAWN AEROBIC AND ANAEROBIC Blood Culture adequate volume   Culture NO GROWTH < 24 HOURS  Final   Report Status PENDING  Incomplete  Culture, blood (Routine X 2) w Reflex to ID Panel     Status: None (Preliminary result)   Collection Time: 09/27/17 12:50 PM  Result Value Ref Range Status   Specimen Description BLOOD RIGHT HAND  Final   Special Requests   Final    BOTTLES DRAWN AEROBIC ONLY Blood Culture results may not be optimal due to an inadequate volume of blood received in culture bottles   Culture NO GROWTH < 24 HOURS  Final   Report Status PENDING  Incomplete         Radiology Studies: Ct Head Wo Contrast  Result Date: 09/27/2017 CLINICAL DATA:  Seizure and fall EXAM: CT HEAD WITHOUT CONTRAST CT CERVICAL SPINE WITHOUT CONTRAST TECHNIQUE: Multidetector CT imaging of the head and cervical spine was performed following the standard protocol without intravenous contrast. Multiplanar CT image reconstructions of the cervical spine were also generated. COMPARISON:  11/02/2016 FINDINGS: CT HEAD FINDINGS Brain:  No acute territorial infarction, hemorrhage or intracranial mass is visualized. Small amount of encephalomalacia in the left cerebellum nonenlarged ventricles. Vascular: No hyperdense vessels.  No unexpected calcification Skull: No fracture. Left occipital craniectomy with hyperdense material and metallic plate as before. Sinuses/Orbits: Mild mucosal thickening in the ethmoid and sphenoid sinuses. No acute orbital abnormality Other: None CT CERVICAL SPINE FINDINGS Alignment: Straightening of the cervical spine. No subluxation. Facet alignment within normal limits Skull base and vertebrae: No acute fracture. No primary bone lesion or focal pathologic  process. Soft tissues and spinal canal: No prevertebral fluid or swelling. No visible canal hematoma. Disc levels:  Mild degenerative changes at C5-C6 and C6-C7 Upper chest: Negative. Other: None IMPRESSION: 1. No CT evidence for acute intracranial abnormality. Stable left occipital postsurgical changes 2. Straightening of the cervical spine.  No acute fracture seen Electronically Signed   By: Jasmine Pang M.D.   On: 09/27/2017 03:51   Ct Cervical Spine Wo Contrast  Result Date: 09/27/2017 CLINICAL DATA:  Seizure and fall EXAM: CT HEAD WITHOUT CONTRAST CT CERVICAL SPINE WITHOUT CONTRAST TECHNIQUE: Multidetector CT imaging of the head and cervical spine was performed following the standard protocol without intravenous contrast. Multiplanar CT image reconstructions of the cervical spine were also generated. COMPARISON:  11/02/2016 FINDINGS: CT HEAD FINDINGS Brain: No acute territorial infarction, hemorrhage or intracranial mass is visualized. Small amount of encephalomalacia in the left cerebellum nonenlarged ventricles. Vascular: No hyperdense vessels.  No unexpected calcification Skull: No fracture. Left occipital craniectomy with hyperdense material and metallic plate as before. Sinuses/Orbits: Mild mucosal thickening in the ethmoid and sphenoid sinuses. No acute  orbital abnormality Other: None CT CERVICAL SPINE FINDINGS Alignment: Straightening of the cervical spine. No subluxation. Facet alignment within normal limits Skull base and vertebrae: No acute fracture. No primary bone lesion or focal pathologic process. Soft tissues and spinal canal: No prevertebral fluid or swelling. No visible canal hematoma. Disc levels:  Mild degenerative changes at C5-C6 and C6-C7 Upper chest: Negative. Other: None IMPRESSION: 1. No CT evidence for acute intracranial abnormality. Stable left occipital postsurgical changes 2. Straightening of the cervical spine.  No acute fracture seen Electronically Signed   By: Jasmine Pang M.D.   On: 09/27/2017 03:51   Mr Laqueta Jean RU Contrast  Result Date: 09/27/2017 CLINICAL DATA:  46 year old female status post seizure and fall. Headache. Personal history of trigeminal neuralgia decompression in 2009. EXAM: MRI HEAD WITHOUT AND WITH CONTRAST TECHNIQUE: Multiplanar, multiecho pulse sequences of the brain and surrounding structures were obtained without and with intravenous contrast. CONTRAST:  10mL MULTIHANCE GADOBENATE DIMEGLUMINE 529 MG/ML IV SOLN COMPARISON:  Head and cervical spine CT 0316 hours today. [facility] Brain MRI 03/27/2015. FINDINGS: Brain: Cerebral volume is stable and within normal limits. No restricted diffusion to suggest acute infarction. No midline shift, mass effect, evidence of mass lesion, ventriculomegaly, extra-axial collection or acute intracranial hemorrhage. Cervicomedullary junction and pituitary are within normal limits. Sequelae of left suboccipital craniectomy as seen on the CT earlier today. A small wedge-shaped area of chronic abnormal T2 and FLAIR hyperintensity in the left lateral cerebellum is unchanged. Elsewhere gray and white matter signal appears normal for age. Thin slice coronal T2 weighted images show bilateral hippocampal formations signal and morphology to be symmetric and within normal  limits. No abnormal enhancement identified. No dural thickening. Vascular: Major intracranial vascular flow voids are stable since 2016 and within normal limits. Skull and upper cervical spine: Negative visualized cervical spine. Stable visualized osseous structures. Sinuses/Orbits: Normal orbits soft tissues. Mild posterior ethmoid and sphenoid sinus mucosal thickening is new since 2016. Other paranasal sinuses remain clear. Other: Mastoid air cells are clear. Visible internal auditory structures appear normal. Mild postoperative changes to the left posterior suboccipital scalp soft tissues. IMPRESSION: 1.  No acute intracranial abnormality. 2. Stable and normal MRI appearance of the brain aside from chronic left posterior fossa postoperative changes with mild left lateral cerebellar malacia. Electronically Signed   By: Odessa Fleming M.D.   On: 09/27/2017 12:45  Scheduled Meds: . cholecalciferol  1,000 Units Oral QID  . citalopram  40 mg Oral Daily  . gabapentin  400 mg Oral TID  . hydrocortisone  25 mg Rectal BID  . levETIRAcetam  1,500 mg Oral BID  . polyvinyl alcohol  1-2 drop Both Eyes Daily  . venlafaxine XR  150 mg Oral Q breakfast   Continuous Infusions: . sodium chloride 100 mL/hr at 09/27/17 2321     LOS: 0 days    Time spent: 40 minutes    Katrinka Blazing, MD [facility] Pager [phone]  If 7PM-7AM, please contact night-coverage [email or web address] Password [facility] 09/28/2017, 7:45 AM

## 2017-09-29 DIAGNOSIS — R55 Syncope and collapse: Secondary | ICD-10-CM | POA: Diagnosis not present

## 2017-09-29 DIAGNOSIS — R001 Bradycardia, unspecified: Secondary | ICD-10-CM | POA: Diagnosis present

## 2017-09-29 DIAGNOSIS — E119 Type 2 diabetes mellitus without complications: Secondary | ICD-10-CM | POA: Diagnosis present

## 2017-09-29 DIAGNOSIS — G500 Trigeminal neuralgia: Secondary | ICD-10-CM | POA: Diagnosis present

## 2017-09-29 DIAGNOSIS — Z79899 Other long term (current) drug therapy: Secondary | ICD-10-CM | POA: Diagnosis not present

## 2017-09-29 DIAGNOSIS — R296 Repeated falls: Secondary | ICD-10-CM | POA: Diagnosis not present

## 2017-09-29 DIAGNOSIS — I959 Hypotension, unspecified: Secondary | ICD-10-CM | POA: Diagnosis present

## 2017-09-29 DIAGNOSIS — G40909 Epilepsy, unspecified, not intractable, without status epilepticus: Secondary | ICD-10-CM | POA: Diagnosis not present

## 2017-09-29 DIAGNOSIS — E782 Mixed hyperlipidemia: Secondary | ICD-10-CM | POA: Diagnosis not present

## 2017-09-29 DIAGNOSIS — I10 Essential (primary) hypertension: Secondary | ICD-10-CM | POA: Diagnosis not present

## 2017-09-29 DIAGNOSIS — E869 Volume depletion, unspecified: Secondary | ICD-10-CM | POA: Diagnosis present

## 2017-09-29 DIAGNOSIS — F410 Panic disorder [episodic paroxysmal anxiety] without agoraphobia: Secondary | ICD-10-CM | POA: Diagnosis present

## 2017-09-29 DIAGNOSIS — F419 Anxiety disorder, unspecified: Secondary | ICD-10-CM | POA: Diagnosis not present

## 2017-09-29 DIAGNOSIS — K921 Melena: Secondary | ICD-10-CM | POA: Diagnosis present

## 2017-09-29 DIAGNOSIS — N390 Urinary tract infection, site not specified: Secondary | ICD-10-CM | POA: Diagnosis present

## 2017-09-29 DIAGNOSIS — S161XXA Strain of muscle, fascia and tendon at neck level, initial encounter: Secondary | ICD-10-CM | POA: Diagnosis not present

## 2017-09-29 DIAGNOSIS — I69398 Other sequelae of cerebral infarction: Secondary | ICD-10-CM | POA: Diagnosis not present

## 2017-09-29 DIAGNOSIS — G92 Toxic encephalopathy: Secondary | ICD-10-CM | POA: Diagnosis present

## 2017-09-29 DIAGNOSIS — R569 Unspecified convulsions: Secondary | ICD-10-CM | POA: Diagnosis not present

## 2017-09-29 DIAGNOSIS — S0993XA Unspecified injury of face, initial encounter: Secondary | ICD-10-CM | POA: Diagnosis not present

## 2017-09-29 DIAGNOSIS — Z90710 Acquired absence of both cervix and uterus: Secondary | ICD-10-CM | POA: Diagnosis not present

## 2017-09-29 DIAGNOSIS — F4310 Post-traumatic stress disorder, unspecified: Secondary | ICD-10-CM | POA: Diagnosis present

## 2017-09-29 DIAGNOSIS — R413 Other amnesia: Secondary | ICD-10-CM | POA: Diagnosis present

## 2017-09-29 DIAGNOSIS — R1032 Left lower quadrant pain: Secondary | ICD-10-CM | POA: Diagnosis not present

## 2017-09-29 DIAGNOSIS — Z8249 Family history of ischemic heart disease and other diseases of the circulatory system: Secondary | ICD-10-CM | POA: Diagnosis not present

## 2017-09-29 DIAGNOSIS — K5900 Constipation, unspecified: Secondary | ICD-10-CM | POA: Diagnosis present

## 2017-09-29 DIAGNOSIS — E871 Hypo-osmolality and hyponatremia: Secondary | ICD-10-CM | POA: Diagnosis not present

## 2017-09-29 DIAGNOSIS — S0990XA Unspecified injury of head, initial encounter: Secondary | ICD-10-CM | POA: Diagnosis not present

## 2017-09-29 DIAGNOSIS — E785 Hyperlipidemia, unspecified: Secondary | ICD-10-CM | POA: Diagnosis present

## 2017-09-29 DIAGNOSIS — Z888 Allergy status to other drugs, medicaments and biological substances status: Secondary | ICD-10-CM | POA: Diagnosis not present

## 2017-09-29 DIAGNOSIS — R29818 Other symptoms and signs involving the nervous system: Secondary | ICD-10-CM | POA: Diagnosis not present

## 2017-09-29 DIAGNOSIS — W19XXXA Unspecified fall, initial encounter: Secondary | ICD-10-CM | POA: Diagnosis present

## 2017-09-29 LAB — HIV ANTIBODY (ROUTINE TESTING W REFLEX): HIV Screen 4th Generation wRfx: NONREACTIVE

## 2017-09-29 LAB — URINE CULTURE: Culture: NO GROWTH

## 2017-09-29 LAB — GLUCOSE, CAPILLARY
GLUCOSE-CAPILLARY: 114 mg/dL — AB (ref 65–99)
GLUCOSE-CAPILLARY: 94 mg/dL (ref 65–99)
GLUCOSE-CAPILLARY: 111 mg/dL — AB (ref 65–99)
Glucose-Capillary: 84 mg/dL (ref 65–99)

## 2017-09-29 LAB — CORTISOL-AM, BLOOD: CORTISOL - AM: 10.3 ug/dL (ref 6.7–22.6)

## 2017-09-29 MED ORDER — LISINOPRIL 20 MG PO TABS
20.0000 mg | ORAL_TABLET | Freq: Every day | ORAL | Status: DC
  Administered 2017-09-29 – 2017-09-30 (×2): 20 mg via ORAL
  Filled 2017-09-29 (×2): qty 2
  Filled 2017-09-29: qty 1

## 2017-09-29 MED ORDER — ENSURE ENLIVE PO LIQD
237.0000 mL | Freq: Two times a day (BID) | ORAL | Status: DC
  Administered 2017-09-29 – 2017-09-30 (×2): 237 mL via ORAL

## 2017-09-29 MED ORDER — HYDROCHLOROTHIAZIDE 25 MG PO TABS
25.0000 mg | ORAL_TABLET | Freq: Every day | ORAL | Status: DC
  Administered 2017-09-29 – 2017-09-30 (×2): 25 mg via ORAL
  Filled 2017-09-29 (×2): qty 1

## 2017-09-29 MED ORDER — ACETAMINOPHEN 325 MG PO TABS
650.0000 mg | ORAL_TABLET | Freq: Four times a day (QID) | ORAL | Status: DC | PRN
  Administered 2017-09-29 – 2017-10-02 (×3): 650 mg via ORAL
  Filled 2017-09-29 (×3): qty 2

## 2017-09-29 NOTE — Progress Notes (Signed)
PROGRESS NOTE    Mariah Ibarra [facility]  RUE:[id number] DOB: 04-29-1971 DOA: 09/27/2017 PCP: Frederich Chick., MD    Brief Narrative:  46 year old female with a history of seizure disorder, hypertension, hyperlipidemia, anxiety/panic attacks presenting with syncope and nausea and vomiting.  The patient had numerous episodes of nausea and vomiting that began on September 21, 2017 without any hematemesis.  She also complained of lower abdominal pain without dysuria or hematuria.  She denied any fevers, chills, chest pain, shortness breath, cough, hemoptysis.  Her nausea and vomiting improved, and she has not had any emesis for the past 3 days prior to admission.  However, for some reason the patient quit taking her Keppra over the weekend, and she has not taken it for 3 days prior to admission.  The patient began feeling dizzy.  To treat her nausea and dizziness, the patient began taking over-the-counter Dramamine 4-5 times per day  starting on Thanksgiving day.  Since then, the patient had multiple falls and syncopal episodes.  According to her significant other, many of her syncopal episodes occur without any prodromal type symptoms as she is getting up to go to the bathroom.  However during a couple of episodes, he noted that the patient had some "shaking"which would last only a few seconds.  He states that during some of these episodes the patient would be awake, and speak nonsensically.  In addition, she has had some episodes where she had some prodromal symptom of blurry vision from her right eye.  Over the past week, she has had very poor oral intake, but denies any diarrhea.  In fact she has felt constipated.  As a result, the patient had some hematochezia over the weekend.  She denies any melena or hematemesis or hematuria.  In the emergency department, the patient was afebrile hemodynamically stable.  She was noted to be bradycardic with heart rate 37.  She was given 2 doses of atropine.  BMP, CBC, and  LFTs were essentially unremarkable.  EKG shows sinus rhythm with early repolarization.   Patient was seen by cardiology for her repeated episodes of bradycardia.  She required intermittent doses of atropine to increase her pulse rate from the low 30s to at least 50 bpm.  She was also seen by neurology and underwent EEG which did not show any epileptiform activity.  GI was also consulted for further evaluation of her bright red blood per rectum which she voices she had intermittently during her hospitalization.    Assessment & Plan:   Principal Problem:   Symptomatic sinus bradycardia Active Problems:   Trigeminal neuralgia   Hypertension   Bradycardia   Hyperlipidemia   Hyponatremia   Seizure (HCC)   Anxiety   Volume depletion   Syncope and collapse   UTI (urinary tract infection)   Left lower quadrant pain   Seizure-like activity (HCC)   Syncope -Likely multifactorial including volume depletion with possible seizure and symptomatic bradycardia -Echocardiogram showing ejection fraction of 55-60% without regional wall motion abnormality no valvular abnormalities -Monitor on telemetry -Urine drug screen positive for benzodiazepines -EEG normal -PT consult  Bradycardia -Cardiology consult -TSH normal -Echocardiogram results as above -Patient has intermittently required atropine to increase her heart rate as it has periodically dipped in the 40s and 30s -Discussed with Dr.McDowell-as patient does not have any signs of arrhythmia, or pauses it is unclear as to whether or not she would really benefit from a pacemaker  Seizure disorder/Seizure like activity -hx also sound like  possible PNES  -Patient quit taking her Keppra for 3 days prior to admission even though her vomiting improved -[facility] that she has self adjusted or stopped her medications in the past -Continue Keppra and gabapentin -Neurology  consultation pending -Discussed MRI findings with neurology who states all findings are chronic  Hypotension -Blood cultures x2 showing no growth for 2 days -Lactic acid of 1.4 -Discontinue IV fluids -urine culture showing no growth  Essential hypertension - Restart home medication of Prinzide today  Hematochezia/abdominal pain -GI consulted -patient reports small blood in bowel movements last evening -CT abdomen/pelvis - CBC pending  Hyponatremia -Resolved  Depression/Anxiety - Continue Celexa,Effexor     DVT prophylaxis: SCDs Code Status: Full code Family Communication: Husband is bedside Disposition Plan: Pending further evaluation by neurology   Consultants:   Neurology  Cardiology  Gastroenterology  Procedures:   EEG on 09/27/2017: Results pending Echocardiogram on 09/27/2017:Normal LV wall thickness with LVEF 55-60%, indeterminate diastolic function. Trivial mitral regurgitation. Mild tricuspid regurgitation with PASP estimated 25 mmHg.  Antimicrobials:   None   Subjective: Patient seen this morning.  She is surrounded by family.  She voices that she is very concerned that she continues to have bradycardia episodes.  She is frustrated that both neurology and cardiology have failed to give her definitive answers as to why she has continued episodes of bradycardia requiring atropine.  She is concerned that she will be discharged home and continue to have these episodes as well as syncopal episodes and seriously hurt herself.  Objective: Vitals:   09/29/17 0400 09/29/17 0500 09/29/17 0600 09/29/17 0700  BP: (!) 153/107 (!) 148/99 (!) 188/128 (!) 183/130  Pulse: (!) 48 (!) 38 (!) 58 (!) 54  Resp: 13 19 (!) 22 18  Temp: 97.9 F (36.6 C)     TempSrc: Oral     SpO2: 96% 98% 93% 93%  Weight:  71.6 kg (157 lb 13.6 oz)    Height:        Intake/Output Summary (Last 24 hours) at 09/29/2017 0754 Last data filed at 09/29/2017 0600 Gross per 24 hour    Intake 1440 ml  Output -  Net 1440 ml   Filed Weights   09/27/17 0633 09/28/17 0500 09/29/17 0500  Weight: 66.3 kg (146 lb 2.6 oz) 66.3 kg (146 lb 2.6 oz) 71.6 kg (157 lb 13.6 oz)    Examination:  General exam: Appears calm and comfortable  Respiratory system: Clear to auscultation. Respiratory effort normal. Cardiovascular system: S1 & S2 heard, bradycardic. No JVD, murmurs, rubs, gallops or clicks. No pedal edema. Gastrointestinal system: Abdomen is nondistended, soft and nontender. No organomegaly or masses felt. Normal bowel sounds heard. Central nervous system: Alert and oriented. No focal neurological deficits. Extremities: Symmetric 5 x 5 power. Skin: No rashes, lesions or ulcers Psychiatry: Judgement and insight appear normal. Mood & affect appropriate.     Data Reviewed: I have personally reviewed following labs and imaging studies  CBC: Recent Labs  Lab 09/27/17 0233 09/27/17 0252 09/28/17 0539  WBC 9.5  --  10.0  NEUTROABS 4.3  --   --   HGB 14.4 15.0 13.1  HCT 43.1 44.0 40.6  MCV 95.1  --  97.6  PLT 360  --  278   Basic Metabolic Panel: Recent Labs  Lab 09/27/17 0233 09/27/17 0252 09/28/17 0539  NA 134* 136 139  K 4.5 4.2 3.9  CL 98* 97* 109  CO2 28  --  20*  GLUCOSE 115* 110* 76  BUN 15 15 8   CREATININE 1.10* 1.10* 0.98  CALCIUM 10.1  --  9.0  MG 2.1  --   --    GFR: Estimated Creatinine Clearance: 66.5 mL/min (by C-G formula based on SCr of 0.98 mg/dL). Liver Function Tests: Recent Labs  Lab 09/27/17 0233  AST 26  ALT 16  ALKPHOS 34*  BILITOT 0.7  PROT 7.9  ALBUMIN 4.0   Recent Labs  Lab 09/27/17 0905  LIPASE 23   No results for input(s): AMMONIA in the last 168 hours. Coagulation Profile: Recent Labs  Lab 09/27/17 0233  INR 0.93   Cardiac Enzymes: Recent Labs  Lab 09/27/17 0905  CKTOTAL 33*   BNP (last 3 results) No results for input(s): PROBNP in the last 8760 hours. HbA1C: No results for input(s): HGBA1C in the  last 72 hours. CBG: Recent Labs  Lab 09/27/17 2137 09/28/17 0819 09/28/17 1147 09/28/17 1653 09/28/17 2120  GLUCAP 111* 149* 92 196* 105*   Lipid Profile: No results for input(s): CHOL, HDL, LDLCALC, TRIG, CHOLHDL, LDLDIRECT in the last 72 hours. Thyroid Function Tests: Recent Labs    09/27/17 0905  TSH 2.849  FREET4 0.68   Anemia Panel: No results for input(s): VITAMINB12, FOLATE, FERRITIN, TIBC, IRON, RETICCTPCT in the last 72 hours. Sepsis Labs: Recent Labs  Lab 09/27/17 1610  LATICACIDVEN 1.4    Recent Results (from the past 240 hour(s))  MRSA PCR Screening     Status: None   Collection Time: 09/27/17  6:25 AM  Result Value Ref Range Status   MRSA by PCR NEGATIVE NEGATIVE Final    Comment:        The GeneXpert MRSA Assay (FDA approved for NASAL specimens only), is one component of a comprehensive MRSA colonization surveillance program. It is not intended to diagnose MRSA infection nor to guide or monitor treatment for MRSA infections.   Culture, blood (Routine X 2) w Reflex to ID Panel     Status: None (Preliminary result)   Collection Time: 09/27/17 12:38 PM  Result Value Ref Range Status   Specimen Description BLOOD RIGHT HAND  Final   Special Requests   Final    BOTTLES DRAWN AEROBIC AND ANAEROBIC Blood Culture adequate volume   Culture NO GROWTH < 24 HOURS  Final   Report Status PENDING  Incomplete  Culture, blood (Routine X 2) w Reflex to ID Panel     Status: None (Preliminary result)   Collection Time: 09/27/17 12:50 PM  Result Value Ref Range Status   Specimen Description BLOOD RIGHT HAND  Final   Special Requests   Final    BOTTLES DRAWN AEROBIC ONLY Blood Culture results may not be optimal due to an inadequate volume of blood received in culture bottles   Culture NO GROWTH < 24 HOURS  Final   Report Status PENDING  Incomplete         Radiology Studies: Mr Laqueta Jean Wo Contrast  Result Date: 09/27/2017 CLINICAL DATA:  46 year old female  status post seizure and fall. Headache. Personal history of trigeminal neuralgia decompression in 2009. EXAM: MRI HEAD WITHOUT AND WITH CONTRAST TECHNIQUE: Multiplanar, multiecho pulse sequences of the brain and surrounding structures were obtained without and with intravenous contrast. CONTRAST:  10mL MULTIHANCE GADOBENATE DIMEGLUMINE 529 MG/ML IV SOLN COMPARISON:  Head and cervical spine CT 0316 hours today. [facility] Brain MRI 03/27/2015. FINDINGS: Brain: Cerebral volume is stable and within normal  limits. No restricted diffusion to suggest acute infarction. No midline shift, mass effect, evidence of mass lesion, ventriculomegaly, extra-axial collection or acute intracranial hemorrhage. Cervicomedullary junction and pituitary are within normal limits. Sequelae of left suboccipital craniectomy as seen on the CT earlier today. A small wedge-shaped area of chronic abnormal T2 and FLAIR hyperintensity in the left lateral cerebellum is unchanged. Elsewhere gray and white matter signal appears normal for age. Thin slice coronal T2 weighted images show bilateral hippocampal formations signal and morphology to be symmetric and within normal limits. No abnormal enhancement identified. No dural thickening. Vascular: Major intracranial vascular flow voids are stable since 2016 and within normal limits. Skull and upper cervical spine: Negative visualized cervical spine. Stable visualized osseous structures. Sinuses/Orbits: Normal orbits soft tissues. Mild posterior ethmoid and sphenoid sinus mucosal thickening is new since 2016. Other paranasal sinuses remain clear. Other: Mastoid air cells are clear. Visible internal auditory structures appear normal. Mild postoperative changes to the left posterior suboccipital scalp soft tissues. IMPRESSION: 1.  No acute intracranial abnormality. 2. Stable and normal MRI appearance of the brain aside from chronic left posterior fossa postoperative changes with mild left  lateral cerebellar malacia. Electronically Signed   By: Odessa FlemingH  Hall M.D.   On: 09/27/2017 12:45   Ct Abdomen Pelvis W Contrast  Result Date: 09/28/2017 CLINICAL DATA:  Pt c/o falling a lot today and states she has been having multiple mini seizures yesterday; pt also c/o right eye pain and pain to forehead after falling and striking her head on the floor; n/v since admission; diabetes;. Episode of hematochezia over the weekend. EXAM: CT ABDOMEN AND PELVIS WITH CONTRAST TECHNIQUE: Multidetector CT imaging of the abdomen and pelvis was performed using the standard protocol following bolus administration of intravenous contrast. CONTRAST:  100mL ISOVUE-300 IOPAMIDOL (ISOVUE-300) INJECTION 61% COMPARISON:  CT 11/04/2016 FINDINGS: Lower chest: Lung bases are clear. Hepatobiliary: No focal hepatic lesion. No biliary duct dilatation. Gallbladder is normal. Common bile duct is normal. Pancreas: Pancreas is normal. No ductal dilatation. No pancreatic inflammation. Spleen: Normal spleen Adrenals/urinary tract: Adrenal glands and kidneys are normal. Large simple cyst of the RIGHT kidney measuring 6.8 cm. The ureters and bladder normal. Bladder is mildly distended Stomach/Bowel: Stomach, small-bowel and terminal ileum are normal. Appendix is not identified. The ascending colon is distended by stool. Stool fills and distends the transverse colon. The descending colon is collapsed as well sigmoid colon rectum. No obstructing lesions identified. Vascular/Lymphatic: Abdominal aorta is normal caliber. There is no retroperitoneal or periportal lymphadenopathy. No pelvic lymphadenopathy. Reproductive: Post hysterectomy anatomy. Other: No free fluid. Musculoskeletal: No aggressive osseous lesion. IMPRESSION: 1. Moderate to large volume stool in the ascending and transverse colon and decompressed descending colon and sigmoid colon suggest obstructing lesion at the splenic flexure however no such lesion identified. Therefore favor  findings to represent constipation (no lesion identified on CT 11/04/2016). In patient with hematochezia, consider colonoscopy for further evaluation. 2. Large RIGHT renal cyst. 3. Post hysterectomy. 4. Mild distention of the bladder. Electronically Signed   By: Genevive BiStewart  Edmunds M.D.   On: 09/28/2017 09:56        Scheduled Meds: . cholecalciferol  1,000 Units Oral QID  . citalopram  40 mg Oral Daily  . gabapentin  400 mg Oral TID  . hydrocortisone  25 mg Rectal BID  . levETIRAcetam  1,500 mg Oral BID  . polyvinyl alcohol  1-2 drop Both Eyes Daily  . venlafaxine XR  150 mg Oral Q breakfast  Continuous Infusions: . sodium chloride 100 mL/hr at 09/28/17 1807     LOS: 0 days    Time spent: 25 minutes    Katrinka Blazing, MD [facility] Pager [phone]  If 7PM-7AM, please contact night-coverage [email or web address] Password [facility] 09/29/2017, 7:54 AM

## 2017-09-29 NOTE — Progress Notes (Signed)
Progress Note  Patient Name: Mariah Ibarra Date of Encounter: 09/29/2017  Consulting Cardiologist: Jonelle Sidle, MD  Subjective   Continues to complain of overall fatigue. She has improvement in her vision now. No chest pain or syncope.  Inpatient Medications    Scheduled Meds: . cholecalciferol  1,000 Units Oral QID  . citalopram  40 mg Oral Daily  . gabapentin  400 mg Oral TID  . hydrocortisone  25 mg Rectal BID  . levETIRAcetam  1,500 mg Oral BID  . polyvinyl alcohol  1-2 drop Both Eyes Daily  . venlafaxine XR  150 mg Oral Q breakfast   Continuous Infusions: . sodium chloride 100 mL/hr at 09/28/17 1807   PRN Meds: atropine, magnesium hydroxide, ondansetron (ZOFRAN) IV   Vital Signs    Vitals:   09/29/17 0500 09/29/17 0600 09/29/17 0700 09/29/17 0818  BP: (!) 148/99 (!) 188/128 (!) 183/130   Pulse: (!) 38 (!) 58 (!) 54   Resp: 19 (!) 22 18   Temp:    97.9 F (36.6 C)  TempSrc:    Oral  SpO2: 98% 93% 93%   Weight: 157 lb 13.6 oz (71.6 kg)     Height:        Intake/Output Summary (Last 24 hours) at 09/29/2017 0917 Last data filed at 09/29/2017 0843 Gross per 24 hour  Intake 1920 ml  Output -  Net 1920 ml   Filed Weights   09/27/17 0633 09/28/17 0500 09/29/17 0500  Weight: 146 lb 2.6 oz (66.3 kg) 146 lb 2.6 oz (66.3 kg) 157 lb 13.6 oz (71.6 kg)    Telemetry    Sinus bradycardia with heart rate largely 40-60 range. Personally Reviewed.  Physical Exam   GEN: No acute distress.   Neck: No JVD Cardiac: RRR,bradycardia,  no murmurs, rubs, or gallops.  Respiratory: Clear to auscultation bilaterally. GI: Soft, nontender, non-distended  MS: No edema; No deformity.   Labs    Chemistry Recent Labs  Lab 09/27/17 0233 09/27/17 0252 09/28/17 0539  NA 134* 136 139  K 4.5 4.2 3.9  CL 98* 97* 109  CO2 28  --  20*  GLUCOSE 115* 110* 76  BUN 15 15 8   CREATININE 1.10* 1.10* 0.98  CALCIUM 10.1  --  9.0  PROT 7.9  --   --   ALBUMIN 4.0  --    --   AST 26  --   --   ALT 16  --   --   ALKPHOS 34*  --   --   BILITOT 0.7  --   --   GFRNONAA 59*  --  >60  GFRAA >60  --  >60  ANIONGAP 8  --  10     Hematology Recent Labs  Lab 09/27/17 0233 09/27/17 0252 09/28/17 0539  WBC 9.5  --  10.0  RBC 4.53  --  4.16  HGB 14.4 15.0 13.1  HCT 43.1 44.0 40.6  MCV 95.1  --  97.6  MCH 31.8  --  31.5  MCHC 33.4  --  32.3  RDW 13.0  --  13.5  PLT 360  --  278    Cardiac EnzymesNo results for input(s): TROPONINI in the last 168 hours.  Recent Labs  Lab 09/27/17 0302  TROPIPOC 0.00     Radiology    Mr Laqueta Jean ZO Contrast  Result Date: 09/27/2017 CLINICAL DATA:  46 year old female status post seizure and fall. Headache. Personal history of trigeminal neuralgia decompression  in 2009. EXAM: MRI HEAD WITHOUT AND WITH CONTRAST TECHNIQUE: Multiplanar, multiecho pulse sequences of the brain and surrounding structures were obtained without and with intravenous contrast. CONTRAST:  10mL MULTIHANCE GADOBENATE DIMEGLUMINE 529 MG/ML IV SOLN COMPARISON:  Head and cervical spine CT 0316 hours today. [facility]. FINDINGS: Brain: Cerebral volume is stable and within normal limits. No restricted diffusion to suggest acute infarction. No midline shift, mass effect, evidence of mass lesion, ventriculomegaly, extra-axial collection or acute intracranial hemorrhage. Cervicomedullary junction and pituitary are within normal limits. Sequelae of left suboccipital craniectomy as seen on the CT earlier today. A small wedge-shaped area of chronic abnormal T2 and FLAIR hyperintensity in the left lateral cerebellum is unchanged. Elsewhere gray and white matter signal appears normal for age. Thin slice coronal T2 weighted images show bilateral hippocampal formations signal and morphology to be symmetric and within normal limits. No abnormal enhancement identified. No dural thickening. Vascular: Major intracranial vascular flow voids are  stable since 2016 and within normal limits. Skull and upper cervical spine: Negative visualized cervical spine. Stable visualized osseous structures. Sinuses/Orbits: Normal orbits soft tissues. Mild posterior ethmoid and sphenoid sinus mucosal thickening is new since 2016. Other paranasal sinuses remain clear. Other: Mastoid air cells are clear. Visible internal auditory structures appear normal. Mild postoperative changes to the left posterior suboccipital scalp soft tissues. IMPRESSION: 1.  No acute intracranial abnormality. 2. Stable and normal MRI appearance of the brain aside from chronic left posterior fossa postoperative changes with mild left lateral cerebellar malacia. Electronically Signed   By: Odessa FlemingH  Hall M.D.   On: 09/27/2017 12:45   Ct Abdomen Pelvis W Contrast  Result Date: 09/28/2017 CLINICAL DATA:  Pt c/o falling a lot today and states she has been having multiple mini seizures yesterday; pt also c/o right eye pain and pain to forehead after falling and striking her head on the floor; n/v since admission; diabetes;. Episode of hematochezia over the weekend. EXAM: CT ABDOMEN AND PELVIS WITH CONTRAST TECHNIQUE: Multidetector CT imaging of the abdomen and pelvis was performed using the standard protocol following bolus administration of intravenous contrast. CONTRAST:  100mL ISOVUE-300 IOPAMIDOL (ISOVUE-300) INJECTION 61% COMPARISON:  CT 11/04/2016 FINDINGS: Lower chest: Lung bases are clear. Hepatobiliary: No focal hepatic lesion. No biliary duct dilatation. Gallbladder is normal. Common bile duct is normal. Pancreas: Pancreas is normal. No ductal dilatation. No pancreatic inflammation. Spleen: Normal spleen Adrenals/urinary tract: Adrenal glands and kidneys are normal. Large simple cyst of the RIGHT kidney measuring 6.8 cm. The ureters and bladder normal. Bladder is mildly distended Stomach/Bowel: Stomach, small-bowel and terminal ileum are normal. Appendix is not identified. The ascending colon is  distended by stool. Stool fills and distends the transverse colon. The descending colon is collapsed as well sigmoid colon rectum. No obstructing lesions identified. Vascular/Lymphatic: Abdominal aorta is normal caliber. There is no retroperitoneal or periportal lymphadenopathy. No pelvic lymphadenopathy. Reproductive: Post hysterectomy anatomy. Other: No free fluid. Musculoskeletal: No aggressive osseous lesion. IMPRESSION: 1. Moderate to large volume stool in the ascending and transverse colon and decompressed descending colon and sigmoid colon suggest obstructing lesion at the splenic flexure however no such lesion identified. Therefore favor findings to represent constipation (no lesion identified on CT 11/04/2016). In patient with hematochezia, consider colonoscopy for further evaluation. 2. Large RIGHT renal cyst. 3. Post hysterectomy. 4. Mild distention of the bladder. Electronically Signed   By: Genevive BiStewart  Edmunds M.D.   On: 09/28/2017 09:56    Cardiac Studies   Echocardiogram  09/28/2017: Study Conclusions  - Left ventricle: The cavity size was normal. Wall thickness was normal. Systolic function was normal. The estimated ejection fraction was in the range of 55% to 60%. Wall motion was normal; there were no regional wall motion abnormalities. - Mitral valve: There was trivial regurgitation. - Right atrium: Central venous pressure (est): 3 mm Hg. - Atrial septum: No defect or patent foramen ovale was identified. - Tricuspid valve: There was mild regurgitation. - Pulmonary arteries: PA peak pressure: 25 mm Hg (S). - Pericardium, extracardiac: There was no pericardial effusion.  Impressions:  - Normal LV wall thickness with LVEF 55-60%, indeterminate diastolic function. Trivial mitral regurgitation. Mild tricuspid regurgitation with PASP estimated 25 mmHg.  Patient Profile     46 y.o. female with a hx ofanxiety, hyperlipidemia, hypertension, type 2 diabetes, seizures,  CVA, PTSD and panic attacks, previous stroke withmemory loss,who is being seen for the evaluation ofbradycardia.  Assessment & Plan    1. Sinus bradycardia: Not on an AV nodal blocking agents. Has had atropine intermittently when heart rate into upper 30s, but no syncope, heart block, or pauses. Last dose this am around 6:30 AM. HR trend on telemetry between 40-60. She is asymptomatic currently.  No clear indication for pacemaker at this particular time.  2. Chronic Seizures; Followed by neurology. She admits to not taking anti-seizure medications for over 2 months as she was afraid of side effects. She was taking Hemp Oil instead. She continued to take diazepam.   3. Hypertension: She is not on her outpatient medication.   Signed, Bettey MareKathryn M. Liborio NixonLawrence DNP, ANP, AACC   09/29/2017, 9:17 AM     Attending note:  Patient seen and examined.  I reviewed her interval hospital course and discussed the case with Ms. Liborio NixonLawrence DNP.  Patient's heart rate generally runs in the 40s-60s in sinus bradycardia.  She has an occasional dip into the upper 30s and has received atropine intermittently although shows no definite heart block or pauses, and has not had syncope with these episodes.  On examination she is in no distress.  Heart rate is in the 50s in sinus rhythm by telemetry which I personally reviewed.  Blood pressure has been consistently high. Lungs are clear.  Cardiac exam reveals RRR without gallop.  Lab work shows potassium 3.9, BUN 8, creatinine 0.98, hemoglobin 13.1, platelets 278.  Patient still does not have definite indication pacemaker, no pauses or heart block noted, no syncope with her low heart rates.  Continue to avoid AV nodal blockers.  Consider other precipitating factors as discussed in previous notes (question OSA or increased vagal tone).  With sustained hypertension would resume Prinzide which she was on as an outpatient.  Increase activity as well.  Jonelle SidleSamuel G. Citlally Captain, M.D.,  F.A.C.C.

## 2017-09-30 ENCOUNTER — Inpatient Hospital Stay (HOSPITAL_COMMUNITY): Payer: Medicare Other

## 2017-09-30 LAB — COMPREHENSIVE METABOLIC PANEL
ALBUMIN: 3.4 g/dL — AB (ref 3.5–5.0)
ALK PHOS: 37 U/L — AB (ref 38–126)
ALT: 17 U/L (ref 14–54)
AST: 21 U/L (ref 15–41)
Anion gap: 9 (ref 5–15)
BUN: 5 mg/dL — ABNORMAL LOW (ref 6–20)
CALCIUM: 9 mg/dL (ref 8.9–10.3)
CO2: 22 mmol/L (ref 22–32)
CREATININE: 0.87 mg/dL (ref 0.44–1.00)
Chloride: 105 mmol/L (ref 101–111)
GFR calc Af Amer: >60 mL/min (ref >60)
GFR calc non Af Amer: >60 mL/min (ref >60)
GLUCOSE: 113 mg/dL — AB (ref 65–99)
Potassium: 3.5 mmol/L (ref 3.5–5.1)
SODIUM: 136 mmol/L (ref 135–145)
Total Bilirubin: 0.5 mg/dL (ref 0.3–1.2)
Total Protein: 6.6 g/dL (ref 6.5–8.1)

## 2017-09-30 LAB — CBC
HCT: 41.1 % (ref 36.0–46.0)
HEMOGLOBIN: 13.6 g/dL (ref 12.0–15.0)
MCH: 31.3 pg (ref 26.0–34.0)
MCHC: 33.1 g/dL (ref 30.0–36.0)
MCV: 94.5 fL (ref 78.0–100.0)
Platelets: 300 10*3/uL (ref 150–400)
RBC: 4.35 MIL/uL (ref 3.87–5.11)
RDW: 13 % (ref 11.5–15.5)
WBC: 14.8 10*3/uL — AB (ref 4.0–10.5)

## 2017-09-30 LAB — GLUCOSE, CAPILLARY
GLUCOSE-CAPILLARY: 122 mg/dL — AB (ref 65–99)
GLUCOSE-CAPILLARY: 117 mg/dL — AB (ref 65–99)
GLUCOSE-CAPILLARY: 147 mg/dL — AB (ref 65–99)
Glucose-Capillary: 101 mg/dL — ABNORMAL HIGH (ref 65–99)

## 2017-09-30 LAB — MAGNESIUM: Magnesium: 1.7 mg/dL (ref 1.7–2.4)

## 2017-09-30 MED ORDER — MORPHINE SULFATE (PF) 2 MG/ML IV SOLN: 2.0000 mg | INTRAVENOUS | Status: DC | PRN

## 2017-09-30 MED ORDER — LACOSAMIDE 50 MG PO TABS
50.0000 mg | ORAL_TABLET | Freq: Two times a day (BID) | ORAL | Status: DC
  Administered 2017-09-30 – 2017-10-01 (×3): 50 mg via ORAL
  Filled 2017-09-30 (×3): qty 1

## 2017-09-30 MED ORDER — LACOSAMIDE 50 MG PO TABS: 50.0000 mg | ORAL_TABLET | Freq: Two times a day (BID) | ORAL | Status: DC

## 2017-09-30 MED ORDER — LORAZEPAM 2 MG/ML IJ SOLN
INTRAMUSCULAR | Status: DC
Start: 2017-09-30 — End: 2017-09-30
  Filled 2017-09-30: qty 1

## 2017-09-30 MED ORDER — LORAZEPAM 2 MG/ML IJ SOLN
1.0000 mg | INTRAMUSCULAR | Status: DC | PRN
  Administered 2017-10-02: 1 mg via INTRAVENOUS
  Filled 2017-09-30 (×2): qty 1

## 2017-09-30 MED ORDER — LORAZEPAM 2 MG/ML IJ SOLN
INTRAMUSCULAR | Status: AC
  Administered 2017-09-30: 05:00:00
  Filled 2017-09-30: qty 1

## 2017-09-30 MED ORDER — ENSURE ENLIVE PO LIQD
237.0000 mL | Freq: Three times a day (TID) | ORAL | Status: DC
  Administered 2017-09-30 – 2017-10-02 (×7): 237 mL via ORAL

## 2017-09-30 MED ORDER — SODIUM CHLORIDE 0.9 % IV SOLN
50.0000 mg | Freq: Two times a day (BID) | INTRAVENOUS | Status: DC
  Filled 2017-09-30 (×7): qty 5

## 2017-09-30 NOTE — Progress Notes (Addendum)
Progress Note  Patient Name: Mariah Ibarra Date of Encounter: 09/30/2017  Consulting Cardiologist: Jonelle SidleSamuel G. McDowell, MD  Subjective   Weakness and confusion, has had recurrent seizures. Also reportedly having hallucinations  last night per husband at bedside.  Inpatient Medications    Scheduled Meds: . cholecalciferol  1,000 Units Oral QID  . citalopram  40 mg Oral Daily  . feeding supplement (ENSURE ENLIVE)  237 mL Oral BID BM  . gabapentin  400 mg Oral TID  . hydrochlorothiazide  25 mg Oral Daily  . hydrocortisone  25 mg Rectal BID  . levETIRAcetam  1,500 mg Oral BID  . lisinopril  20 mg Oral Daily  . polyvinyl alcohol  1-2 drop Both Eyes Daily  . venlafaxine XR  150 mg Oral Q breakfast   . lacosamide (VIMPAT) IV     PRN Meds: acetaminophen, atropine, LORazepam, magnesium hydroxide, morphine injection, ondansetron (ZOFRAN) IV   Vital Signs    Vitals:   09/30/17 0400 09/30/17 0415 09/30/17 0500 09/30/17 0600  BP: (!) 134/91   (!) 153/106  Pulse: (!) 43   (!) 41  Resp: 16   16  Temp:  98 F (36.7 C)    TempSrc:  Oral    SpO2: 100%   100%  Weight:   157 lb 13.6 oz (71.6 kg)   Height:        Intake/Output Summary (Last 24 hours) at 09/30/2017 0903 Last data filed at 09/29/2017 2300 Gross per 24 hour  Intake 240 ml  Output -  Net 240 ml   Filed Weights   09/28/17 0500 09/29/17 0500 09/30/17 0500  Weight: 146 lb 2.6 oz (66.3 kg) 157 lb 13.6 oz (71.6 kg) 157 lb 13.6 oz (71.6 kg)    Telemetry    Sinus bradycardia rates in the 40's and 50's.  Personally Reviewed  Physical Exam   GEN: Chronically ill appearing. No acute distress.   Neck: No JVD Cardiac: RRR, no gallop.  Respiratory: Clear to auscultation bilaterally. GI: Soft, nontender, non-distended  MS: No edema; No deformity.  Labs    Chemistry Recent Labs  Lab 09/27/17 0233 09/27/17 0252 09/28/17 0539 09/30/17 0604  NA 134* 136 139 136  K 4.5 4.2 3.9 3.5  CL 98* 97* 109 105  CO2  28  --  20* 22  GLUCOSE 115* 110* 76 113*  BUN 15 15 8  5*  CREATININE 1.10* 1.10* 0.98 0.87  CALCIUM 10.1  --  9.0 9.0  PROT 7.9  --   --  6.6  ALBUMIN 4.0  --   --  3.4*  AST 26  --   --  21  ALT 16  --   --  17  ALKPHOS 34*  --   --  37*  BILITOT 0.7  --   --  0.5  GFRNONAA 59*  --  >60 >60  GFRAA >60  --  >60 >60  ANIONGAP 8  --  10 9     Hematology Recent Labs  Lab 09/27/17 0233 09/27/17 0252 09/28/17 0539 09/30/17 0604  WBC 9.5  --  10.0 14.8*  RBC 4.53  --  4.16 4.35  HGB 14.4 15.0 13.1 13.6  HCT 43.1 44.0 40.6 41.1  MCV 95.1  --  97.6 94.5  MCH 31.8  --  31.5 31.3  MCHC 33.4  --  32.3 33.1  RDW 13.0  --  13.5 13.0  PLT 360  --  278 300    Cardiac  EnzymesNo results for input(s): TROPONINI in the last 168 hours.  Recent Labs  Lab 09/27/17 0302  TROPIPOC 0.00     Radiology    Ct Head Wo Contrast  Result Date: 09/30/2017 CLINICAL DATA:  Seizure falling face first on floor with facial trauma. EXAM: CT HEAD WITHOUT CONTRAST CT MAXILLOFACIAL WITHOUT CONTRAST TECHNIQUE: Multidetector CT imaging of the head and maxillofacial structures were performed using the standard protocol without intravenous contrast. Multiplanar CT image reconstructions of the maxillofacial structures were also generated. COMPARISON:  Head CT and brain MRI 3 days prior 09/27/2017 FINDINGS: CT HEAD FINDINGS Brain: Minimal left cerebellar encephalomalacia. No intracranial hemorrhage, mass effect, or midline shift. No hydrocephalus. The basilar cisterns are patent. No evidence of territorial infarct or acute ischemia. No extra-axial or intracranial fluid collection. Vascular: No hyperdense vessel or unexpected calcification. Skull: Left occipital craniotomy.  No fracture or focal lesion. Other: None. CT MAXILLOFACIAL FINDINGS Osseous: Zygomatic arches, nasal bone, and mandibles are intact. The temporomandibular joints are intact, mild chronic anterior subluxation of the mandible in the left, unchanged  from 11/02/2016 CT. Orbits: Both orbits and globes are intact.  No orbital fracture. Sinuses: Minimal mucosal thickening of right sphenoid sinus. Otherwise clear. Soft tissues: Negative. IMPRESSION: 1.  No acute intracranial abnormality. 2. No acute facial bone fracture. Electronically Signed   By: Rubye OaksMelanie  Ehinger M.D.   On: 09/30/2017 05:12   Ct Maxillofacial Wo Contrast  Result Date: 09/30/2017 CLINICAL DATA:  Seizure falling face first on floor with facial trauma. EXAM: CT HEAD WITHOUT CONTRAST CT MAXILLOFACIAL WITHOUT CONTRAST TECHNIQUE: Multidetector CT imaging of the head and maxillofacial structures were performed using the standard protocol without intravenous contrast. Multiplanar CT image reconstructions of the maxillofacial structures were also generated. COMPARISON:  Head CT and brain MRI 3 days prior 09/27/2017 FINDINGS: CT HEAD FINDINGS Brain: Minimal left cerebellar encephalomalacia. No intracranial hemorrhage, mass effect, or midline shift. No hydrocephalus. The basilar cisterns are patent. No evidence of territorial infarct or acute ischemia. No extra-axial or intracranial fluid collection. Vascular: No hyperdense vessel or unexpected calcification. Skull: Left occipital craniotomy.  No fracture or focal lesion. Other: None. CT MAXILLOFACIAL FINDINGS Osseous: Zygomatic arches, nasal bone, and mandibles are intact. The temporomandibular joints are intact, mild chronic anterior subluxation of the mandible in the left, unchanged from 11/02/2016 CT. Orbits: Both orbits and globes are intact.  No orbital fracture. Sinuses: Minimal mucosal thickening of right sphenoid sinus. Otherwise clear. Soft tissues: Negative. IMPRESSION: 1.  No acute intracranial abnormality. 2. No acute facial bone fracture. Electronically Signed   By: Rubye OaksMelanie  Ehinger M.D.   On: 09/30/2017 05:12    Cardiac Studies   Echocardiogram 09/28/2017: Study Conclusions  - Left ventricle: The cavity size was normal. Wall  thickness was normal. Systolic function was normal. The estimated ejection fraction was in the range of 55% to 60%. Wall motion was normal; there were no regional wall motion abnormalities. - Mitral valve: There was trivial regurgitation. - Right atrium: Central venous pressure (est): 3 mm Hg. - Atrial septum: No defect or patent foramen ovale was identified. - Tricuspid valve: There was mild regurgitation. - Pulmonary arteries: PA peak pressure: 25 mm Hg (S). - Pericardium, extracardiac: There was no pericardial effusion.  Impressions:  - Normal LV wall thickness with LVEF 55-60%, indeterminate diastolic function. Trivial mitral regurgitation. Mild tricuspid regurgitation with PASP estimated 25 mmHg.  Patient Profile     46 y.o. female  with a hx ofanxiety, hyperlipidemia, hypertension, type 2 diabetes, seizures,  CVA, PTSD and panic attacks, previous stroke withmemory loss,who is being seen for the evaluation ofbradycardia.  Assessment & Plan    1. Sinus bradycardia:  Stable last 24 hours. Has not received atropine per primary team for last 24 hours. She has had no pauses or heart block. Not on any AVN blockers.  2. Chronic Seizures: Followed by neurology. She has two seizures last night requiring Ativan. CT scan was completed.as she fell to her right face and head.  No acute intercranial abnormality or fracture. Plans to transfer to [facility] primary team. Cardiology can follow there.   3. Hypertension: Started back on Lisinopril and HCTZ yesterday. Blood pressure trend is better.  Signed, Bettey Mare. Liborio Nixon, ANP, AACC   09/30/2017, 9:03 AM      Attending note:  Patient seen and examined.  Reviewed interval hospital course and discussed case with Ms. Lyman Bishop DNP as well as Dr. Rinaldo Ratel.  Patient remains in sinus rhythm with bradycardia in the 40s-50s, she has not received any atropine per primary team (heart rate periodically has dipped into the high 30s).   She has had no pauses or heart block based on review of telemetry.  She is not on any AV nodal blockers at this time.  Chart indicates that she had recurrent seizure activity overnight, had a follow-up head CT and was also treated with Ativan.  Significant other indicates that she has also been confused and was reportedly hallucinating, talking to people that were not there last night.   Lisinopril and HCTZ were resumed yesterday with blood pressure control improving.  Dr. Rinaldo Ratel is planning to transfer the patient to the internal medicine service at [facility] for further neurology consultation.  Cardiology will continue to follow bradycardia, there has been no clear indication for pacemaker as yet.  Jonelle Sidle, M.D., F.A.C.C.

## 2017-09-30 NOTE — Progress Notes (Signed)
PROGRESS NOTE    Mariah SchlatterMelinda F Ephraim Ibarra Mariah Ibarra  JXB:[id number] DOB: 11/14/1970 DOA: 09/27/2017 [name], [name], MD    Brief Narrative:  46 year old female with a history of seizure disorder, hypertension, hyperlipidemia, anxiety/panic attacks presenting with syncope and nausea and vomiting.  The patient had numerous episodes of nausea and vomiting that began on September 21, 2017 without any hematemesis.  She also complained of lower abdominal pain without dysuria or hematuria.  She denied any fevers, chills, chest pain, shortness breath, cough, hemoptysis.  Her nausea and vomiting improved, and she has not had any emesis for the past 3 days prior to admission.  However, for some reason the patient quit taking her Keppra over the weekend, and she has not taken it for 3 days prior to admission.  The patient began feeling dizzy.  To treat her nausea and dizziness, the patient began taking over-the-counter Dramamine 4-5 times per day  starting on Thanksgiving day.  Since then, the patient had multiple falls and syncopal episodes.  According to her significant other, many of her syncopal episodes occur without any prodromal type symptoms as she is getting up to go to the bathroom.  However during a couple of episodes, he noted that the patient had some "shaking"which would last only a few seconds.  He states that during some of these episodes the patient would be awake, and speak nonsensically.  In addition, she has had some episodes where she had some prodromal symptom of blurry vision from her right eye.  Over the past week, she has had very poor oral intake, but denies any diarrhea.  In fact she has felt constipated.  As a result, the patient had some hematochezia over the weekend.  She denies any melena or hematemesis or hematuria.  In the emergency department, the patient was afebrile hemodynamically stable.  She was noted to be bradycardic with heart rate 37.  She was given 2 doses of atropine.  BMP, CBC, and  LFTs were essentially unremarkable.  EKG shows sinus rhythm with early repolarization.   Patient was seen by cardiology for her repeated episodes of bradycardia.  She required intermittent doses of atropine to increase her pulse rate from the low 30s to at least 50 bpm.  She was also seen by neurology and underwent EEG which did not show any epileptiform activity.  GI was also consulted for further evaluation of her bright red blood per rectum which she voices she had intermittently during her hospitalization.    Assessment & Plan:   Principal Problem:   Symptomatic sinus bradycardia Active Problems:   Trigeminal neuralgia   Hypertension   Bradycardia   Hyperlipidemia   Hyponatremia   Seizure (HCC)   Anxiety   Volume depletion   Syncope and collapse   UTI (urinary tract infection)   Left lower quadrant pain   Seizure-like activity (HCC)   Symptomatic bradycardia   Syncope - heart rates in the 40s-50s most of the day yesterday but did not require any doses of atropine - [facility]  Further evaluation -Echocardiogram showing ejection fraction of 55-60% without regional wall motion abnormality no valvular abnormalities -Monitor on telemetry -Urine drug screen positive for benzodiazepines -EEG normal -PT consult  Bradycardia -Cardiology consult -TSH normal -Echocardiogram results as above -She has not required any doses of atropine in the last 24 hours but her heart rate has maintained in the 40s-50s  Seizure disorder/Seizure like activity -Patient will [facility]  by neurology given her 2 seizures this morning -Continue Keppra and gabapentin -Vimpat added by neurology this morning  Hypotension -Resolved - Blood cultures x2 showing no growth for 3 days -Lactic acid of 1.4 -urine culture showing no growth  Essential hypertension -Continue Prinzide   Hematochezia/abdominal pain -H&H has remained  stable  Hyponatremia -Resolved  Depression/Anxiety - Continue Celexa,Effexor     DVT prophylaxis: SCDs Code Status: Full code Family Communication: Husband is bedside Disposition Plan: transferring to [facility] when bed available   Consultants:   Neurology  Cardiology  Gastroenterology  Procedures:   EEG on 09/27/2017: Results pending Echocardiogram on 09/27/2017:Normal LV wall thickness with LVEF 55-60%, indeterminate diastolic function. Trivial mitral regurgitation. Mild tricuspid regurgitation with PASP estimated 25 mmHg.  Antimicrobials:   None   Subjective: Patient had 2 seizures this morning-1 1 restroom the other was when she was in route to CT scan for evaluation of a fall that she had during her first seizure.  Patient and her husband are in agreement to transfer to [facility] for further evaluation by neurology and cardiology which could include electrophysiology team.  Patient also voices that she started to have some hallucinations in which she seen her parents who have recently passed away.   Objective: Vitals:   09/30/17 0400 09/30/17 0415 09/30/17 0500 09/30/17 0600  BP: (!) 134/91   (!) 153/106  Pulse: (!) 43   (!) 41  Resp: 16   16  Temp:  98 F (36.7 C)    TempSrc:  Oral    SpO2: 100%   100%  Weight:   71.6 kg (157 lb 13.6 oz)   Height:        Intake/Output Summary (Last 24 hours) at 09/30/2017 0757 Last data filed at 09/29/2017 2300 Gross per 24 hour  Intake 1020 ml  Output -  Net 1020 ml   Filed Weights   09/28/17 0500 09/29/17 0500 09/30/17 0500  Weight: 66.3 kg (146 lb 2.6 oz) 71.6 kg (157 lb 13.6 oz) 71.6 kg (157 lb 13.6 oz)    Examination:  General exam: Appears fatigued, mild distress Respiratory system: Clear to auscultation. Respiratory effort normal. Cardiovascular system: S1 & S2 heard, bradycardic. No JVD, murmurs, rubs, gallops or clicks. No pedal edema. Gastrointestinal system: Abdomen is nondistended, soft and  nontender. No organomegaly or masses felt. Normal bowel sounds heard. Central nervous system: Alert and oriented. No focal neurological deficits. Extremities: Symmetric 5 x 5 power. Skin: Ecchymoses on left side of face from fall during seizure Psychiatry: Judgement and insight appear normal. Mood & affect appropriate.     Data Reviewed: I have personally reviewed following labs and imaging studies  CBC: Recent Labs  Lab 09/27/17 0233 09/27/17 0252 09/28/17 0539 09/30/17 0604  WBC 9.5  --  10.0 14.8*  NEUTROABS 4.3  --   --   --   HGB 14.4 15.0 13.1 13.6  HCT 43.1 44.0 40.6 41.1  MCV 95.1  --  97.6 94.5  PLT 360  --  278 300   Basic Metabolic Panel: Recent Labs  Lab 09/27/17 0233 09/27/17 0252 09/28/17 0539 09/30/17 0604  NA 134* 136 139 136  K 4.5 4.2 3.9 3.5  CL 98* 97* 109 105  CO2 28  --  20* 22  GLUCOSE 115* 110* 76 113*  BUN 15 15 8  5*  CREATININE 1.10* 1.10* 0.98 0.87  CALCIUM 10.1  --  9.0 9.0  MG 2.1  --   --  1.7  GFR: Estimated Creatinine Clearance: 74.9 mL/min (by C-G formula based on SCr of 0.87 mg/dL). Liver Function Tests: Recent Labs  Lab 09/27/17 0233 09/30/17 0604  AST 26 21  ALT 16 17  ALKPHOS 34* 37*  BILITOT 0.7 0.5  PROT 7.9 6.6  ALBUMIN 4.0 3.4*   Recent Labs  Lab 09/27/17 0905  LIPASE 23   No results for input(s): AMMONIA in the last 168 hours. Coagulation Profile: Recent Labs  Lab 09/27/17 0233  INR 0.93   Cardiac Enzymes: Recent Labs  Lab 09/27/17 0905  CKTOTAL 33*   BNP (last 3 results) No results for input(s): PROBNP in the last 8760 hours. HbA1C: No results for input(s): HGBA1C in the last 72 hours. CBG: Recent Labs  Lab 09/29/17 0749 09/29/17 1131 09/29/17 1630 09/29/17 2322 09/30/17 0353  GLUCAP 114* 94 84 111* 122*   Lipid Profile: No results for input(s): CHOL, HDL, LDLCALC, TRIG, CHOLHDL, LDLDIRECT in the last 72 hours. Thyroid Function Tests: Recent Labs    09/27/17 0905  TSH 2.849    FREET4 0.68   Anemia Panel: No results for input(s): VITAMINB12, FOLATE, FERRITIN, TIBC, IRON, RETICCTPCT in the last 72 hours. Sepsis Labs: Recent Labs  Lab 09/27/17 [id number]  LATICACIDVEN 1.4    Recent Results (from the past 240 hour(s))  MRSA PCR Screening     Status: None   Collection Time: 09/27/17  6:25 AM  Result Value Ref Range Status   MRSA by PCR NEGATIVE NEGATIVE Final    Comment:        The GeneXpert MRSA Assay (FDA approved for NASAL specimens only), is one component of a comprehensive MRSA colonization surveillance program. It is not intended to diagnose MRSA infection nor to guide or monitor treatment for MRSA infections.   Culture, blood (Routine X 2) w Reflex to ID Panel     Status: None (Preliminary result)   Collection Time: 09/27/17 12:38 PM  Result Value Ref Range Status   Specimen Description BLOOD RIGHT HAND  Final   Special Requests   Final    BOTTLES DRAWN AEROBIC AND ANAEROBIC Blood Culture adequate volume   Culture NO GROWTH 2 DAYS  Final   Report Status PENDING  Incomplete  Culture, blood (Routine X 2) w Reflex to ID Panel     Status: None (Preliminary result)   Collection Time: 09/27/17 12:50 PM  Result Value Ref Range Status   Specimen Description BLOOD RIGHT HAND  Final   Special Requests   Final    BOTTLES DRAWN AEROBIC ONLY Blood Culture results may not be optimal due to an inadequate volume of blood received in culture bottles   Culture NO GROWTH 2 DAYS  Final   Report Status PENDING  Incomplete  Culture, Urine     Status: None   Collection Time: 09/27/17  7:30 PM  Result Value Ref Range Status   Specimen Description URINE, RANDOM  Final   Special Requests NONE  Final   Culture   Final    NO GROWTH Performed at [facility], [address] [place], [place] [address]    Report Status 09/29/2017 FINAL  Final         Radiology Studies: Ct Head Wo Contrast  Result Date: 09/30/2017 CLINICAL DATA:  Seizure falling face  first on floor with facial trauma. EXAM: CT HEAD WITHOUT CONTRAST CT MAXILLOFACIAL WITHOUT CONTRAST TECHNIQUE: Multidetector CT imaging of the head and maxillofacial structures were performed using the standard protocol without intravenous contrast. Multiplanar  CT image reconstructions of the maxillofacial structures were also generated. COMPARISON:  Head CT and brain MRI 3 days prior 09/27/2017 FINDINGS: CT HEAD FINDINGS Brain: Minimal left cerebellar encephalomalacia. No intracranial hemorrhage, mass effect, or midline shift. No hydrocephalus. The basilar cisterns are patent. No evidence of territorial infarct or acute ischemia. No extra-axial or intracranial fluid collection. Vascular: No hyperdense vessel or unexpected calcification. Skull: Left occipital craniotomy.  No fracture or focal lesion. Other: None. CT MAXILLOFACIAL FINDINGS Osseous: Zygomatic arches, nasal bone, and mandibles are intact. The temporomandibular joints are intact, mild chronic anterior subluxation of the mandible in the left, unchanged from 11/02/2016 CT. Orbits: Both orbits and globes are intact.  No orbital fracture. Sinuses: Minimal mucosal thickening of right sphenoid sinus. Otherwise clear. Soft tissues: Negative. IMPRESSION: 1.  No acute intracranial abnormality. 2. No acute facial bone fracture. Electronically Signed   By: Rubye Oaks M.D.   On: 09/30/2017 05:12   Ct Maxillofacial Wo Contrast  Result Date: 09/30/2017 CLINICAL DATA:  Seizure falling face first on floor with facial trauma. EXAM: CT HEAD WITHOUT CONTRAST CT MAXILLOFACIAL WITHOUT CONTRAST TECHNIQUE: Multidetector CT imaging of the head and maxillofacial structures were performed using the standard protocol without intravenous contrast. Multiplanar CT image reconstructions of the maxillofacial structures were also generated. COMPARISON:  Head CT and brain MRI 3 days prior 09/27/2017 FINDINGS: CT HEAD FINDINGS Brain: Minimal left cerebellar encephalomalacia.  No intracranial hemorrhage, mass effect, or midline shift. No hydrocephalus. The basilar cisterns are patent. No evidence of territorial infarct or acute ischemia. No extra-axial or intracranial fluid collection. Vascular: No hyperdense vessel or unexpected calcification. Skull: Left occipital craniotomy.  No fracture or focal lesion. Other: None. CT MAXILLOFACIAL FINDINGS Osseous: Zygomatic arches, nasal bone, and mandibles are intact. The temporomandibular joints are intact, mild chronic anterior subluxation of the mandible in the left, unchanged from 11/02/2016 CT. Orbits: Both orbits and globes are intact.  No orbital fracture. Sinuses: Minimal mucosal thickening of right sphenoid sinus. Otherwise clear. Soft tissues: Negative. IMPRESSION: 1.  No acute intracranial abnormality. 2. No acute facial bone fracture. Electronically Signed   By: Rubye Oaks M.D.   On: 09/30/2017 05:12        Scheduled Meds: . cholecalciferol  1,000 Units Oral QID  . citalopram  40 mg Oral Daily  . feeding supplement (ENSURE ENLIVE)  237 mL Oral BID BM  . gabapentin  400 mg Oral TID  . hydrochlorothiazide  25 mg Oral Daily  . hydrocortisone  25 mg Rectal BID  . levETIRAcetam  1,500 mg Oral BID  . lisinopril  20 mg Oral Daily  . polyvinyl alcohol  1-2 drop Both Eyes Daily  . venlafaxine XR  150 mg Oral Q breakfast   Continuous Infusions:    LOS: 1 day    Time spent: 40 minutes    Katrinka Blazing, MD [facility] Pager [phone]  If 7PM-7AM, please contact night-coverage [email or web address] Password [email or web address] 09/30/2017, 7:57 AM

## 2017-09-30 NOTE — Progress Notes (Signed)
Initial Nutrition Assessment  DOCUMENTATION CODES:      INTERVENTION:  Encourage Ensure Enlive po TID, each supplement provides 350 kcal and 20 grams of protein    NUTRITION DIAGNOSIS:   Unintentional weight loss related to altered GI function, poor appetite(hx of unplanned wt loss and poor po's) as evidenced by meal completion < 50%, per patient/family report, percent weight loss 10% x 8 months.   GOAL:   Patient will meet greater than or equal to 90% of their needs  MONITOR:   PO intake, Supplement acceptance, Weight trends  REASON FOR ASSESSMENT:   Malnutrition Screening Tool    ASSESSMENT:   Pt with PMH of HTN, PTSD, type II DM, seizures, short-term memory loss from her brain surgery, trigeminal neuralgia presents s/p multiple falls and seizures/blackouts found symptomatic sinus bradycardia. She has been assessed by neurology and cardiology as well as GI team.   She reports very poor appetite and has hx of wt loss. Evaluated (EGD) by GI back in March. At that time her weight was 175 lb and now she weighs 157 lb which is 10% loss in 8 month. This is not significant for timeframe.   According to pt she cut out sodas and mostly drinks a product called Muscle Milk at home and eats very little solid foods. This morning she was able to eat 100% of her oatmeal and drank her juice. She drank 100% of Ensure last night.   The patient says [facility] but doesn't take it because she doesn't like the way it makes her feel.   NUTRITION - FOCUSED PHYSICAL EXAM:    Most Recent Value  Orbital Region  No depletion  Upper Arm Region  No depletion  Thoracic and Lumbar Region  No depletion  Buccal Region  Mild depletion  Temple Region  No depletion  Clavicle Bone Region  No depletion  Clavicle and Acromion Bone Region  No depletion  Dorsal Hand  No depletion  Anterior Thigh Region  No depletion  Posterior Calf Region  No depletion  Edema (RD Assessment)   None  Hair  Reviewed  Eyes  Reviewed  Mouth  Reviewed  Skin  Reviewed  Nails  Reviewed       Diet Order:  Diet full liquid Room service appropriate? Yes; Fluid consistency: Thin  EDUCATION NEEDS:   Education needs have been addressed  Skin:  Skin Assessment: Reviewed RN Assessment  Last BM:  11/29  Height:   Ht Readings from Last 1 Encounters:  09/27/17 5\' 2"  (1.575 m)    Weight:   Wt Readings from Last 1 Encounters:  09/30/17 157 lb 13.6 oz (71.6 kg)    Ideal Body Weight:  50 kg  BMI:  Body mass index is 28.87 kg/m.  Estimated Nutritional Needs:   Kcal:  1650-1850  Protein:  80-95 grams  Fluid:  >/= 1.6 L/d    Royann ShiversLynn Julieanna Geraci MS,RD,CSG,LDN Office: [phone] Pager: [phone]

## 2017-09-30 NOTE — Progress Notes (Signed)
Pt admitted to 68M - alert CHG bath done

## 2017-09-30 NOTE — Progress Notes (Addendum)
0402 In to see patient at this time. The patient was noted to be face down having a seizure. The seizure lasted approximately 2 min with extremity jerking of all extremities, unresponsiveness, and a clinched mouth.  The patient was assisted to her right side, and suction for airway clearance as needed.  The patient was assisted by four staff members  back in bed. The patient was placed back in bed without complication. Shortly after the seizure, the patient was post ictal and slow to respond. Within 4-135min the patient slowly began to answer questions. The patient was able to move all extremities, with her right upper orbital eye socket noted to have swelling and right upper cheek. The patient had no further swelling or bruising noted on post fall assessment, and only complained of the right side of her face hurting. Also during post fall assessment, the patient was noted to have unilateral pupil size changes Right 6mm sluggish and Left 5 sluggish. MD Opyd notified at this time of the patient current condition. The husband Mr. Donoso was present at bedisde throughout this situation, and was updated on MD plan. Patient spouse has no further questions at this time. STAT head CT ordered, Ativan 1mg  IVP prn Q4 needed for seizure, and morphine IVP prn for pain.   0430- Prior to transport patient had another seizure at this time. Patient given 1mg  Ativan IVP. The patient had a therapeutic response, and was taken to CT via two RN on monitor, atropine prn if needed, and ativan.   0500- The patient was transported to CT without complications, no further seizure activity noted at this time.   0500- MD notified of patient current condition, second seizure, and informed of patient vital signs.

## 2017-09-30 NOTE — Progress Notes (Signed)
[facility]. Report given to Florentina AddisonKatie, Charity fundraiserN. Belongings sent with husband.

## 2017-10-01 DIAGNOSIS — E782 Mixed hyperlipidemia: Secondary | ICD-10-CM

## 2017-10-01 DIAGNOSIS — F419 Anxiety disorder, unspecified: Secondary | ICD-10-CM

## 2017-10-01 DIAGNOSIS — S161XXA Strain of muscle, fascia and tendon at neck level, initial encounter: Secondary | ICD-10-CM

## 2017-10-01 LAB — GLUCOSE, CAPILLARY
GLUCOSE-CAPILLARY: 110 mg/dL — AB (ref 65–99)
GLUCOSE-CAPILLARY: 123 mg/dL — AB (ref 65–99)
Glucose-Capillary: 98 mg/dL (ref 65–99)

## 2017-10-01 MED ORDER — SODIUM CHLORIDE 0.9 % IV SOLN
INTRAVENOUS | Status: DC
  Administered 2017-10-01 (×2): via INTRAVENOUS

## 2017-10-01 NOTE — Progress Notes (Addendum)
PROGRESS NOTE    Mariah SchlatterMelinda F [facility]  [name] DOB: 11/11/1970 DOA: 09/27/2017 PCP: Mariah Ibarra    Brief Narrative:  46 year old female with a history of seizure disorder, hypertension, hyperlipidemia, anxiety/panic attacks presenting with syncope and nausea and vomiting.  The patient had numerous episodes of nausea and vomiting that began on September 21, 2017. patient quit taking her Keppra over the weekend, and she has not taken it for 3 days prior to admission.  To treat her nausea and dizziness,she took some over-the-counter Dramamine 4-5 times per day  starting on Thanksgiving day.  Since then, the patient had multiple falls and syncopal episodes.  According to her significant other, many of her syncopal episodes occur without any prodromal type symptoms as she is getting up to go to the bathroom.   Over the past week, she has had very poor oral intake, but denies any diarrhea. In addition to near syncopal spells she had 1 seizure, prior to admission, 2 seizures in the ED and another one yesterday at [facility]. In [facility] also noted to be bradycardic, but workup unremarkable  In the emergency department, the patient was afebrile hemodynamically stable.  She was noted to be bradycardic with heart rate 37.  She was given 2 doses of atropine. [facility] her repeated episodes of bradycardia.  She required intermittent doses of atropine to increase her pulse rate from the low 30s to at least 50 bpm.  She was also seen by neurology and started on VImpat in addition to restarting her Keppra and gabapentin, she also underwent EEG which did not show any epileptiform activity.  [facility] and Neuro eval  Assessment & Plan:   Recurrent seizures -I suspect she had recurrent seizures due to recent nausea and vomiting and not being able to take seizure meds for 3days -MRI without acute changes, chronic changes noted -EEG without epileptiform  discharges -restarted Keppra/gabapentin, im not sure she needs additional AEDs  i.e Vimpat-just started now, Neuro consult requested, d/w Dr.Arora, he will FU, recommended stopping VImpat esp with cardiac bradycardia etc -very drowsy from all AEDs  Syncope/Hypotension/Bradycardia -likely due to hypotension/bradycardia, suspect some dehydration from recent Nausea/vomtiing, diuretics and increased vagal tone contributing -seen by Cards -Stop lisinopril and HCTZ -gentle IVf today -also check Am cortisol -ECHO unremarkable, TSH normal too  Essential hypertension --now hypotensive, stop ACE/HCTZ  Hematochezia/abdominal pain -H&H has remained stable, CT unremarkable except constipation -no need for GI workup inpatient, recommend FU with GI outpatient  Hyponatremia -Resolved  Depression/Anxiety - Continue Celexa,Effexor  DVT prophylaxis: SCDs Code Status: Full code Family Communication: Husband is bedside Disposition Plan: Tx to tele   Consultants:   Neurology  Cardiology   Procedures:   EEG on 09/27/2017: Results pending Echocardiogram on 09/27/2017:Normal LV wall thickness with LVEF 55-60%, indeterminate diastolic function. Trivial mitral regurgitation. Mild tricuspid regurgitation with PASP estimated 25 mmHg.  Antimicrobials:   None   Subjective: Patient had 2 seizures this morning-1 1 restroom the other was when she was in route to CT scan for evaluation of a fall that she had during her first seizure.  Patient and her husband are in agreement to [facility] for further evaluation by neurology and cardiology which could include electrophysiology team.  Patient also voices that she started to have some hallucinations in which she seen her parents who have recently passed away.   Objective: Vitals:   09/30/17 2300 10/01/17  0300 10/01/17 0720 10/01/17 0907  BP: 94/70 (!) 82/37 (!) 132/91 (!) 86/73  Pulse: (!) 56 60 (!) 53   Resp: 17       Temp: 98.6 F (37 C) 98.6 F (37 C)    TempSrc: Oral Oral    SpO2: 100% 97% 96%   Weight:      Height:       No intake or output data in the 24 hours ending 10/01/17 0927 Filed Weights   09/29/17 0500 09/30/17 0500 09/30/17 1651  Weight: 71.6 kg (157 lb 13.6 oz) 71.6 kg (157 lb 13.6 oz) 65.4 kg (144 lb 2.9 oz)    Examination:  General exam: Appears fatigued, mild distress Respiratory system: Clear to auscultation. Respiratory effort normal. Cardiovascular system: S1 & S2 heard, bradycardic. No JVD, murmurs, rubs, gallops or clicks. No pedal edema. Gastrointestinal system: Abdomen is nondistended, soft and nontender. No organomegaly or masses felt. Normal bowel sounds heard. Central nervous system: Alert and oriented. No focal neurological deficits. Extremities: Symmetric 5 x 5 power. Skin: Ecchymoses on left side of face from fall during seizure Psychiatry: Judgement and insight appear normal. Mood & affect appropriate.     Data Reviewed: I have personally reviewed following labs and imaging studies  CBC: Recent Labs  Lab 09/27/17 0233 09/27/17 0252 09/28/17 0539 09/30/17 0604  WBC 9.5  --  10.0 14.8*  NEUTROABS 4.3  --   --   --   HGB 14.4 15.0 13.1 13.6  HCT 43.1 44.0 40.6 41.1  MCV 95.1  --  97.6 94.5  PLT 360  --  278 300   Basic Metabolic Panel: Recent Labs  Lab 09/27/17 0233 09/27/17 0252 09/28/17 0539 09/30/17 0604  NA 134* 136 139 136  K 4.5 4.2 3.9 3.5  CL 98* 97* 109 105  CO2 28  --  20* 22  GLUCOSE 115* 110* 76 113*  BUN 15 15 8  5*  CREATININE 1.10* 1.10* 0.98 0.87  CALCIUM 10.1  --  9.0 9.0  MG 2.1  --   --  1.7   GFR: Estimated Creatinine Clearance: 71.7 mL/min (by C-G formula based on SCr of 0.87 mg/dL). Liver Function Tests: Recent Labs  Lab 09/27/17 0233 09/30/17 0604  AST 26 21  ALT 16 17  ALKPHOS 34* 37*  BILITOT 0.7 0.5  PROT 7.9 6.6  ALBUMIN 4.0 3.4*   Recent Labs  Lab 09/27/17 0905  LIPASE 23   No results for  input(s): AMMONIA in the last 168 hours. Coagulation Profile: Recent Labs  Lab 09/27/17 0233  INR 0.93   Cardiac Enzymes: Recent Labs  Lab 09/27/17 0905  CKTOTAL 33*   BNP (last 3 results) No results for input(s): PROBNP in the last 8760 hours. HbA1C: No results for input(s): HGBA1C in the last 72 hours. CBG: Recent Labs  Lab 09/30/17 0353 09/30/17 0756 09/30/17 1156 09/30/17 2038 10/01/17 0812  GLUCAP 122* 117* 101* 147* 98   Lipid Profile: No results for input(s): CHOL, HDL, LDLCALC, TRIG, CHOLHDL, LDLDIRECT in the last 72 hours. Thyroid Function Tests: No results for input(s): TSH, T4TOTAL, FREET4, T3FREE, THYROIDAB in the last 72 hours. Anemia Panel: No results for input(s): VITAMINB12, FOLATE, FERRITIN, TIBC, IRON, RETICCTPCT in the last 72 hours. Sepsis Labs: Recent Labs  Lab 09/27/17 0905  LATICACIDVEN 1.4    Recent Results (from the past 240 hour(s))  MRSA PCR Screening     Status: None   Collection Time: 09/27/17  6:25 AM  Result Value  Ref Range Status   MRSA by PCR NEGATIVE NEGATIVE Final    Comment:        The GeneXpert MRSA Assay (FDA approved for NASAL specimens only), is one component of a comprehensive MRSA colonization surveillance program. It is not intended to diagnose MRSA infection nor to guide or monitor treatment for MRSA infections.   Culture, blood (Routine X 2) w Reflex to ID Panel     Status: None (Preliminary result)   Collection Time: 09/27/17 12:38 PM  Result Value Ref Range Status   Specimen Description BLOOD RIGHT HAND  Final   Special Requests   Final    BOTTLES DRAWN AEROBIC AND ANAEROBIC Blood Culture adequate volume   Culture NO GROWTH 3 DAYS  Final   Report Status PENDING  Incomplete  Culture, blood (Routine X 2) w Reflex to ID Panel     Status: None (Preliminary result)   Collection Time: 09/27/17 12:50 PM  Result Value Ref Range Status   Specimen Description BLOOD RIGHT HAND  Final   Special Requests   Final     BOTTLES DRAWN AEROBIC ONLY Blood Culture results may not be optimal due to an inadequate volume of blood received in culture bottles   Culture NO GROWTH 3 DAYS  Final   Report Status PENDING  Incomplete  Culture, Urine     Status: None   Collection Time: 09/27/17  7:30 PM  Result Value Ref Range Status   Specimen Description URINE, RANDOM  Final   Special Requests NONE  Final   Culture   Final    NO GROWTH Performed at [facility], [address] [place], [place] [address]    Report Status 09/29/2017 FINAL  Final         Radiology Studies: Ct Head Wo Contrast  Result Date: 09/30/2017 CLINICAL DATA:  Seizure falling face first on floor with facial trauma. EXAM: CT HEAD WITHOUT CONTRAST CT MAXILLOFACIAL WITHOUT CONTRAST TECHNIQUE: Multidetector CT imaging of the head and maxillofacial structures were performed using the standard protocol without intravenous contrast. Multiplanar CT image reconstructions of the maxillofacial structures were also generated. COMPARISON:  Head CT and brain MRI 3 days prior 09/27/2017 FINDINGS: CT HEAD FINDINGS Brain: Minimal left cerebellar encephalomalacia. No intracranial hemorrhage, mass effect, or midline shift. No hydrocephalus. The basilar cisterns are patent. No evidence of territorial infarct or acute ischemia. No extra-axial or intracranial fluid collection. Vascular: No hyperdense vessel or unexpected calcification. Skull: Left occipital craniotomy.  No fracture or focal lesion. Other: None. CT MAXILLOFACIAL FINDINGS Osseous: Zygomatic arches, nasal bone, and mandibles are intact. The temporomandibular joints are intact, mild chronic anterior subluxation of the mandible in the left, unchanged from 11/02/2016 CT. Orbits: Both orbits and globes are intact.  No orbital fracture. Sinuses: Minimal mucosal thickening of right sphenoid sinus. Otherwise clear. Soft tissues: Negative. IMPRESSION: 1.  No acute intracranial abnormality. 2. No acute facial  bone fracture. Electronically Signed   By: Rubye Oaks M.D.   On: 09/30/2017 05:12   Ct Maxillofacial Wo Contrast  Result Date: 09/30/2017 CLINICAL DATA:  Seizure falling face first on floor with facial trauma. EXAM: CT HEAD WITHOUT CONTRAST CT MAXILLOFACIAL WITHOUT CONTRAST TECHNIQUE: Multidetector CT imaging of the head and maxillofacial structures were performed using the standard protocol without intravenous contrast. Multiplanar CT image reconstructions of the maxillofacial structures were also generated. COMPARISON:  Head CT and brain MRI 3 days prior 09/27/2017 FINDINGS: CT HEAD FINDINGS Brain: Minimal left cerebellar encephalomalacia. No intracranial  hemorrhage, mass effect, or midline shift. No hydrocephalus. The basilar cisterns are patent. No evidence of territorial infarct or acute ischemia. No extra-axial or intracranial fluid collection. Vascular: No hyperdense vessel or unexpected calcification. Skull: Left occipital craniotomy.  No fracture or focal lesion. Other: None. CT MAXILLOFACIAL FINDINGS Osseous: Zygomatic arches, nasal bone, and mandibles are intact. The temporomandibular joints are intact, mild chronic anterior subluxation of the mandible in the left, unchanged from 11/02/2016 CT. Orbits: Both orbits and globes are intact.  No orbital fracture. Sinuses: Minimal mucosal thickening of right sphenoid sinus. Otherwise clear. Soft tissues: Negative. IMPRESSION: 1.  No acute intracranial abnormality. 2. No acute facial bone fracture. Electronically Signed   By: Rubye Oaks M.D.   On: 09/30/2017 05:12        Scheduled Meds: . cholecalciferol  1,000 Units Oral QID  . citalopram  40 mg Oral Daily  . feeding supplement (ENSURE ENLIVE)  237 mL Oral TID BM  . gabapentin  400 mg Oral TID  . hydrocortisone  25 mg Rectal BID  . lacosamide  50 mg Oral BID  . levETIRAcetam  1,500 mg Oral BID  . polyvinyl alcohol  1-2 drop Both Eyes Daily  . venlafaxine XR  150 mg Oral Q  breakfast   Continuous Infusions:    LOS: 2 days    Time spent: 35 minutes    Zannie Cove, Ibarra [facility] Pager [phone]  If 7PM-7AM, please contact night-coverage [email or web address] Password [facility] 10/01/2017, 9:27 AM

## 2017-10-01 NOTE — Consult Note (Signed)
NEURO HOSPITALIST CONSULT NOTE   Requesting Physician: Dr. Jomarie Longs [facility]: Dr. Wilford Corner  Admit date: 09/27/2017    Chief Complaint: Seizures  History obtained from:  Patient and Chart     HPI:                                                                                                                                       Mariah Ibarra is an 46 y.o. female with a PMH of Seizure disorder on Keppra, Hx of polypharmacy and medication non-compliance, Hx of gamma knife surgery for Left trigeminal neuralgia, Hx of Anxiety/Panic Attacks/PTSD and Hx of CVA admitted to [facility] hospital on 09/27/17 for AMS, syncopal and seizure activity, H/A, blurred vision, bradycardia, dehydration and nausea. She was transferred to [facility] on 09/30/17 in the afternoon for Cardiology and Neurology consultation and management. EEG and imaging thus far have been negative for any acute findings  Per patient and chart review her symptoms started close to Thanksgiving. She began to experience constipation and then had intense nausea/some vomiting. She started taking Dramanine to help with the nausea and then begin to have episodes of dizziness and syncope with multiple falls at home. She thinks she may have had 1-2 seizures at home during this time. For 3-4, maybe 5 days her PO intake decreased, she became lethargic and she stopped taking her scheduled medications, including Keppra. In the ED she was found to be bradycardic in the 30's. She has been given PRN doses of Atropine. She had a witnessed seizure in the ED at [facility] on admission. Her last witnessed seizures were 09/30/17 at 0430-0500. Seizures x 2, tonic-clonic, lasting 2-3 minutes with postictal, Ativan given and Vimpat IV added to Keppra. Repeat Head CT without acute findings.  Patient has a history of medication noncompliance with her Keppra in the past per [facility] records. Patient has said she wants to find more  "natural" medications to reduce the side effects of the medications she is taking. She started taking Hemp Oil and some OTC herbal supplements over the past few months.   Past Medical History: Diagnosis Date  . Anxiety   . Hypercholesteremia   . Hypertension   . Panic attacks   . PONV (postoperative nausea and vomiting)   . PTSD (post-traumatic stress disorder)   . Seizures (HCC)    2 months since last seizure.  . Stroke [facility])    short term memory loss from 3rd brain surgery.  . Trigeminal neuralgia   . Type 2 diabetes mellitus (HCC)    Past Surgical History: Procedure Laterality Date  . ABDOMINAL HYSTERECTOMY    . ABDOMINOPLASTY    . APPENDECTOMY    . Bladder tack    . BRAIN SURGERY     x4  . CERVICAL ABLATION    . ESOPHAGOGASTRODUODENOSCOPY N/A 01/13/2017   Procedure: ESOPHAGOGASTRODUODENOSCOPY (EGD);  Surgeon: Malissa Hippo, MD;  Location: [facility] ENDO SUITE;  Service: Endoscopy;  Laterality: N/A;  3:00  . Gamma knife Procedure November 2013    . ORIF ANKLE FRACTURE Right 06/03/2016   Procedure: OPEN REDUCTION INTERNAL FIXATION (ORIF) RIGHT ANKLE;  Surgeon: Vickki Hearing, MD;  Location: [facility] ORS;  Service: Orthopedics;  Laterality: Right;  . TONSILLECTOMY     Family History: Problem Relation Age of Onset  . Depression Paternal Aunt   . Depression Paternal Uncle   . Depression Maternal Grandfather   . Ovarian cancer Maternal Grandfather   . Uterine cancer Mother   . Breast cancer Mother   . Lung cancer Father        x3  . Colon cancer Father   . Heart attack Father   . Hypertension Father   . Hyperlipidemia Father   . Leukemia Paternal Grandmother    Social History:  She  reports that  has never smoked. she has never used smokeless tobacco. She reports that she does not drink alcohol or use drugs.  Allergies:  Allergies  Allergen Reactions  . Baclofen Other (See Comments)    Seizure activity  . Lorazepam Other (See  Comments)    Doesn't tolerate   Medications:                                                                                                                        Prior to Admission:  Scheduled: . cholecalciferol  1,000 Units Oral QID  . citalopram  40 mg Oral Daily  . feeding supplement (ENSURE ENLIVE)  237 mL Oral TID BM  . gabapentin  400 mg Oral TID  . hydrocortisone  25 mg Rectal BID  . levETIRAcetam  1,500 mg Oral BID  . polyvinyl alcohol  1-2 drop Both Eyes Daily  . venlafaxine XR  150 mg Oral Q breakfast   ROS:                                                                                                                                     History obtained from the patient  General ROS: negative for - chills, fatigue, fever, night sweats, weight gain, + weight loss - reports 100+ pound loss over the past year Psychological ROS: negative for - behavioral disorder, memory difficulties, mood swings or suicidal ideation. + hallucinations per boyfriend when she is drowsy after taking her meds Ophthalmic ROS: negative for -  blurry vision, double vision, eye pain or loss of vision ENT ROS: negative for - epistaxis, nasal discharge, oral lesions, sore throat, tinnitus or vertigo Allergy and Immunology ROS: negative for - hives or itchy/watery eyes Endocrine ROS: negative for - galactorrhea, hair pattern changes, polydipsia/polyuria or temperature intolerance Respiratory ROS: negative for - cough, hemoptysis, shortness of breath or wheezing Cardiovascular ROS: negative for - chest pain, dyspnea on exertion, edema or irregular heartbeat Gastrointestinal ROS: negative for - abdominal pain, diarrhea, hematemesis, nausea/vomiting or stool incontinence +constipation Genito-Urinary ROS: negative for - dysuria, hematuria, incontinence or urinary frequency/urgency Musculoskeletal ROS: negative for - joint swelling or muscular weakness Neurological ROS: as noted in HPI Dermatological ROS:  negative for rash and skin lesion changes  Neurologic Examination:                                                                                                    Vitals:   09/30/17 2300 10/01/17 0300 10/01/17 0720 10/01/17 0907  BP: 94/70 (!) 82/37 (!) 132/91 (!) 86/73  Pulse: (!) 56 60 (!) 53   Resp: 17     Temp: 98.6 F (37 C) 98.6 F (37 C)    TempSrc: Oral Oral    SpO2: 100% 97% 96%   Weight:      Height:       HEENT-  Normocephalic, no lesions, without obvious abnormality.  Normal external eye and conjunctiva.  Normal TM's bilaterally.  Normal auditory canals and external ears. Normal external nose, mucus membranes and septum.  Normal pharynx. Cardiovascular- regular rate and rhythm, S1, S2 normal, no murmur, click, rub or gallop, pulses palpable throughout   Lungs- chest clear, no wheezing, rales, normal symmetric air entry, Heart exam - S1, S2 normal, no murmur, no gallop, rate regular Abdomen- soft, non-tender; bowel sounds normal; no masses,  no organomegaly Extremities- no joint deformities, effusion, or inflammation  Neurological Examination Mental Status: Drowsy but oriented x 3, thought content appropriate.  Speech is slow but fluent without evidence of aphasia or neglect.  Able to follow 3 step commands without difficulty. Cranial Nerves: II: Visual Fields are full. Pupils are equal, round, and reactive to light. 5-696mm bilaterally. Post surgical pupils  III,IV, VI: EOMI without ptosis or diploplia. +Rotational and horizontal nystagmus, worse to the left.   V: Facial sensation is decreased on Left with history of trigeminal neuralgia  VII: Facial movement is symmetric.  VIII: hearing is intact to voice X: Uvula elevates symmetrically XI: Shoulder shrug is symmetric. XII: tongue is midline without atrophy or fasciculations.  Motor: Tone is normal. Bulk is normal. 5/5 strength was present in all four extremities. Left side slightly weaker on Left but may be due to  poor effort Sensor: Sensation is symmetric to light touch and temperature in the arms and legs. Deep Tendon Reflexes: 2+ and symmetric UE's and 3+ in LE's.  Plantars: Toes are downgoing bilaterally.  Cerebellar: normal finger-to-nose, normal rapid alternating movements Gait: Deferred   Lab Results: CBC: Recent Labs  Lab 09/27/17 0233 09/27/17 0252 09/28/17 0539 09/30/17 0604  WBC 9.5  --  10.0 14.8*  HGB 14.4 15.0 13.1 13.6  HCT 43.1 44.0 40.6 41.1  MCV 95.1  --  97.6 94.5  PLT 360  --  278 300   Basic Metabolic Panel: Recent Labs  Lab 09/27/17 0233 09/27/17 0252 09/28/17 0539 09/30/17 0604  NA 134* 136 139 136  K 4.5 4.2 3.9 3.5  CL 98* 97* 109 105  CO2 28  --  20* 22  GLUCOSE 115* 110* 76 113*  BUN 15 15 8  5*  CREATININE 1.10* 1.10* 0.98 0.87  CALCIUM 10.1  --  9.0 9.0  MG 2.1  --   --  1.7   Liver Function Tests: Recent Labs  Lab 09/27/17 0233 09/30/17 0604  AST 26 21  ALT 16 17  ALKPHOS 34* 37*  BILITOT 0.7 0.5  PROT 7.9 6.6  ALBUMIN 4.0 3.4*   Recent Labs  Lab 09/27/17 0905  LIPASE 23   Cardiac Enzymes: Recent Labs  Lab 09/27/17 0905  CKTOTAL 33*   Urine Drug Screen:      Component Value Date/Time   LABOPIA NONE DETECTED 09/27/2017 0340   COCAINSCRNUR NONE DETECTED 09/27/2017 0340   COCAINSCRNUR NEGATIVE 08/24/2012 2115   LABBENZ POSITIVE (A) 09/27/2017 0340   LABBENZ POSITIVE (A) 08/24/2012 2115   AMPHETMU NONE DETECTED 09/27/2017 0340   THCU NONE DETECTED 09/27/2017 0340   LABBARB NONE DETECTED 09/27/2017 0340  [facility]G done at [facility]-no evidence of active seizures. Imaging: Ct Head Wo Contrast Result Date: 09/30/2017 IMPRESSION: 1.  No acute intracranial abnormality. 2. No acute facial bone fracture.   Ct Maxillofacial Wo Contrast Result Date: 09/30/2017 IMPRESSION: 1.  No acute intracranial abnormality. 2. No acute facial bone fracture.   MRI Brain 09/27/17: IMPRESSION: 1.  No acute intracranial abnormality. 2. Stable and  normal MRI appearance of the brain aside from chronic left posterior fossa postoperative changes with mild left lateral cerebellar malacia.  EEG 09/28/17 normal recording of the awake and drowsy states. No focal/lateral slowing. No epileptiform activity is observed.  Imprerssion:  Ms. Mariah Ibarra is a 46 y.o. female with PMH of Seizure disorder on Keppra, Hx of polypharmacy and medication non-compliance, Hx of gamma knife surgery for Left trigeminal neuralgia, Hx of Anxiety/Panic Attacks/PTSD and Hx of CVA admitted to [facility] hospital on 09/27/17 for AMS, syncopal and seizure activity, H/A, blurred vision, bradycardia, dehydration and nausea. She was [facility] on 09/30/17 in the afternoon for Cardiology and Neurology consultation and management. EEG and imaging negative for acute findings  Patients last witnessed seizures were 09/30/17 at 0430-0500. Seizures x 2, tonic-clonic, lasting 2-3 minutes with postictal, Ativan given and Vimpat IV added to Keppra. Repeat Head CT without acute findings.  10/01/17: On exam today patient is drowsy but oriented and interactive with examiner. Remains bradycardic. Possibly some minor weakness noted on LEFT but also may be due to poor effort. Voices no new complaints but continues to complain of generalized weakness. IV Vimpat has been discontinued upon d/w Dr Wilford CornerArora this morning by Dr Jomarie LongsJoseph.  Seizure Disorder Continue Keppra 1500 twice daily PO for now.  Continue Neurontin 400 mg 3 times daily for now as that does have significant antiepileptic effect. Discontinue Vimpat due to side effects of bradycardia Maintain seizure precautions Ativan PRN for seizure activity No need for repeat imaging or repeat EEG at this time Frequent Neurochecks  Patient may have been purposefully non-compliant with Keppra x 3 months, using herbal supplements and Hemp Oil Stressed importance of compliance  to antiepileptic medications.  Trigeminal  Neuralgia Continue Neurontin 400 mg TID Continue to HOLD Zanaflex Outpatient follow up with [facility]  Intermittent Hallucinations Likely due to Polypharmacy Continue to HOLD Valium, Remeron, Ambien Recommend inpatient Psych evaluation Recommend Medication Reconciliation to reduce Polypharmacy Maintain proper day/night time hours and sleep hygiene  Depression/Anxiety/PTSD/Insomnia Continue Celexa, Effexor Continue to HOLD Valium, Remeron and Ambien Recommend inpatient Psych evaluation Recommend Medication Reconciliation to reduce Polypharmacy Consult PT/OT/SLP  New onset Syncope & Bradycardia Management per Cardiology ECHO no acute findings Telemetry Monitoring  FAMILY UPDATES: Boyfriend at bedside  TEAM UPDATES: Dr Mitchel Honour  Assessment and plan discussed with with attending physician and they are in agreement.    Beryl Meager, ANP-C [facility] [phone]  10/01/2017, 10:47 AM  Attending Neurohospitalist Addendum Patient seen and examined. Agree with the history and physical as documented above. Agree with the plan as documented, which I helped formulate.  I have read the note and made the necessary edits. I have independently obtained history, review of system and examined the patient.  I have also reviewed the pertinent imaging including CT head, MRI head and EEG results from [facility].  Impression Toxic metabolic encephalopathy -likely secondary to polypharmacy Seizure disorder with breakthrough seizure in the setting of noncompliance to medications  My recommendations are as below: -Continue with Keppra 1500 twice daily, gabapentin 400 3 times daily -Discontinue Vimpat as it can have cardiac side effects and she already is bradycardic. -Maintain seizure precautions -Her paper medication list had multiple medications with sedating side effects including Zanaflex, Valium, Ambien, Remeron.  I would recommend discontinuing all of  these. -I think she will benefit from an inpatient psychiatry consult, especially when she is already on Celexa and Effexor. -I think polypharmacy is contributing a lot to her current clinical presentation including syncope and also probably might be contributing to her some dramatic bradycardia and the altered mental status/drowsiness. -Management of symptomatic bradycardia per cardiology  Patient has been informed of the [place] state law for no driving for at least 6 months seizure-free. She verbalized understanding.  Her significant other was in the room also and verbalized understanding. Seizure precautions were discussed.  --- Milon Dikes, MD [facility] [phone]  If 7pm to 7am, please call on call as listed on AMION.

## 2017-10-01 NOTE — Progress Notes (Signed)
Subjective:  Not able to awaken her.  She has been quite sedated.  Blood pressure is on the low side now.  Objective:  Vital Signs in the last 24 hours: BP (!) 86/73   Pulse (!) 53   Temp 98.6 F (37 C) (Oral)   Resp 17   Ht 5\' 2"  (1.575 m)   Wt 65.4 kg (144 lb 2.9 oz)   LMP 11/01/2013   SpO2 96%   BMI 26.37 kg/m   Physical Exam: Sedated white female Lungs:  Clear  Cardiac:  Regular rhythm, normal S1 and S2, no S3 Extremities:  No edema present  Intake/Output from previous day: No intake/output data recorded. Weight Filed Weights   09/29/17 0500 09/30/17 0500 09/30/17 1651  Weight: 71.6 kg (157 lb 13.6 oz) 71.6 kg (157 lb 13.6 oz) 65.4 kg (144 lb 2.9 oz)    Lab Results: Basic Metabolic Panel: Recent Labs    09/30/17 0604  NA 136  K 3.5  CL 105  CO2 22  GLUCOSE 113*  BUN 5*  CREATININE 0.87    CBC: Recent Labs    09/30/17 0604  WBC 14.8*  HGB 13.6  HCT 41.1  MCV 94.5  PLT 300   Telemetry: Sinus bradycardia, personally reviewed by me.  No high degree or other AV block noted.  Assessment/Plan:  1.  Recent recurrent seizures on seizure medicine 2.  Previous sinus bradycardia in the setting of syncope and seizures  Recommendations:  Her heart rate appears stable overnight although she still has sinus bradycardia.  She is quite sedated and unable to awaken today.  Continue to observe.     Darden PalmerW. Spencer Tilley, Jr.  MD [facility] Cardiology  10/01/2017, 11:50 AM

## 2017-10-02 ENCOUNTER — Inpatient Hospital Stay (HOSPITAL_COMMUNITY): Payer: Medicare Other

## 2017-10-02 LAB — CBC
HEMATOCRIT: 39.6 % (ref 36.0–46.0)
HEMOGLOBIN: 13.3 g/dL (ref 12.0–15.0)
MCH: 31.7 pg (ref 26.0–34.0)
MCHC: 33.6 g/dL (ref 30.0–36.0)
MCV: 94.5 fL (ref 78.0–100.0)
Platelets: 334 10*3/uL (ref 150–400)
RBC: 4.19 MIL/uL (ref 3.87–5.11)
RDW: 13.3 % (ref 11.5–15.5)
WBC: 10.7 10*3/uL — ABNORMAL HIGH (ref 4.0–10.5)

## 2017-10-02 LAB — CULTURE, BLOOD (ROUTINE X 2)
CULTURE: NO GROWTH
Culture: NO GROWTH
SPECIAL REQUESTS: ADEQUATE

## 2017-10-02 LAB — BASIC METABOLIC PANEL
Anion gap: 9 (ref 5–15)
BUN: 9 mg/dL (ref 6–20)
CHLORIDE: 106 mmol/L (ref 101–111)
CO2: 20 mmol/L — ABNORMAL LOW (ref 22–32)
Calcium: 8.4 mg/dL — ABNORMAL LOW (ref 8.9–10.3)
Creatinine, Ser: 0.99 mg/dL (ref 0.44–1.00)
GFR calc Af Amer: >60 mL/min (ref >60)
GFR calc non Af Amer: >60 mL/min (ref >60)
GLUCOSE: 106 mg/dL — AB (ref 65–99)
POTASSIUM: 3.6 mmol/L (ref 3.5–5.1)
Sodium: 135 mmol/L (ref 135–145)

## 2017-10-02 LAB — GLUCOSE, CAPILLARY
GLUCOSE-CAPILLARY: 141 mg/dL — AB (ref 65–99)
GLUCOSE-CAPILLARY: 92 mg/dL (ref 65–99)
Glucose-Capillary: 95 mg/dL (ref 65–99)
Glucose-Capillary: 122 mg/dL — ABNORMAL HIGH (ref 65–99)
Glucose-Capillary: 86 mg/dL (ref 65–99)
Glucose-Capillary: 99 mg/dL (ref 65–99)

## 2017-10-02 LAB — APTT: aPTT: 27 seconds (ref 24–36)

## 2017-10-02 LAB — PROTIME-INR
INR: 0.95
Prothrombin Time: 12.6 seconds (ref 11.4–15.2)

## 2017-10-02 LAB — TROPONIN I
Troponin I: <0.03 ng/mL (ref <0.03)
Troponin I: <0.03 ng/mL (ref <0.03)

## 2017-10-02 LAB — CORTISOL: Cortisol, Plasma: 10.7 ug/dL

## 2017-10-02 MED ORDER — CITALOPRAM HYDROBROMIDE 20 MG PO TABS: 20.0000 mg | ORAL_TABLET | Freq: Every day | ORAL | Status: DC

## 2017-10-02 MED ORDER — VENLAFAXINE HCL ER 75 MG PO CP24: 75.0000 mg | ORAL_CAPSULE | Freq: Every day | ORAL | Status: DC

## 2017-10-02 MED ORDER — SODIUM CHLORIDE 0.9 % IV SOLN
INTRAVENOUS | Status: DC
  Administered 2017-10-02 – 2017-10-03 (×4): via INTRAVENOUS

## 2017-10-02 MED ORDER — SODIUM CHLORIDE 0.9 % IV BOLUS (SEPSIS)
250.0000 mL | Freq: Once | INTRAVENOUS | Status: AC
  Administered 2017-10-02: 250 mL via INTRAVENOUS

## 2017-10-02 MED ORDER — SODIUM CHLORIDE 0.9 % IV SOLN
500.0000 mg | INTRAVENOUS | Status: AC
  Administered 2017-10-02: 500 mg via INTRAVENOUS
  Filled 2017-10-02: qty 5

## 2017-10-02 MED ORDER — SODIUM CHLORIDE 0.9 % IV BOLUS (SEPSIS): 250.0000 mL | Freq: Once | INTRAVENOUS | Status: DC

## 2017-10-02 NOTE — Progress Notes (Signed)
NEURO HOSPITALIST PROGRESS NOTE  Subjective: Patient found laying in bed in NAD. More alert and interactive today. Per report code stroke activated for acute onset on Left sided weakness overnight. CT Head negative. Seizures reported x 2. Patient states she is slowly starting to feel better, but still weak.  Exam: Temp:  [97.5 F (36.4 C)-98 F (36.7 C)] 97.5 F (36.4 C) (12/02 0834) Pulse Rate:  [54-79] 68 (12/02 0834) Resp:  [11-20] 15 (12/02 0834) BP: (77-144)/(51-94) 99/71 (12/02 0834) SpO2:  [97 %-100 %] 100 % (12/02 0834)  HEENT-  Normocephalic, no lesions, without obvious abnormality.   Cardiovascular- regular rate and rhythm,  Lungs- chest clear, no wheezing,  Abdomen- soft, non-tender; bowel sounds normal; Extremities- no joint deformities, effusion, or inflammation   Neurological Examination Mental Status: Awake, eyes open,oriented x 3, thought content appropriate.  Speech is not as slow today, continues to be fluent without evidence of aphasia or neglect.  Able to follow 3 step commands without difficulty. Cranial Nerves: II: Visual Fields are full. Pupils are equal, round, and reactive to light. 5-66mm bilaterally. Post surgical pupils  III,IV, VI: EOMI without ptosis or diploplia. +Rotational and horizontal nystagmus improving slowly, worse to the left.   V: Facial sensation is decreased on Left with history of trigeminal neuralgia  VII: Face is symmetric at rest but upon asking her to smile and puff her cheeks, she volitionally refrains from using the left side of her face as strongly as the right. VIII: hearing is intact to voice X: Uvula elevates symmetrically XI: Shoulder shrug is symmetric. XII: tongue is midline without atrophy or fasciculations.  Motor: Tone is normal. Bulk is normal. 5/5 strength was present in all four extremities. Left side slightly weaker on Left but again may be due to poor effort Sensor: Sensation is symmetric to light touch and  temperature in the arms and legs. Deep Tendon Reflexes: 2+ and symmetric UE's and 3+ in LE's.  Plantars: Toes are downgoing bilaterally.  Cerebellar:  finger-to-nose is ataxic on left but appears due to poor effort,  Gait: Deferred  Pertinent Labs/Diagnostics:  Recent Labs  Lab 09/27/17 0233 09/27/17 0252 09/28/17 0539 09/30/17 0604 10/02/17 0348  NA 134* 136 139 136 135  K 4.5 4.2 3.9 3.5 3.6  CL 98* 97* 109 105 106  CO2 28  --  20* 22 20*  GLUCOSE 115* 110* 76 113* 106*  BUN 15 15 8  5* 9  CREATININE 1.10* 1.10* 0.98 0.87 0.99  CALCIUM 10.1  --  9.0 9.0 8.4*  MG 2.1  --   --  1.7  --    Recent Labs  Lab 09/27/17 0233 09/27/17 0252 09/28/17 0539 09/30/17 0604 10/02/17 0348  WBC 9.5  --  10.0 14.8* 10.7*  NEUTROABS 4.3  --   --   --   --   HGB 14.4 15.0 13.1 13.6 13.3  HCT 43.1 44.0 40.6 41.1 39.6  MCV 95.1  --  97.6 94.5 94.5  PLT 360  --  278 300 334   IMAGING: I have personally reviewed the radiological images below and agree with the radiology interpretations.  Imaging: Ct Head Wo Contrast Result Date: 09/30/2017 IMPRESSION: 1.  No acute intracranial abnormality. 2. No acute facial bone fracture.   Ct Maxillofacial Wo Contrast Result Date: 09/30/2017 IMPRESSION: 1.  No acute intracranial abnormality. 2. No acute facial bone fracture.   MRI Brain 09/27/17: IMPRESSION: 1.  No acute intracranial abnormality. 2. Stable and normal MRI appearance of  the brain aside from chronic left posterior fossa postoperative changes with mild left lateral cerebellar malacia.  EEG 09/28/17 normal recording of the awake and drowsy states. No focal/lateral slowing. No epileptiform activity is observed.  Ct Head Code Stroke Wo Contrast Result Date: 10/02/2017 IMPRESSION: 1. No acute intracranial infarct or other abnormality identified. 2. ASPECTS is 10.   Impression:  Mariah Ibarra is a 46 y.o. female with PMH of Seizure disorder on Keppra, Hx of polypharmacy and  medication non-compliance, Hx of gamma knife surgery for Left trigeminal neuralgia, Hx of Anxiety/Panic Attacks/PTSD and Hx of CVA admitted [facility] on 09/27/17 for AMS, syncopal and seizure activity, H/A, blurred vision, bradycardia, dehydration and nausea. [facility] in the afternoon for Cardiology and Neurology consultation and management. EEG and all imaging thus far are negative for acute findings. Patient may have been purposefully non-compliant with Keppra x 3 months, using herbal supplements and Hemp Oil  Witnessed seizures 09/30/17 at 0430-0500. Seizures x 2, tonic-clonic, lasting 2-3 minutes with postictal, Ativan given and Vimpat IV added to Keppra. Repeat Head CT without acute findings.  Code stroke was activated due to left-sided weakness on 10/02/17 at 0310, CT Head Negative  Witnessed seizure on route to CT, described by nursing as tonic-clonic.+incontinent of urine. immediately after Head CT she had a second seizure and was given 1 mg of IV Ativan, Additional dose of 500 mg IV Keppra given. No further seizures reported today.  10/01/17: On exam today patient is drowsy but oriented and interactive with examiner. Remains bradycardic. Possibly some minor weakness noted on LEFT but also may be due to poor effort. Voices no new complaints but continues to complain of generalized weakness. IV Vimpat has been discontinued upon d/w Dr Wilford CornerArora this morning by Dr Jomarie LongsJoseph.  10/02/17: Acute onset of Left side weakness and two seizures reported early this AM. CT Head negative, additional dose of IV Keppra given. Neuro exam remains stable. Exam is non-focal and patient continues to give varying degrees of effort on testing. No new acute findings  Seizure Disorder w/ breakthrough seizure - setting of noncompliance to medications Continue Keppra 1500 twice daily PO for now.  Continue Neurontin 400 mg 3 times daily for now as that does have significant  antiepileptic effect. Do not use Vimpat as it can have cardiac side effects and she already is bradycardic. Maintain seizure precautions Ativan PRN for seizure activity No need for repeat imaging or repeat EEG at this time Frequent Neurochecks  Stressed importance of compliance to antiepileptic medications. Patient has been informed of the [place] state law for no driving for at least 6 months seizure-free. She verbalized understanding.  Her significant other was in the room also and verbalized understanding. Seizure precautions were discussed.  Trigeminal Neuralgia Continue Neurontin 400 mg TID Continue to HOLD Zanaflex Outpatient follow up with [facility]/ [facility]  Toxic metabolic encephalopathy -likely secondary to polypharmacy Intermittent Hallucinations - none reported today by RN/boyfriend Likely due to Polypharmacy - Continue to HOLD Valium, Remeron, Ambien She will benefit from an inpatient psychiatry consult, especially when she is already on Celexa and Effexor. Polypharmacy is contributing to her current clinical presentation including syncope and also probably might be contributing to her some dramatic bradycardia and the altered mental status/drowsiness. Recommend Medication Reconciliation to reduce Polypharmacy Maintain proper day/night time hours and sleep hygiene  Depression/Anxiety/PTSD/Insomnia Continue Celexa, Effexor Continue to HOLD Valium, Remeron and Ambien Recommend inpatient Psych  evaluation Recommend Medication Reconciliation to reduce Polypharmacy Consult PT/OT/SLP- needs to get OOB/ambulate  New onset Syncope & Bradycardia Management per Cardiology ECHO no acute findings Telemetry Monitoring- Bradycardia improved today  FAMILY UPDATES: Boyfriend at bedside  TEAM UPDATES: Dr Mitchel HonourP Joseph   Neurology to sign off. Please call with any further questions or concerns. Thank you for this consultation.  Mary A Costello. ANP-C [facility] 10/02/2017, 9:52 AM   Attending Neurohospitalist Addendum Patient seen and examined. Agree with the history and physical as documented above. Agree with the plan as documented, which I helped formulate. I have independently obtaining history, and review of systems on my exam and the patient and reviewed the chart including pertinent labs and pertinent imaging.  To summarize the assessment and plan -Continue with Keppra and gabapentin for her seizures -Minimize sedating medications as much as possible -I think she would benefit from an inpatient psychiatry consult for better management of her psychiatric medications which probably are playing a major role in her altered mental status. -I suspect that on top of having real seizures, she does have nonepileptic seizures as well. -Might benefit from outpatient long-term EEG -[place] state law regarding driving and seizure precautions were discussed in detail. -Management of bradycardia per primary team and cardiology as you are.  I would avoid Vimpat and Dilantin given bradycardia/cardiac issues.  --- Milon DikesAshish Hajra Port, MD [facility] [phone]  If 7pm to 7am, please call on call as listed on AMION.

## 2017-10-02 NOTE — Significant Event (Signed)
Code stroke was activated due to left-sided weakness.  She states that she has left-sided weakness at baseline, but that her face is slightly worse but her arm and leg are what are typical for her.  Note from earlier today did suggest some mild left weakness.  On exam: Pupils are equal round reactive, visual fields are full, extraocular movements are intact.  She has a significant left facial droop which does she holds taught  when she puffs air into her right cheek.  She has decreased sensation in the left face and arm, intact in the leg.  She does split midline to vibration on the forehead.  She has mild drift in the left arm and leg, 4+/5 strength, full strength on the right.   She was taken for a stat head CT which is negative.  Due to her symptoms being very close to her baseline per the patient and her husband, I did not feel that she was a candidate for IV TPA.  On route to CT, she had a seizure which was described by nursing staff as tonic-clonic.  I did not witness the seizure.  She was incontinent of urine.  After CT she had a second seizure and was given 1 mg of IV Ativan.  Additional dose of 500 mg IV Keppra now.  Ritta SlotMcNeill Caretha Rumbaugh, MD [facility] [phone]  If 7pm- 7am, please page neurology on call as listed in AMION.

## 2017-10-02 NOTE — Progress Notes (Signed)
PT Cancellation Note  Patient Details Name: Mariah Ibarra MRN: [id number] DOB: 06/08/1971   Cancelled Treatment:    Reason Eval/Treat Not Completed: Medical issues which prohibited therapy;Patient declined, no reason specified.  Patient very lethargic and moaning.  Significant other reports patient had seizures this am and needs to sleep.  Requested that we return tomorrow for PT evaluation.   [name] 10/02/2017, 6:20 PM Durenda HurtSusan H. Renaldo Fiddleravis, PT, [facility] Acute Rehab Services Pager [phone]

## 2017-10-02 NOTE — Progress Notes (Signed)
Subjective:  Was mildly hypotensive overnight.  Had an additional seizure overnight and coated stroke was called.  Her blood pressure is still on the low side but her pulse has been higher.  Objective:  Vital Signs in the last 24 hours: BP 99/71 (BP Location: Left Arm)   Pulse 68   Temp (!) 97.5 F (36.4 C) (Oral)   Resp 15   Ht 5\' 2"  (1.575 m)   Wt 65.4 kg (144 lb 2.9 oz)   LMP 11/01/2013   SpO2 100%   BMI 26.37 kg/m   Physical Exam: Pleasant white female appears anxious but in no acute distress Lungs:  Clear  Cardiac:  Regular rhythm, normal S1 and S2, no S3 Extremities:  No edema present  Intake/Output from previous day: 12/01 0701 - 12/02 0700 In: 1240 [P.O.:120; I.V.:1120] Out: -  Weight Filed Weights   09/29/17 0500 09/30/17 0500 09/30/17 1651  Weight: 71.6 kg (157 lb 13.6 oz) 71.6 kg (157 lb 13.6 oz) 65.4 kg (144 lb 2.9 oz)    Lab Results: Basic Metabolic Panel: Recent Labs    09/30/17 0604 10/02/17 0348  NA 136 135  K 3.5 3.6  CL 105 106  CO2 22 20*  GLUCOSE 113* 106*  BUN 5* 9  CREATININE 0.87 0.99    CBC: Recent Labs    09/30/17 0604 10/02/17 0348  WBC 14.8* 10.7*  HGB 13.6 13.3  HCT 41.1 39.6  MCV 94.5 94.5  PLT 300 334   Telemetry: Currently in normal sinus rhythm, mild sinus bradycardia overnight but nothing of significance and no other AV block or other issues.  Assessment/Plan:  1.  Recent recurrent seizures on seizure medicine 2.  Previous sinus bradycardia in the setting of syncope and seizures 3.  Recurrent seizures  Recommendations:  Suspect blood pressure issues and bradycardia more related to the seizures and seizure medication at this time.  Continue to monitor heart rate.     Darden PalmerW. Spencer Jeanetta Alonzo, Jr.  MD [facility] Cardiology  10/02/2017, 9:51 AM

## 2017-10-02 NOTE — Progress Notes (Signed)
Shift Note: Pt. hypotensive with BP 85/72, RRR, and normal O2 sat. @ 0202 this morning. Pt. responsive and asymptomatic. Primary provider notified, bolus NS ordered. After bolus initiated, RN noticed rapid change in neuro status. Pt. L facial droop, and complete L sided weakness, with complaint of dizziness. Primary provider and rapid response notified. Neuro paged, and code stroke initiated. Pt. Experienced multiple seizures while transported to CT, and again on return. Ativan x1 given. . MD and Rapid informed, additional orders received. Pt. had return of consciousness shortly after. See flowsheets for additional details.

## 2017-10-02 NOTE — Progress Notes (Signed)
PROGRESS NOTE    Mariah Ibarra [facility]  WGN:[id number] DOB: [birth year] DOA: 09/27/2017 PCP: Frederich Chick., MD    Brief Narrative:  46 year old female with a history of seizure disorder, hypertension, hyperlipidemia, anxiety/panic attacks presenting with syncope and nausea and vomiting.  The patient had numerous episodes of nausea and vomiting that began on September 21, 2017. patient quit taking her Keppra over the weekend, and she has not taken it for 3 days prior to admission.  To treat her nausea and dizziness,she took some over-the-counter Dramamine 4-5 times per day  starting on Thanksgiving day.  Since then, the patient had multiple falls and syncopal episodes.  According to her significant other, many of her syncopal episodes occur without any prodromal type symptoms as she is getting up to go to the bathroom.   Over the past week, she has had very poor oral intake, but denies any diarrhea. In addition to near syncopal spells she had 1 seizure, prior to admission, 2 seizures in the ED and another one yesterday at [facility]. In [facility] also noted to be bradycardic, but workup unremarkable  In the emergency department, the patient was afebrile hemodynamically stable.  She was noted to be bradycardic with heart rate 37.  She was given 2 doses of atropine. Patient was seen by cardiology at [facility], for her repeated episodes of bradycardia.  She required intermittent doses of atropine to increase her pulse rate from the low 30s to at least 50 bpm.  She was also seen by neurology and started on VImpat in addition to restarting her Keppra and gabapentin, she also underwent EEG which did not show any epileptiform activity.  Transferred to [facility] for cards and Neuro eval  Assessment & Plan:   Recurrent seizures -I suspect she had recurrent seizures due to recent nausea and vomiting and not being able to take seizure meds for 3days -MRI without acute findings, chronic changes noted -EEG without epileptiform  discharges -restarted Keppra/gabapentin, she was started on Vimpat at [facility] which we discontinued -Neurology following appreciate Dr.Arora's input -continue PRN Ativan for now  Syncope/Hypotension/Bradycardia -likely due to hypotension/bradycardia, suspect some dehydration from recent Nausea/vomtiing, diuretics and increased vagal tone contributing -cardiology following -Nausea and vomiting has resolved, continue IV fluids today -Stopped lisinopril and HCTZ -Am cortisol within normal range -ECHO unremarkable, TSH normal too -suspect polypharmacy contributing I have cut down her dose of Effexor and Celexa  Essential hypertension --now hypotensive, stopped ACE/HCTZ  Hematochezia/abdominal pain -resolved, CT unremarkable except constipation, hemoglobin is stable -no need for GI workup inpatient, recommend FU with GI outpatient  Hyponatremia -Resolved  Depression/Anxiety - Continue Celexa,Effexor, I have cut down doses due to polypharmacy and increased somnolence -some suspicion of psychological overlay in her symptoms as well will ask psych to evaluate on Monday  DVT prophylaxis: SCDs Code Status: Full code Family Communication: Husband is bedside Disposition Plan: keep in stepdown   Consultants:   Neurology  Cardiology   Procedures:   EEG on 09/27/2017: Results pending Echocardiogram on 09/27/2017:Normal LV wall thickness with LVEF 55-60%, indeterminate diastolic function. Trivial mitral regurgitation. Mild tricuspid regurgitation with PASP estimated 25 mmHg. -MRI brain: noted no acute findings  Antimicrobials:   None   Subjective: -overnight patient was somnolent, blood pressure was soft and had increased facial droop and a code stroke was called, en route to CT scan shadows seizure was given IV Ativan and extra dose of Keppra   Objective: Vitals:   10/02/17 0530 10/02/17 0545 10/02/17  0600 10/02/17 0834  BP: 129/89 (!) 144/89 (!) 135/94  99/71  Pulse: (!) 55 (!) 54 (!) 54 68  Resp: 16 17 13 15   Temp:    (!) 97.5 F (36.4 C)  TempSrc:    Oral  SpO2: 100% 100% 99% 100%  Weight:      Height:        Intake/Output Summary (Last 24 hours) at 10/02/2017 1228 Last data filed at 10/02/2017 0600 Gross per 24 hour  Intake 1120 ml  Output -  Net 1120 ml   Filed Weights   09/29/17 0500 09/30/17 0500 09/30/17 1651  Weight: 71.6 kg (157 lb 13.6 oz) 71.6 kg (157 lb 13.6 oz) 65.4 kg (144 lb 2.9 oz)    Examination:  General exam: somnolent, following Ativan and extra dose of Keppra this morning Respiratory system: Clear to auscultation. Respiratory effort normal. Cardiovascular system: S1 & S2 heard, bradycardic. No JVD, murmurs, rubs, gallops or clicks Gastrointestinal system: Abdomen is nondistended, soft and nontender. No organomegaly or masses felt. Normal bowel sounds heard. Central nervous system: somnolent, arousable, mild left facial droop, minimally decrease chance in the left upper extremity which per patient report is close to baseline Skin: Ecchymoses on left side of face from fall during seizure Psychiatry: Judgement and insight appear normal. Mood & affect appropriate.     Data Reviewed: I have personally reviewed following labs and imaging studies  CBC: Recent Labs  Lab 09/27/17 0233 09/27/17 0252 09/28/17 0539 09/30/17 0604 10/02/17 0348  WBC 9.5  --  10.0 14.8* 10.7*  NEUTROABS 4.3  --   --   --   --   HGB 14.4 15.0 13.1 13.6 13.3  HCT 43.1 44.0 40.6 41.1 39.6  MCV 95.1  --  97.6 94.5 94.5  PLT 360  --  278 300 334   Basic Metabolic Panel: Recent Labs  Lab 09/27/17 0233 09/27/17 0252 09/28/17 0539 09/30/17 0604 10/02/17 0348  NA 134* 136 139 136 135  K 4.5 4.2 3.9 3.5 3.6  CL 98* 97* 109 105 106  CO2 28  --  20* 22 20*  GLUCOSE 115* 110* 76 113* 106*  BUN 15 15 8  5* 9  CREATININE 1.10* 1.10* 0.98 0.87 0.99  CALCIUM 10.1  --  9.0 9.0 8.4*  MG 2.1  --   --  1.7  --    GFR: Estimated  Creatinine Clearance: 63 mL/min (by C-G formula based on SCr of 0.99 mg/dL). Liver Function Tests: Recent Labs  Lab 09/27/17 0233 09/30/17 0604  AST 26 21  ALT 16 17  ALKPHOS 34* 37*  BILITOT 0.7 0.5  PROT 7.9 6.6  ALBUMIN 4.0 3.4*   Recent Labs  Lab 09/27/17 0905  LIPASE 23   No results for input(s): AMMONIA in the last 168 hours. Coagulation Profile: Recent Labs  Lab 09/27/17 0233 10/02/17 0348  INR 0.93 0.95   Cardiac Enzymes: Recent Labs  Lab 09/27/17 0905 10/02/17 0348 10/02/17 0659  CKTOTAL 33*  --   --   TROPONINI  --  <0.03 <0.03   BNP (last 3 results) No results for input(s): PROBNP in the last 8760 hours. HbA1C: No results for input(s): HGBA1C in the last 72 hours. CBG: Recent Labs  Lab 10/01/17 1241 10/01/17 2057 10/02/17 0240 10/02/17 0618 10/02/17 0836  GLUCAP 110* 123* 95 122* 86   Lipid Profile: No results for input(s): CHOL, HDL, LDLCALC, TRIG, CHOLHDL, LDLDIRECT in the last 72 hours. Thyroid Function Tests: No results for  input(s): TSH, T4TOTAL, FREET4, T3FREE, THYROIDAB in the last 72 hours. Anemia Panel: No results for input(s): VITAMINB12, FOLATE, FERRITIN, TIBC, IRON, RETICCTPCT in the last 72 hours. Sepsis Labs: Recent Labs  Lab 09/27/17 [id number]  LATICACIDVEN 1.4    Recent Results (from the past 240 hour(s))  MRSA PCR Screening     Status: None   Collection Time: 09/27/17  6:25 AM  Result Value Ref Range Status   MRSA by PCR NEGATIVE NEGATIVE Final    Comment:        The GeneXpert MRSA Assay (FDA approved for NASAL specimens only), is one component of a comprehensive MRSA colonization surveillance program. It is not intended to diagnose MRSA infection nor to guide or monitor treatment for MRSA infections.   Culture, blood (Routine X 2) w Reflex to ID Panel     Status: None   Collection Time: 09/27/17 12:38 PM  Result Value Ref Range Status   Specimen Description BLOOD RIGHT HAND  Final   Special Requests   Final     BOTTLES DRAWN AEROBIC AND ANAEROBIC Blood Culture adequate volume   Culture NO GROWTH 5 DAYS  Final   Report Status 10/02/2017 FINAL  Final  Culture, blood (Routine X 2) w Reflex to ID Panel     Status: None   Collection Time: 09/27/17 12:50 PM  Result Value Ref Range Status   Specimen Description BLOOD RIGHT HAND  Final   Special Requests   Final    BOTTLES DRAWN AEROBIC ONLY Blood Culture results may not be optimal due to an inadequate volume of blood received in culture bottles   Culture NO GROWTH 5 DAYS  Final   Report Status 10/02/2017 FINAL  Final  Culture, Urine     Status: None   Collection Time: 09/27/17  7:30 PM  Result Value Ref Range Status   Specimen Description URINE, RANDOM  Final   Special Requests NONE  Final   Culture   Final    NO GROWTH Performed at [facility], [address] [place], [place] [id number]    Report Status 09/29/2017 FINAL  Final         Radiology Studies: Ct Head Code Stroke Wo Contrast  Result Date: 10/02/2017 CLINICAL DATA:  Code stroke. Initial evaluation for acute left-sided weakness. EXAM: CT HEAD WITHOUT CONTRAST TECHNIQUE: Contiguous axial images were obtained from the base of the skull through the vertex without intravenous contrast. COMPARISON:  Prior CT from 09/30/2017. FINDINGS: Brain: Cerebral volume within normal limits. No acute intracranial hemorrhage. No evidence for acute large vessel territory infarct. No mass lesion, midline shift or mass effect. No hydrocephalus. No extra-axial fluid collection. Small amount of encephalomalacia within the peripheral left cerebellar hemisphere noted, unchanged. Vascular: No hyperdense vessel. Skull: Scalp soft tissues demonstrate no acute abnormality. Prior left suboccipital craniotomy again noted. Sinuses/Orbits: Globes and orbital soft tissues within normal limits. Mild mucosal thickening noted within the sphenoid ethmoidal sinuses. Paranasal sinuses are otherwise clear. Mastoid air cells  are clear. Other: None. ASPECTS [facility]([place] Stroke Program Early CT Score) - Ganglionic level infarction (caudate, lentiform nuclei, internal capsule, insula, M1-M3 cortex): 7 - Supraganglionic infarction (M4-M6 cortex): 3 Total score (0-10 with 10 being normal): 10 IMPRESSION: 1. No acute intracranial infarct or other abnormality identified. 2. ASPECTS is 10. Critical Value/emergent results were called by telephone at the time of interpretation on 10/02/2017 at 3:51 am to Dr. Ritta SlotMCNEILL KIRKPATRICK , who verbally acknowledged these results. Electronically Signed   By: Sharlet SalinaBenjamin  Phill Myron M.D.   On: 10/02/2017 03:53        Scheduled Meds: . cholecalciferol  1,000 Units Oral QID  . [START ON 10/03/2017] citalopram  20 mg Oral Daily  . feeding supplement (ENSURE ENLIVE)  237 mL Oral TID BM  . gabapentin  400 mg Oral TID  . hydrocortisone  25 mg Rectal BID  . levETIRAcetam  1,500 mg Oral BID  . polyvinyl alcohol  1-2 drop Both Eyes Daily  . [START ON 10/03/2017] venlafaxine XR  75 mg Oral Q breakfast   Continuous Infusions: . sodium chloride 75 mL/hr at 10/01/17 2356  . sodium chloride 100 mL/hr at 10/02/17 0954     LOS: 3 days    Time spent: 35 minutes    Zannie Cove, MD [facility] Page via [email or web address], password [email or web address]  If 7PM-7AM, please contact night-coverage [email or web address] Password [email or web address] 10/02/2017, 12:28 PM

## 2017-10-02 NOTE — Significant Event (Signed)
Rapid Response Event Note  Overview: Time Called: 0310 Arrival Time: 0313 Event Type: Neurologic  Initial Focused Assessment: Hypotension and left side weakness   Interventions: Head CT, Ativan, and Keppra  Plan of Care (if not transferred):  Event Summary: Called to assist with care of patient with reported new onset left side facial weakness. LSW was at 02:15 per staff. Patient has been hypotensive with BP in the 80-90 range. Patient appeared glassy eyed on evaluation. Answering questions appropriately. Some left side facial weakness noted. Patient reports having sensory deficit on the left side. PERRL. Skin is warm and dry. Heart sounds regular. Due to left side deficits a code stroke was called. Dr. Amada JupiterKirkpatrick is at the bedside. Patient was taken for a CT of head and experienced a seizure while in CT. Incontinent of urine during tonic-clonic seizure. Airway is patent. Dr. Amada JupiterKirkpatrick was made aware of event and an extra dose of Keppra was ordered. Patient was given Ativan 1mg  by unit staff due to repeat seizure activity on return back to the unit. NIHSS was performed by unit staff. We will continue to assist with care as needed.   Beryl MeagerHarbison, [place]

## 2017-10-03 LAB — CBC
HCT: 37.4 % (ref 36.0–46.0)
HEMOGLOBIN: 12.2 g/dL (ref 12.0–15.0)
MCH: 31 pg (ref 26.0–34.0)
MCHC: 32.6 g/dL (ref 30.0–36.0)
MCV: 94.9 fL (ref 78.0–100.0)
PLATELETS: 318 10*3/uL (ref 150–400)
RBC: 3.94 MIL/uL (ref 3.87–5.11)
RDW: 13.5 % (ref 11.5–15.5)
WBC: 10.1 10*3/uL (ref 4.0–10.5)

## 2017-10-03 LAB — BASIC METABOLIC PANEL
Anion gap: 7 (ref 5–15)
BUN: 10 mg/dL (ref 6–20)
CHLORIDE: 110 mmol/L (ref 101–111)
CO2: 24 mmol/L (ref 22–32)
Calcium: 8.3 mg/dL — ABNORMAL LOW (ref 8.9–10.3)
Creatinine, Ser: 1.02 mg/dL — ABNORMAL HIGH (ref 0.44–1.00)
Glucose, Bld: 95 mg/dL (ref 65–99)
POTASSIUM: 3.5 mmol/L (ref 3.5–5.1)
SODIUM: 141 mmol/L (ref 135–145)

## 2017-10-03 LAB — GLUCOSE, CAPILLARY: GLUCOSE-CAPILLARY: 90 mg/dL (ref 65–99)

## 2017-10-03 NOTE — Care Management Note (Signed)
Case Management Note  Patient Details  Name: Mariah Ibarra MRN: [id number] Date of Birth: 05/05/1971  Subjective/Objective:  Left AMA.                  Action/Plan:   Expected Discharge Date:                  Expected Discharge Plan:  Home/Self Care  In-House Referral:     Discharge planning Services  CM Consult  Post Acute Care Choice:    Choice offered to:     DME Arranged:    DME Agency:     HH Arranged:    HH Agency:     Status of Service:  Completed, signed off  If discussed at [employer] Length of Stay Meetings, dates discussed:    Additional Comments:  Leone Havenaylor, Genesi Stefanko [place], RN 10/03/2017, 5:20 PM

## 2017-10-03 NOTE — Progress Notes (Signed)
Patient attempting to leave AMA. Patient educated on need to stay. Patient continues to state " I need to stay". Paged Dr. Jomarie LongsJoseph. Patient signed AMA form.

## 2017-10-03 NOTE — Progress Notes (Signed)
Pt c/o nausea and this time vomited 525 undigested food. Pt given Zoftan IV per MD PRN order

## 2017-10-03 NOTE — Progress Notes (Signed)
Pt c/o nausea and was given zofran IV per MD order.

## 2017-10-04 NOTE — Discharge Summary (Signed)
Physician Discharge Summary  Mariah SchlatterMelinda Ibarra [facility] Ibarra:[id number]:[id number] DOB: 08/16/1971 DOA: 09/27/2017  PCP: Mariah ChickFoster, Mariah M Jr., Mariah Ibarra  Admit date: 09/27/2017 Discharge date: 10/03/2017  Time spent: 45 minutes  Recommendations for Outpatient Follow-up:  LEFT AMA, unable to give any instructions or medications  Discharge Diagnoses:    Breakthrough seizures   Sinus bradycardia   Trigeminal neuralgia   Hypertension   Bradycardia   Hyperlipidemia   Hyponatremia   Seizure (HCC)   Anxiety   Volume depletion   Syncope and collapse   UTI (urinary tract infection)   Left lower quadrant pain   Seizure-like activity (HCC)   Symptomatic bradycardia   Filed Weights   09/29/17 0500 09/30/17 0500 09/30/17 1651  Weight: 71.6 kg (157 lb 13.6 oz) 71.6 kg (157 lb 13.6 oz) 65.4 kg (144 lb 2.9 oz)    History of present illness:  46 year old female with a history of seizure disorder, hypertension, hyperlipidemia, anxiety/panic attacks presenting with syncope and nausea and vomiting. The patient had numerous episodes of nausea and vomiting that began on September 21, 2017.patient quit taking her Keppra over the weekend, and she has not taken it for 3 days prior to admission. To treat her nausea and dizziness,she took some over-the-counter Dramamine 4-5 times per day starting on Thanksgiving day. Since then, the patient had multiple falls and syncopal episodes. According to her significant other, many of her syncopal episodes occur without any prodromal type symptoms as she is getting up to go to the bathroom.  Over the past week, she has had very poor oral intake, but denies any diarrhea. In addition to near syncopal spells she had 1 seizure, prior to admission, 2 seizures in the ED and another one yesterday [facility]. In [facility] also noted to be bradycardic, but workup unremarkable In the emergency department, the patient was afebrile hemodynamically stable. She was noted to be bradycardic with heart rate  37  Hospital Course:   Recurrent seizures -I suspect she had recurrent seizures due to recent nausea and vomiting and not being able to take seizure meds for 3days -MRI without acute findings, chronic changes noted -EEG without epileptiform discharges -restarted Keppra/gabapentin, she was [facility] which we discontinued -Neurology following appreciate Mariah Ibarra's input -continue PRN Ativan for now -LEFT AMA this morning  Syncope/Hypotension/Bradycardia -likely due to hypotension/bradycardia, suspect some dehydration from recent Nausea/vomtiing, diuretics and increased vagal tone contributing -cardiology following -Nausea and vomiting has resolved, continue IV fluids today -Stopped lisinopril and HCTZ -Am cortisol within normal range -ECHO unremarkable, TSH normal too -suspect polypharmacy contributing I have cut down her dose of Effexor and Celexa -LEFT AMA  Essential hypertension --BP low this admission, see above, stopped ACE/HCTZ  Hematochezia/abdominal pain -resolved, CT unremarkable except constipation, hemoglobin is stable -no need for GI workup inpatient, recommend FU with GI outpatient  Hyponatremia -Resolved  Depression/Anxiety - Continue Celexa,Effexor, I have cut down doses due to polypharmacy and increased somnolence -some suspicion of psychological overlay in her symptoms as well -Psych consulted, but she LEFT AMA before seen by Psych Mariah Ibarra     Consultations:  Neurology  Cardiology  Discharge Exam: Vitals:   10/03/17 0400 10/03/17 0819  BP: (!) 134/95 125/87  Pulse: 92 78  Resp: 14 (!) 22  Temp: 98.8 Ibarra (37.1 C) 98.1 Ibarra (36.7 C)  SpO2: 95% 96%    LEFT BEFORE I EXAMINED her  Discharge Instructions    Allergies as of 10/03/2017      Reactions  Baclofen Other (See Comments)   Seizure activity   Lorazepam Other (See Comments)   Doesn't tolerate      Medication List    ASK your doctor about these  medications   atorvastatin 80 MG tablet Commonly known as:  LIPITOR Take 80 mg by mouth daily.   cholecalciferol 1000 units tablet Commonly known as:  VITAMIN D Take 1,000 Units by mouth 4 (four) times daily.   citalopram 40 MG tablet Commonly known as:  CELEXA Take 40 mg by mouth daily.   diazepam 5 MG tablet Commonly known as:  VALIUM Take 5 mg by mouth every 12 (twelve) hours as needed for anxiety.   gabapentin 400 MG capsule Commonly known as:  NEURONTIN Take 400 mg by mouth 3 (three) times daily.   levETIRAcetam 750 MG tablet Commonly known as:  KEPPRA Take 1,500 mg by mouth 2 (two) times daily.   lidocaine 2 % jelly Commonly known as:  XYLOCAINE Apply 1 application topically as needed (pain).   lisinopril-hydrochlorothiazide 20-25 MG tablet Commonly known as:  PRINZIDE,ZESTORETIC Take 1 tablet by mouth daily.   magnesium hydroxide 400 MG/5ML suspension Commonly known as:  MILK OF MAGNESIA Take 15 mLs by mouth daily as needed for mild constipation.   magnesium oxide 400 MG tablet Commonly known as:  MAG-OX Take 400 mg by mouth daily.   mirtazapine 15 MG tablet Commonly known as:  REMERON Take 30 mg by mouth at bedtime.   pioglitazone 15 MG tablet Commonly known as:  ACTOS Take 15 mg by mouth daily.   promethazine 25 MG suppository Commonly known as:  PHENERGAN Place 25 mg rectally every 6 (six) hours as needed for nausea or vomiting.   REFRESH 1.4-0.6 % ophthalmic solution Generic drug:  polyvinyl alcohol-povidone Apply 1-2 drops to eye daily.   tiZANidine 4 MG tablet Commonly known as:  ZANAFLEX Take 4 mg by mouth 2 (two) times daily.   venlafaxine XR 150 MG 24 hr capsule Commonly known as:  EFFEXOR-XR Take 150 mg by mouth daily with breakfast.   zolpidem 10 MG tablet Commonly known as:  AMBIEN Take 10 mg by mouth at bedtime as needed for sleep.      Allergies  Allergen Reactions  . Baclofen Other (See Comments)    Seizure activity  .  Lorazepam Other (See Comments)    Doesn't tolerate      The results of significant diagnostics from this hospitalization (including imaging, microbiology, ancillary and laboratory) are listed below for reference.    Significant Diagnostic Studies: Ct Head Wo Contrast  Result Date: 09/30/2017 CLINICAL DATA:  Seizure falling face first on floor with facial trauma. EXAM: CT HEAD WITHOUT CONTRAST CT MAXILLOFACIAL WITHOUT CONTRAST TECHNIQUE: Multidetector CT imaging of the head and maxillofacial structures were performed using the standard protocol without intravenous contrast. Multiplanar CT image reconstructions of the maxillofacial structures were also generated. COMPARISON:  Head CT and brain MRI 3 days prior 09/27/2017 FINDINGS: CT HEAD FINDINGS Brain: Minimal left cerebellar encephalomalacia. No intracranial hemorrhage, mass effect, or midline shift. No hydrocephalus. The basilar cisterns are patent. No evidence of territorial infarct or acute ischemia. No extra-axial or intracranial fluid collection. Vascular: No hyperdense vessel or unexpected calcification. Skull: Left occipital craniotomy.  No fracture or focal lesion. Other: None. CT MAXILLOFACIAL FINDINGS Osseous: Zygomatic arches, nasal bone, and mandibles are intact. The temporomandibular joints are intact, mild chronic anterior subluxation of the mandible in the left, unchanged from 11/02/2016 CT. Orbits: Both orbits and globes are  intact.  No orbital fracture. Sinuses: Minimal mucosal thickening of right sphenoid sinus. Otherwise clear. Soft tissues: Negative. IMPRESSION: 1.  No acute intracranial abnormality. 2. No acute facial bone fracture. Electronically Signed   By: Rubye Oaks M.D.   On: 09/30/2017 05:12   Ct Head Wo Contrast  Result Date: 09/27/2017 CLINICAL DATA:  Seizure and fall EXAM: CT HEAD WITHOUT CONTRAST CT CERVICAL SPINE WITHOUT CONTRAST TECHNIQUE: Multidetector CT imaging of the head and cervical spine was performed  following the standard protocol without intravenous contrast. Multiplanar CT image reconstructions of the cervical spine were also generated. COMPARISON:  11/02/2016 FINDINGS: CT HEAD FINDINGS Brain: No acute territorial infarction, hemorrhage or intracranial mass is visualized. Small amount of encephalomalacia in the left cerebellum nonenlarged ventricles. Vascular: No hyperdense vessels.  No unexpected calcification Skull: No fracture. Left occipital craniectomy with hyperdense material and metallic plate as before. Sinuses/Orbits: Mild mucosal thickening in the ethmoid and sphenoid sinuses. No acute orbital abnormality Other: None CT CERVICAL SPINE FINDINGS Alignment: Straightening of the cervical spine. No subluxation. Facet alignment within normal limits Skull base and vertebrae: No acute fracture. No primary bone lesion or focal pathologic process. Soft tissues and spinal canal: No prevertebral fluid or swelling. No visible canal hematoma. Disc levels:  Mild degenerative changes at C5-C6 and C6-C7 Upper chest: Negative. Other: None IMPRESSION: 1. No CT evidence for acute intracranial abnormality. Stable left occipital postsurgical changes 2. Straightening of the cervical spine.  No acute fracture seen Electronically Signed   By: Jasmine Pang M.D.   On: 09/27/2017 03:51   Ct Cervical Spine Wo Contrast  Result Date: 09/27/2017 CLINICAL DATA:  Seizure and fall EXAM: CT HEAD WITHOUT CONTRAST CT CERVICAL SPINE WITHOUT CONTRAST TECHNIQUE: Multidetector CT imaging of the head and cervical spine was performed following the standard protocol without intravenous contrast. Multiplanar CT image reconstructions of the cervical spine were also generated. COMPARISON:  11/02/2016 FINDINGS: CT HEAD FINDINGS Brain: No acute territorial infarction, hemorrhage or intracranial mass is visualized. Small amount of encephalomalacia in the left cerebellum nonenlarged ventricles. Vascular: No hyperdense vessels.  No unexpected  calcification Skull: No fracture. Left occipital craniectomy with hyperdense material and metallic plate as before. Sinuses/Orbits: Mild mucosal thickening in the ethmoid and sphenoid sinuses. No acute orbital abnormality Other: None CT CERVICAL SPINE FINDINGS Alignment: Straightening of the cervical spine. No subluxation. Facet alignment within normal limits Skull base and vertebrae: No acute fracture. No primary bone lesion or focal pathologic process. Soft tissues and spinal canal: No prevertebral fluid or swelling. No visible canal hematoma. Disc levels:  Mild degenerative changes at C5-C6 and C6-C7 Upper chest: Negative. Other: None IMPRESSION: 1. No CT evidence for acute intracranial abnormality. Stable left occipital postsurgical changes 2. Straightening of the cervical spine.  No acute fracture seen Electronically Signed   By: Jasmine Pang M.D.   On: 09/27/2017 03:51   Mr Laqueta Jean WU Contrast  Result Date: 09/27/2017 CLINICAL DATA:  46 year old female status post seizure and fall. Headache. Personal history of trigeminal neuralgia decompression in 2009. EXAM: MRI HEAD WITHOUT AND WITH CONTRAST TECHNIQUE: Multiplanar, multiecho pulse sequences of the brain and surrounding structures were obtained without and with intravenous contrast. CONTRAST:  10mL MULTIHANCE GADOBENATE DIMEGLUMINE 529 MG/ML IV SOLN COMPARISON:  Head and cervical spine CT 0316 hours today. [facility] Brain MRI 03/27/2015. FINDINGS: Brain: Cerebral volume is stable and within normal limits. No restricted diffusion to suggest acute infarction. No midline shift, mass effect, evidence of mass lesion, ventriculomegaly,  extra-axial collection or acute intracranial hemorrhage. Cervicomedullary junction and pituitary are within normal limits. Sequelae of left suboccipital craniectomy as seen on the CT earlier today. A small wedge-shaped area of chronic abnormal T2 and FLAIR hyperintensity in the left lateral cerebellum is  unchanged. Elsewhere gray and white matter signal appears normal for age. Thin slice coronal T2 weighted images show bilateral hippocampal formations signal and morphology to be symmetric and within normal limits. No abnormal enhancement identified. No dural thickening. Vascular: Major intracranial vascular flow voids are stable since 2016 and within normal limits. Skull and upper cervical spine: Negative visualized cervical spine. Stable visualized osseous structures. Sinuses/Orbits: Normal orbits soft tissues. Mild posterior ethmoid and sphenoid sinus mucosal thickening is new since 2016. Other paranasal sinuses remain clear. Other: Mastoid air cells are clear. Visible internal auditory structures appear normal. Mild postoperative changes to the left posterior suboccipital scalp soft tissues. IMPRESSION: 1.  No acute intracranial abnormality. 2. Stable and normal MRI appearance of the brain aside from chronic left posterior fossa postoperative changes with mild left lateral cerebellar malacia. Electronically Signed   By: Odessa Fleming M.D.   On: 09/27/2017 12:45   Ct Abdomen Pelvis W Contrast  Result Date: 09/28/2017 CLINICAL DATA:  Pt c/o falling a lot today and states she has been having multiple mini seizures yesterday; pt also c/o right eye pain and pain to forehead after falling and striking her head on the floor; n/v since admission; diabetes;. Episode of hematochezia over the weekend. EXAM: CT ABDOMEN AND PELVIS WITH CONTRAST TECHNIQUE: Multidetector CT imaging of the abdomen and pelvis was performed using the standard protocol following bolus administration of intravenous contrast. CONTRAST:  ISOVUE-300 IOPAMIDOL (ISOVUE-300) INJECTION 61% COMPARISON:  CT 11/04/2016 FINDINGS: Lower chest: Lung bases are clear. Hepatobiliary: No focal hepatic lesion. No biliary duct dilatation. Gallbladder is normal. Common bile duct is normal. Pancreas: Pancreas is normal. No ductal dilatation. No pancreatic  inflammation. Spleen: Normal spleen Adrenals/urinary tract: Adrenal glands and kidneys are normal. Large simple cyst of the RIGHT kidney measuring 6.8 cm. The ureters and bladder normal. Bladder is mildly distended Stomach/Bowel: Stomach, small-bowel and terminal ileum are normal. Appendix is not identified. The ascending colon is distended by stool. Stool fills and distends the transverse colon. The descending colon is collapsed as well sigmoid colon rectum. No obstructing lesions identified. Vascular/Lymphatic: Abdominal aorta is normal caliber. There is no retroperitoneal or periportal lymphadenopathy. No pelvic lymphadenopathy. Reproductive: Post hysterectomy anatomy. Other: No free fluid. Musculoskeletal: No aggressive osseous lesion. IMPRESSION: 1. Moderate to large volume stool in the ascending and transverse colon and decompressed descending colon and sigmoid colon suggest obstructing lesion at the splenic flexure however no such lesion identified. Therefore favor findings to represent constipation (no lesion identified on CT 11/04/2016). In patient with hematochezia, consider colonoscopy for further evaluation. 2. Large RIGHT renal cyst. 3. Post hysterectomy. 4. Mild distention of the bladder. Electronically Signed   By: Genevive Bi M.D.   On: 09/28/2017 09:56   Ct Head Code Stroke Wo Contrast  Result Date: 10/02/2017 CLINICAL DATA:  Code stroke. Initial evaluation for acute left-sided weakness. EXAM: CT HEAD WITHOUT CONTRAST TECHNIQUE: Contiguous axial images were obtained from the base of the skull through the vertex without intravenous contrast. COMPARISON:  Prior CT from 09/30/2017. FINDINGS: Brain: Cerebral volume within normal limits. No acute intracranial hemorrhage. No evidence for acute large vessel territory infarct. No mass lesion, midline shift or mass effect. No hydrocephalus. No extra-axial fluid collection. Small amount of  encephalomalacia within the peripheral left cerebellar  hemisphere noted, unchanged. Vascular: No hyperdense vessel. Skull: Scalp soft tissues demonstrate no acute abnormality. Prior left suboccipital craniotomy again noted. Sinuses/Orbits: Globes and orbital soft tissues within normal limits. Mild mucosal thickening noted within the sphenoid ethmoidal sinuses. Paranasal sinuses are otherwise clear. Mastoid air cells are clear. Other: None. ASPECTS [facility] Stroke Program Early CT Score) - Ganglionic level infarction (caudate, lentiform nuclei, internal capsule, insula, M1-M3 cortex): 7 - Supraganglionic infarction (M4-M6 cortex): 3 Total score (0-10 with 10 being normal): 10 IMPRESSION: 1. No acute intracranial infarct or other abnormality identified. 2. ASPECTS is 10. Critical Value/emergent results were called by telephone at the time of interpretation on 10/02/2017 at 3:51 am to Dr. Ritta Slot , who verbally acknowledged these results. Electronically Signed   By: Rise Mu M.D.   On: 10/02/2017 03:53   Ct Maxillofacial Wo Contrast  Result Date: 09/30/2017 CLINICAL DATA:  Seizure falling face first on floor with facial trauma. EXAM: CT HEAD WITHOUT CONTRAST CT MAXILLOFACIAL WITHOUT CONTRAST TECHNIQUE: Multidetector CT imaging of the head and maxillofacial structures were performed using the standard protocol without intravenous contrast. Multiplanar CT image reconstructions of the maxillofacial structures were also generated. COMPARISON:  Head CT and brain MRI 3 days prior 09/27/2017 FINDINGS: CT HEAD FINDINGS Brain: Minimal left cerebellar encephalomalacia. No intracranial hemorrhage, mass effect, or midline shift. No hydrocephalus. The basilar cisterns are patent. No evidence of territorial infarct or acute ischemia. No extra-axial or intracranial fluid collection. Vascular: No hyperdense vessel or unexpected calcification. Skull: Left occipital craniotomy.  No fracture or focal lesion. Other: None. CT MAXILLOFACIAL FINDINGS Osseous:  Zygomatic arches, nasal bone, and mandibles are intact. The temporomandibular joints are intact, mild chronic anterior subluxation of the mandible in the left, unchanged from 11/02/2016 CT. Orbits: Both orbits and globes are intact.  No orbital fracture. Sinuses: Minimal mucosal thickening of right sphenoid sinus. Otherwise clear. Soft tissues: Negative. IMPRESSION: 1.  No acute intracranial abnormality. 2. No acute facial bone fracture. Electronically Signed   By: Rubye Oaks M.D.   On: 09/30/2017 05:12    Microbiology: Recent Results (from the past 240 hour(s))  MRSA PCR Screening     Status: None   Collection Time: 09/27/17  6:25 AM  Result Value Ref Range Status   MRSA by PCR NEGATIVE NEGATIVE Final    Comment:        The GeneXpert MRSA Assay (FDA approved for NASAL specimens only), is one component of a comprehensive MRSA colonization surveillance program. It is not intended to diagnose MRSA infection nor to guide or monitor treatment for MRSA infections.   Culture, blood (Routine X 2) w Reflex to ID Panel     Status: None   Collection Time: 09/27/17 12:38 PM  Result Value Ref Range Status   Specimen Description BLOOD RIGHT HAND  Final   Special Requests   Final    BOTTLES DRAWN AEROBIC AND ANAEROBIC Blood Culture adequate volume   Culture NO GROWTH 5 DAYS  Final   Report Status 10/02/2017 FINAL  Final  Culture, blood (Routine X 2) w Reflex to ID Panel     Status: None   Collection Time: 09/27/17 12:50 PM  Result Value Ref Range Status   Specimen Description BLOOD RIGHT HAND  Final   Special Requests   Final    BOTTLES DRAWN AEROBIC ONLY Blood Culture results may not be optimal due to an inadequate volume of blood received in culture bottles   Culture  NO GROWTH 5 DAYS  Final   Report Status 10/02/2017 FINAL  Final  Culture, Urine     Status: None   Collection Time: 09/27/17  7:30 PM  Result Value Ref Range Status   Specimen Description URINE, RANDOM  Final   Special  Requests NONE  Final   Culture   Final    NO GROWTH Performed at [facility], [address] [place], [place] [address]    Report Status 09/29/2017 FINAL  Final     Labs: Basic Metabolic Panel: Recent Labs  Lab 09/28/17 0539 09/30/17 0604 10/02/17 0348 10/03/17 0514  NA 139 136 135 141  K 3.9 3.5 3.6 3.5  CL 109 105 106 110  CO2 20* 22 20* 24  GLUCOSE 76 113* 106* 95  BUN 8 5* 9 10  CREATININE 0.98 0.87 0.99 1.02*  CALCIUM 9.0 9.0 8.4* 8.3*  MG  --  1.7  --   --    Liver Function Tests: Recent Labs  Lab 09/30/17 0604  AST 21  ALT 17  ALKPHOS 37*  BILITOT 0.5  PROT 6.6  ALBUMIN 3.4*   No results for input(s): LIPASE, AMYLASE in the last 168 hours. No results for input(s): AMMONIA in the last 168 hours. CBC: Recent Labs  Lab 09/28/17 0539 09/30/17 0604 10/02/17 0348 10/03/17 0514  WBC 10.0 14.8* 10.7* 10.1  HGB 13.1 13.6 13.3 12.2  HCT 40.6 41.1 39.6 37.4  MCV 97.6 94.5 94.5 94.9  PLT 278 300 334 318   Cardiac Enzymes: Recent Labs  Lab 10/02/17 0348 10/02/17 0659  TROPONINI <0.03 <0.03   BNP: BNP (last 3 results) No results for input(s): BNP in the last 8760 hours.  ProBNP (last 3 results) No results for input(s): PROBNP in the last 8760 hours.  CBG: Recent Labs  Lab 10/02/17 0836 10/02/17 1226 10/02/17 1717 10/02/17 2100 10/03/17 0818  GLUCAP 86 141* 92 99 90       Signed:  Zannie Cove Mariah Ibarra.  [facility] 10/04/2017, 5:44 PM

## 2017-10-07 ENCOUNTER — Telehealth (INDEPENDENT_AMBULATORY_CARE_PROVIDER_SITE_OTHER): Payer: Self-pay | Admitting: *Deleted

## 2017-10-07 NOTE — Telephone Encounter (Signed)
Patient left message -- states you told her to call office to schedule endoscopy -- please advise what I need to schedule

## 2017-10-16 NOTE — Telephone Encounter (Signed)
She needs to be scheduled for colonoscopy because of rectal bleeding. We saw her while she was hospitalized recently. She may need repeat EGD with duodenal biopsy but will determine on the day of the procedure.  She had a EGD earlier this year.

## 2017-10-17 NOTE — Telephone Encounter (Signed)
Patient has OV 10/18/17 with NUR

## 2017-10-18 ENCOUNTER — Encounter (INDEPENDENT_AMBULATORY_CARE_PROVIDER_SITE_OTHER): Payer: Self-pay

## 2017-10-18 ENCOUNTER — Encounter (INDEPENDENT_AMBULATORY_CARE_PROVIDER_SITE_OTHER): Payer: Self-pay | Admitting: Internal Medicine

## 2017-10-18 ENCOUNTER — Ambulatory Visit (INDEPENDENT_AMBULATORY_CARE_PROVIDER_SITE_OTHER): Payer: Medicare Other | Admitting: Internal Medicine

## 2017-10-18 VITALS — BP 94/80 | HR 70 | Temp 98.6°F | Resp 18 | Ht 62.0 in | Wt 142.0 lb

## 2017-10-18 DIAGNOSIS — K625 Hemorrhage of anus and rectum: Secondary | ICD-10-CM

## 2017-10-18 DIAGNOSIS — R112 Nausea with vomiting, unspecified: Secondary | ICD-10-CM | POA: Diagnosis not present

## 2017-10-18 DIAGNOSIS — R634 Abnormal weight loss: Secondary | ICD-10-CM | POA: Diagnosis not present

## 2017-10-18 DIAGNOSIS — K582 Mixed irritable bowel syndrome: Secondary | ICD-10-CM

## 2017-10-18 MED ORDER — BISACODYL 10 MG RE SUPP: 10.0000 mg | Freq: Every day | RECTAL | 0 refills | Status: AC | PRN

## 2017-10-18 NOTE — Patient Instructions (Signed)
Physician will call with results of blood test when completed. Colonoscopy to be scheduled.

## 2017-10-18 NOTE — Progress Notes (Signed)
Presenting complaint;  Follow-up for nausea vomiting left lower quadrant abdominal pain irregular bowel movements rectal bleeding.  Database and subjective:  Patient is 46 year old Caucasian female who was seen in the office in January this year for few months history of nausea and vomiting unresponsive therapy.  She reported 20 pound weight loss in 3 months.  Abdominopelvic CT revealed large stomach otherwise unremarkable.  EGD revealed antral scarring and gastritis and H. pylori serology was negative.  Duodenal mucosa was normal.  She return for solid-phase gastric emptying study and it was within normal limits. Patient was hospitalized about 3 weeks ago for falling episodes.  She was noted to be bradycardic and admitted to ICU and monitored.  She was seen in consultation by us for hematochezia.  We recommended evaluation once acute issues have been resolved.  Patient was [facility] and decided to leave AMA about 2 weeks ago.  She remains with nausea and vomiting.  She has daily nausea and vomits every other day if not daily.  She says she is not able to eat.  She states she has lost 100 pounds in over a year.  Review of her available weighted records revealed that she weighed 190 pounds on 10/12/2016 and she weighed 182 pounds on her office visit on 11/28/2016.  She had abdominopelvic CT on 09/27/2017 and no abnormality noted involving her GI tract.  She has multiple other complaints.  She complains of LLQ abdominal pain diarrhea and/or constipation.  She is also passing small to moderate amount of blood per rectum.  She also gives a history of black stools but she has been using Pepto-Bismol.  She denies hematemesis.  She has daily headache.  She has history of seizure disorder.  She has undergone multiple interventions.  She has had one seizure episode since she was discharged 2 weeks ago.  She remains under a lot of stress.  She says she has PTSD relating to her duty in [place].  [facility] and she is not sure who will be her new PCP.  [facility] disorder.   Current Medications: Outpatient Encounter Medications as of 10/18/2017  Medication Sig  . atorvastatin (LIPITOR) 80 MG tablet Take 80 mg by mouth daily.  . cholecalciferol (VITAMIN D) 1000 units tablet Take 1,000 Units by mouth 4 (four) times daily.  . citalopram (CELEXA) 40 MG tablet Take 40 mg by mouth daily.  . diazepam (VALIUM) 5 MG tablet Take 5 mg by mouth every 12 (twelve) hours as needed for anxiety.  . gabapentin (NEURONTIN) 400 MG capsule Take 400 mg by mouth 3 (three) times daily.  Marland Kitchen. levETIRAcetam (KEPPRA) 750 MG tablet Take 1,500 mg by mouth 2 (two) times daily.  Marland Kitchen. lidocaine (XYLOCAINE) 2 % jelly Apply 1 application topically as needed (pain).   Marland Kitchen. lisinopril-hydrochlorothiazide (PRINZIDE,ZESTORETIC) 20-25 MG tablet Take 1 tablet by mouth daily.  . magnesium hydroxide (MILK OF MAGNESIA) 400 MG/5ML suspension Take 15 mLs by mouth daily as needed for mild constipation.  . magnesium oxide (MAG-OX) 400 MG tablet Take 400 mg by mouth daily.  . mirtazapine (REMERON) 15 MG tablet Take 30 mg by mouth at bedtime.   . pioglitazone (ACTOS) 15 MG tablet Take 15 mg by mouth daily.  . polyvinyl alcohol-povidone (REFRESH) 1.4-0.6 % ophthalmic solution Apply 1-2 drops to eye daily.   . promethazine (PHENERGAN) 25 MG suppository Place 25 mg rectally every  6 (six) hours as needed for nausea or vomiting.  Marland Kitchen. tiZANidine (ZANAFLEX) 4 MG tablet Take 4 mg by mouth 2 (two) times daily.   Marland Kitchen. zolpidem (AMBIEN) 10 MG tablet Take 10 mg by mouth at bedtime as needed for sleep.  . [DISCONTINUED] venlafaxine XR (EFFEXOR-XR) 150 MG 24 hr capsule Take 150 mg by mouth daily with breakfast.   No facility-administered encounter medications on file as of 10/18/2017.      Objective: Blood pressure 94/80, pulse 70, temperature 98.6 F (37 C), temperature source  Oral, resp. rate 18, height 5\' 2"  (1.575 m), weight 142 lb (64.4 kg), last menstrual period 11/01/2013. Patient is alert and in no acute distress. Conjunctiva is pink. Sclera is nonicteric Oropharyngeal mucosa is normal. No neck masses or thyromegaly noted. Cardiac exam with regular rhythm normal S1 and S2. No murmur or gallop noted. Lungs are clear to auscultation. Abdomen is symmetrical.  Bowel sounds are normal.  On palpation abdomen is soft.  She has mild generalized tenderness which is slightly more pronounced in epigastric region.  No organomegaly or masses. No LE edema or clubbing noted.  Labs/studies Results: Lab data from 10/03/2017 WBC 10.1, H&H 12.2 and 37.4 and platelet count 318K.    Assessment:  #1.  Rectal bleeding.  Apparently she has had this bleeding off and on for at least 3 years.  Recent H&H was normal.  Suspect bleeding secondary to hemorrhoids given history of IBS diarrhea and constipation.  Need to rule out other causes.  Doubt inflammatory bowel disease.  #2.  Chronic nausea and vomiting.  She had a EGD in March 2018 revealing gastritis and scarring indicative of healed ulcers.  Biopsy was negative for H. pylori infection.  Subsequently she had solid-phase gastric emptying study and was normal.  She has undergone multiple abdominal pelvic CT is in the last 3 years and no bowel wall thickening or changes noted to suggest IBD.  I suspect nausea vomiting is secondary to chronic/daily headache.  #3.  Weight loss.  By patient's account she has lost over 100 pounds in over a year.  According to her records she has lost 40 pounds in the last 11 months.  Weight loss would appear to be due to nausea and vomiting and poor intake.  Recent cortisol level was normal.  This needs to be repeated just in case.  #4.  Irritable bowel syndrome.  She has diarrhea and/or constipation and she also has LLQ abdominal pain.  If she is able to prevent constipation she may not have diarrhea.   Will review her records and consider screening for celiac disease unless she has had it done.   Plan:  Fasting cortisol level. Diagnostic colonoscopy under monitored anesthesia care. Patient advised to use Dulcolax suppository daily or every other day so that she can have a bowel movement on regular basis. Patient advised to follow-up with her neurologist regarding chronic headache. Office visit in 3 months.

## 2017-10-19 ENCOUNTER — Other Ambulatory Visit (INDEPENDENT_AMBULATORY_CARE_PROVIDER_SITE_OTHER): Payer: Self-pay | Admitting: Internal Medicine

## 2017-10-19 ENCOUNTER — Encounter (INDEPENDENT_AMBULATORY_CARE_PROVIDER_SITE_OTHER): Payer: Self-pay | Admitting: *Deleted

## 2017-10-19 ENCOUNTER — Telehealth (INDEPENDENT_AMBULATORY_CARE_PROVIDER_SITE_OTHER): Payer: Self-pay | Admitting: *Deleted

## 2017-10-19 DIAGNOSIS — R634 Abnormal weight loss: Secondary | ICD-10-CM

## 2017-10-19 DIAGNOSIS — K625 Hemorrhage of anus and rectum: Secondary | ICD-10-CM

## 2017-10-19 DIAGNOSIS — R112 Nausea with vomiting, unspecified: Secondary | ICD-10-CM | POA: Insufficient documentation

## 2017-10-19 MED ORDER — PEG 3350-KCL-NA BICARB-NACL 420 G PO SOLR: 4000.0000 mL | Freq: Once | ORAL | 0 refills | Status: AC

## 2017-10-19 NOTE — Telephone Encounter (Signed)
Patient needs trilyte 

## 2017-10-19 NOTE — Patient Instructions (Signed)
[facility]il[facility] Marcano [facility]  10/19/2017     @PREFPERIOPPHARMACY @   Your procedure is scheduled on 10/21/17.  Report to [facility] at 08:30 A.M.  Call this number if you have problems the morning of surgery:  [phone]   Remember:  Do not eat food or drink liquids after midnight.  Take these medicines the morning of surgery with A SIP OF WATER lipitor, celexa, valium, gabapentin, keppra, refresh eye drops, zanaflex   Do not wear jewelry, make-up or nail polish.  Do not wear lotions, powders, or perfumes, or deodorant.  Do not shave 48 hours prior to surgery.  Men may shave face and neck.  Do not bring valuables to the hospital.  [facility] is not responsible for any belongings or valuables.  Contacts, dentures or bridgework may not be worn into surgery.  Leave your suitcase in the car.  After surgery it may be brought to your room.  For patients admitted to the hospital, discharge time will be determined by your treatment team.  Patients discharged the day of surgery will not be allowed to drive home.   Name and phone number of your driver:    Special instructions:    Please read over the following fact sheets that you were given. Anesthesia Post-op Instructions    Colonoscopy, Adult A colonoscopy is an exam to look at the entire large intestine. During the exam, a lubricated, bendable tube is inserted into the anus and then passed into the rectum, colon, and other parts of the large intestine. A colonoscopy is often done as a part of normal colorectal screening or in response to certain symptoms, such as anemia, persistent diarrhea, abdominal pain, and blood in the stool. The exam can help screen for and diagnose medical problems, including:  Tumors.  Polyps.  Inflammation.  Areas of bleeding.  Tell a health care provider about:  Any allergies you have.  All medicines you are taking, including vitamins, herbs, eye drops, creams, and over-the-counter medicines.  Any  problems you or family members have had with anesthetic medicines.  Any blood disorders you have.  Any surgeries you have had.  Any medical conditions you have.  Any problems you have had passing stool. What are the risks? Generally, this is a safe procedure. However, problems may occur, including:  Bleeding.  A tear in the intestine.  A reaction to medicines given during the exam.  Infection (rare).  What happens before the procedure? Eating and drinking restrictions Follow instructions from your health care provider about eating and drinking, which may include:  A few days before the procedure - follow a low-fiber diet. Avoid nuts, seeds, dried fruit, raw fruits, and vegetables.  1-3 days before the procedure - follow a clear liquid diet. Drink only clear liquids, such as clear broth or bouillon, black coffee or tea, clear juice, clear soft drinks or sports drinks, gelatin dessert, and popsicles. Avoid any liquids that contain red or purple dye.  On the day of the procedure - do not eat or drink anything during the 2 hours before the procedure, or within the time period that your health care provider recommends.  Bowel prep If you were prescribed an oral bowel prep to clean out your colon:  Take it as told by your health care provider. Starting the day before your procedure, you will need to drink a large amount of medicated liquid. The liquid will cause you to have multiple loose stools until your stool is almost clear or  light green.  If your skin or anus gets irritated from diarrhea, you may use these to relieve the irritation: ? Medicated wipes, such as adult wet wipes with aloe and vitamin E. ? A skin soothing-product like petroleum jelly.  If you vomit while drinking the bowel prep, take a break for up to 60 minutes and then begin the bowel prep again. If vomiting continues and you cannot take the bowel prep without vomiting, call your health care provider.  General  instructions  Ask your health care provider about changing or stopping your regular medicines. This is especially important if you are taking diabetes medicines or blood thinners.  Plan to have someone take you home from the hospital or clinic. What happens during the procedure?  An IV tube may be inserted into one of your veins.  You will be given medicine to help you relax (sedative).  To reduce your risk of infection: ? Your health care team will wash or sanitize their hands. ? Your anal area will be washed with soap.  You will be asked to lie on your side with your knees bent.  Your health care provider will lubricate a long, thin, flexible tube. The tube will have a camera and a light on the end.  The tube will be inserted into your anus.  The tube will be gently eased through your rectum and colon.  Air will be delivered into your colon to keep it open. You may feel some pressure or cramping.  The camera will be used to take images during the procedure.  A small tissue sample may be removed from your body to be examined under a microscope (biopsy). If any potential problems are found, the tissue will be sent to a lab for testing.  If small polyps are found, your health care provider may remove them and have them checked for cancer cells.  The tube that was inserted into your anus will be slowly removed. The procedure may vary among health care providers and hospitals. What happens after the procedure?  Your blood pressure, heart rate, breathing rate, and blood oxygen level will be monitored until the medicines you were given have worn off.  Do not drive for 24 hours after the exam.  You may have a small amount of blood in your stool.  You may pass gas and have mild abdominal cramping or bloating due to the air that was used to inflate your colon during the exam.  It is up to you to get the results of your procedure. Ask your health care provider, or the department  performing the procedure, when your results will be ready. This information is not intended to replace advice given to you by your health care provider. Make sure you discuss any questions you have with your health care provider. Document Released: 10/15/2000 Document Revised: 08/18/2016 Document Reviewed: 12/30/2015 Elsevier Interactive Patient Education  2018 Elsevier Inc.    Monitored Anesthesia Care Anesthesia is a term that refers to techniques, procedures, and medicines that help a person stay safe and comfortable during a medical procedure. Monitored anesthesia care, or sedation, is one type of anesthesia. Your anesthesia specialist may recommend sedation if you will be having a procedure that does not require you to be unconscious, such as:  Cataract surgery.  A dental procedure.  A biopsy.  A colonoscopy.  During the procedure, you may receive a medicine to help you relax (sedative). There are three levels of sedation:  Mild sedation. At this  level, you may feel awake and relaxed. You will be able to follow directions.  Moderate sedation. At this level, you will be sleepy. You may not remember the procedure.  Deep sedation. At this level, you will be asleep. You will not remember the procedure.  The more medicine you are given, the deeper your level of sedation will be. Depending on how you respond to the procedure, the anesthesia specialist may change your level of sedation or the type of anesthesia to fit your needs. An anesthesia specialist will monitor you closely during the procedure. Let your health care provider know about:  Any allergies you have.  All medicines you are taking, including vitamins, herbs, eye drops, creams, and over-the-counter medicines.  Any use of steroids (by mouth or as a cream).  Any problems you or family members have had with sedatives and anesthetic medicines.  Any blood disorders you have.  Any surgeries you have had.  Any medical  conditions you have, such as sleep apnea.  Whether you are pregnant or may be pregnant.  Any use of cigarettes, alcohol, or street drugs. What are the risks? Generally, this is a safe procedure. However, problems may occur, including:  Getting too much medicine (oversedation).  Nausea.  Allergic reaction to medicines.  Trouble breathing. If this happens, a breathing tube may be used to help with breathing. It will be removed when you are awake and breathing on your own.  Heart trouble.  Lung trouble.  Before the procedure Staying hydrated Follow instructions from your health care provider about hydration, which may include:  Up to 2 hours before the procedure - you may continue to drink clear liquids, such as water, clear fruit juice, black coffee, and plain tea.  Eating and drinking restrictions Follow instructions from your health care provider about eating and drinking, which may include:  8 hours before the procedure - stop eating heavy meals or foods such as meat, fried foods, or fatty foods.  6 hours before the procedure - stop eating light meals or foods, such as toast or cereal.  6 hours before the procedure - stop drinking milk or drinks that contain milk.  2 hours before the procedure - stop drinking clear liquids.  Medicines Ask your health care provider about:  Changing or stopping your regular medicines. This is especially important if you are taking diabetes medicines or blood thinners.  Taking medicines such as aspirin and ibuprofen. These medicines can thin your blood. Do not take these medicines before your procedure if your health care provider instructs you not to.  Tests and exams  You will have a physical exam.  You may have blood tests done to show: ? How well your kidneys and liver are working. ? How well your blood can clot.  General instructions  Plan to have someone take you home from the hospital or clinic.  If you will be going home  right after the procedure, plan to have someone with you for 24 hours.  What happens during the procedure?  Your blood pressure, heart rate, breathing, level of pain and overall condition will be monitored.  An IV tube will be inserted into one of your veins.  Your anesthesia specialist will give you medicines as needed to keep you comfortable during the procedure. This may mean changing the level of sedation.  The procedure will be performed. After the procedure  Your blood pressure, heart rate, breathing rate, and blood oxygen level will be monitored until the medicines  you were given have worn off.  Do not drive for 24 hours if you received a sedative.  You may: ? Feel sleepy, clumsy, or nauseous. ? Feel forgetful about what happened after the procedure. ? Have a sore throat if you had a breathing tube during the procedure. ? Vomit. This information is not intended to replace advice given to you by your health care provider. Make sure you discuss any questions you have with your health care provider. Document Released: 07/14/2005 Document Revised: 03/26/2016 Document Reviewed: 02/08/2016 Elsevier Interactive Patient Education  [employer]2018 Elsevier Inc.

## 2017-10-20 ENCOUNTER — Encounter (HOSPITAL_COMMUNITY)
Admission: RE | Admit: 2017-10-20 | Discharge: 2017-10-20 | Disposition: A | Discharge status: Home / Self Care | Payer: Medicare Other | Source: Ambulatory Visit | Attending: Internal Medicine | Admitting: Internal Medicine

## 2017-10-20 LAB — CORTISOL: CORTISOL PLASMA: 1.7 ug/dL — AB

## 2017-10-20 NOTE — Progress Notes (Signed)
Dr Jayme CloudGonzalez notified and reviewed medical record of recent hospitalization for seizures and bradycardia.  Instructed patient to take her morning medications.

## 2017-10-21 ENCOUNTER — Encounter (HOSPITAL_COMMUNITY)
Admission: RE | Disposition: A | Discharge status: Home / Self Care | Payer: Self-pay | Source: Ambulatory Visit | Attending: Internal Medicine

## 2017-10-21 ENCOUNTER — Encounter (HOSPITAL_COMMUNITY): Payer: Self-pay | Admitting: *Deleted

## 2017-10-21 ENCOUNTER — Ambulatory Visit (HOSPITAL_COMMUNITY): Payer: Medicare Other | Admitting: Anesthesiology

## 2017-10-21 ENCOUNTER — Ambulatory Visit (HOSPITAL_COMMUNITY)
Admission: RE | Admit: 2017-10-21 | Discharge: 2017-10-21 | Disposition: A | Discharge status: Home / Self Care | Payer: Medicare Other | Source: Ambulatory Visit | Attending: Internal Medicine | Admitting: Internal Medicine

## 2017-10-21 DIAGNOSIS — K625 Hemorrhage of anus and rectum: Secondary | ICD-10-CM | POA: Diagnosis present

## 2017-10-21 DIAGNOSIS — E7800 Pure hypercholesterolemia, unspecified: Secondary | ICD-10-CM | POA: Diagnosis not present

## 2017-10-21 DIAGNOSIS — Z7984 Long term (current) use of oral hypoglycemic drugs: Secondary | ICD-10-CM | POA: Insufficient documentation

## 2017-10-21 DIAGNOSIS — K648 Other hemorrhoids: Secondary | ICD-10-CM | POA: Diagnosis not present

## 2017-10-21 DIAGNOSIS — F419 Anxiety disorder, unspecified: Secondary | ICD-10-CM | POA: Diagnosis not present

## 2017-10-21 DIAGNOSIS — R112 Nausea with vomiting, unspecified: Secondary | ICD-10-CM | POA: Diagnosis not present

## 2017-10-21 DIAGNOSIS — E119 Type 2 diabetes mellitus without complications: Secondary | ICD-10-CM | POA: Insufficient documentation

## 2017-10-21 DIAGNOSIS — R569 Unspecified convulsions: Secondary | ICD-10-CM | POA: Insufficient documentation

## 2017-10-21 DIAGNOSIS — K921 Melena: Secondary | ICD-10-CM | POA: Diagnosis not present

## 2017-10-21 DIAGNOSIS — Z8673 Personal history of transient ischemic attack (TIA), and cerebral infarction without residual deficits: Secondary | ICD-10-CM | POA: Diagnosis not present

## 2017-10-21 DIAGNOSIS — Z800 Family history of malignant neoplasm of digestive organs: Secondary | ICD-10-CM | POA: Diagnosis not present

## 2017-10-21 DIAGNOSIS — Z79899 Other long term (current) drug therapy: Secondary | ICD-10-CM | POA: Diagnosis not present

## 2017-10-21 DIAGNOSIS — R1032 Left lower quadrant pain: Secondary | ICD-10-CM | POA: Insufficient documentation

## 2017-10-21 DIAGNOSIS — K644 Residual hemorrhoidal skin tags: Secondary | ICD-10-CM | POA: Diagnosis not present

## 2017-10-21 DIAGNOSIS — R634 Abnormal weight loss: Secondary | ICD-10-CM | POA: Diagnosis not present

## 2017-10-21 DIAGNOSIS — K6389 Other specified diseases of intestine: Secondary | ICD-10-CM | POA: Diagnosis not present

## 2017-10-21 DIAGNOSIS — I10 Essential (primary) hypertension: Secondary | ICD-10-CM | POA: Insufficient documentation

## 2017-10-21 HISTORY — PX: COLONOSCOPY WITH PROPOFOL: SHX5780

## 2017-10-21 LAB — GLUCOSE, CAPILLARY
GLUCOSE-CAPILLARY: 62 mg/dL — AB (ref 65–99)
GLUCOSE-CAPILLARY: 97 mg/dL (ref 65–99)
Glucose-Capillary: 162 mg/dL — ABNORMAL HIGH (ref 65–99)

## 2017-10-21 SURGERY — COLONOSCOPY WITH PROPOFOL
Anesthesia: Monitor Anesthesia Care

## 2017-10-21 MED ORDER — ONDANSETRON HCL 4 MG/2ML IJ SOLN
INTRAMUSCULAR | Status: AC
  Filled 2017-10-21: qty 2

## 2017-10-21 MED ORDER — PROPOFOL 500 MG/50ML IV EMUL
INTRAVENOUS | Status: DC | PRN
  Administered 2017-10-21: 150 ug/kg/min via INTRAVENOUS
  Administered 2017-10-21: 10:00:00 via INTRAVENOUS

## 2017-10-21 MED ORDER — FENTANYL CITRATE (PF) 100 MCG/2ML IJ SOLN
25.0000 ug | Freq: Once | INTRAMUSCULAR | Status: AC
  Administered 2017-10-21: 25 ug via INTRAVENOUS

## 2017-10-21 MED ORDER — CHLORHEXIDINE GLUCONATE CLOTH 2 % EX PADS: 6.0000 | MEDICATED_PAD | Freq: Once | CUTANEOUS | Status: DC

## 2017-10-21 MED ORDER — MIDAZOLAM HCL 2 MG/2ML IJ SOLN
2.0000 mg | INTRAMUSCULAR | Status: AC
  Administered 2017-10-21 (×3): 2 mg via INTRAVENOUS
  Filled 2017-10-21 (×3): qty 2

## 2017-10-21 MED ORDER — PROPOFOL 10 MG/ML IV BOLUS
INTRAVENOUS | Status: DC | PRN
  Administered 2017-10-21 (×3): 10 mg via INTRAVENOUS

## 2017-10-21 MED ORDER — ONDANSETRON HCL 4 MG/2ML IJ SOLN
4.0000 mg | Freq: Once | INTRAMUSCULAR | Status: AC
  Administered 2017-10-21: 4 mg via INTRAVENOUS

## 2017-10-21 MED ORDER — DEXTROSE 50 % IV SOLN
12.5000 g | Freq: Once | INTRAVENOUS | Status: AC
  Administered 2017-10-21: 12.5 g via INTRAVENOUS

## 2017-10-21 MED ORDER — PROPOFOL 10 MG/ML IV BOLUS
INTRAVENOUS | Status: AC
  Filled 2017-10-21: qty 40

## 2017-10-21 MED ORDER — LACTATED RINGERS IV SOLN
INTRAVENOUS | Status: DC
  Administered 2017-10-21: 09:00:00 via INTRAVENOUS

## 2017-10-21 MED ORDER — DEXTROSE 50 % IV SOLN
INTRAVENOUS | Status: AC
  Filled 2017-10-21: qty 50

## 2017-10-21 MED ORDER — FENTANYL CITRATE (PF) 100 MCG/2ML IJ SOLN
INTRAMUSCULAR | Status: AC
  Filled 2017-10-21: qty 2

## 2017-10-21 NOTE — Discharge Instructions (Signed)
Resume usual medications and diet. No driving for 24 hours. Physician will call with results of blood test(celiac antibody panel). ACTH stimulation test to be scheduled next week.  office will call. Colonoscopy, Adult, Care After This sheet gives you information about how to care for yourself after your procedure. Your doctor may also give you more specific instructions. If you have problems or questions, call your doctor. Follow these instructions at home: General instructions   For the first 24 hours after the procedure: ? Do not drive or use machinery. ? Do not sign important documents. ? Do not drink alcohol. ? Do your daily activities more slowly than normal. ? Eat foods that are soft and easy to digest. ? Rest often.  Take over-the-counter or prescription medicines only as told by your doctor.  It is up to you to get the results of your procedure. Ask your doctor, or the department performing the procedure, when your results will be ready. To help cramping and bloating:  Try walking around.  Put heat on your belly (abdomen) as told by your doctor. Use a heat source that your doctor recommends, such as a moist heat pack or a heating pad. ? Put a towel between your skin and the heat source. ? Leave the heat on for 20-30 minutes. ? Remove the heat if your skin turns bright red. This is especially important if you cannot feel pain, heat, or cold. You can get burned. Eating and drinking  Drink enough fluid to keep your pee (urine) clear or pale yellow.  Return to your normal diet as told by your doctor. Avoid heavy or fried foods that are hard to digest.  Avoid drinking alcohol for as long as told by your doctor. Contact a doctor if:  You have blood in your poop (stool) 2-3 days after the procedure. Get help right away if:  You have more than a small amount of blood in your poop.  You see large clumps of tissue (blood clots) in your poop.  Your belly is swollen.  You  feel sick to your stomach (nauseous).  You throw up (vomit).  You have a fever.  You have belly pain that gets worse, and medicine does not help your pain. This information is not intended to replace advice given to you by your health care provider. Make sure you discuss any questions you have with your health care provider. Document Released: 11/20/2010 Document Revised: 07/12/2016 Document Reviewed: 07/12/2016 Elsevier Interactive Patient Education  2017 [employer]

## 2017-10-21 NOTE — Anesthesia Preprocedure Evaluation (Signed)
Anesthesia Evaluation  Patient identified by MRN, date of birth, ID band Patient awake    Reviewed: Allergy & Precautions, NPO status , Patient's Chart, lab work & pertinent test results  History of Anesthesia Complications (+) PONV and history of anesthetic complications  Airway Mallampati: II  TM Distance: >3 FB     Dental  (+) Teeth Intact   Pulmonary    breath sounds clear to auscultation       Cardiovascular hypertension,  Rhythm:Regular Rate:Normal     Neuro/Psych Seizures -,  PSYCHIATRIC DISORDERS Anxiety Depression Trigeminal neuralgia - Gamma Knife and surgical ablation - DREZ procedure. SIADH (syndrome of inappropriate ADH production) short-term memory loss  Neuromuscular disease CVA    GI/Hepatic negative GI ROS,   Endo/Other  diabetes, Type 2  Renal/GU      Musculoskeletal   Abdominal   Peds  Hematology   Anesthesia Other Findings   Reproductive/Obstetrics                             Anesthesia Physical Anesthesia Plan  ASA: III  Anesthesia Plan: MAC   Post-op Pain Management:    Induction: Intravenous  PONV Risk Score and Plan:   Airway Management Planned: Simple Face Mask  Additional Equipment:   Intra-op Plan:   Post-operative Plan:   Informed Consent: I have reviewed the patients History and Physical, chart, labs and discussed the procedure including the risks, benefits and alternatives for the proposed anesthesia with the patient or authorized representative who has indicated his/her understanding and acceptance.     Plan Discussed with:   Anesthesia Plan Comments:         Anesthesia Quick Evaluation

## 2017-10-21 NOTE — Anesthesia Postprocedure Evaluation (Signed)
Anesthesia Post Note  Patient: Mariah Ibarra [facility]  Procedure(s) Performed: COLONOSCOPY WITH PROPOFOL (N/A )  Patient location during evaluation: PACU Anesthesia Type: MAC Level of consciousness: awake and alert, oriented and patient cooperative Pain management: pain level controlled Vital Signs Assessment: post-procedure vital signs reviewed and stable Respiratory status: spontaneous breathing and respiratory function stable Cardiovascular status: stable Postop Assessment: no apparent nausea or vomiting Anesthetic complications: no     Last Vitals:  Vitals:   10/21/17 1005 10/21/17 1045  BP: (!) 122/91 112/85  Pulse:    Resp: 10 19  Temp:  36.6 C  SpO2: 100% 100%    Last Pain:  Vitals:   10/21/17 1045  TempSrc:   PainSc: 0-No pain                 Sofiah Lyne A

## 2017-10-21 NOTE — Op Note (Signed)
[facility] Patient Name: Mariah PhilipsMelinda Bigford Procedure Date: 10/21/2017 9:37 AM MRN: [id number] Date of Birth: 12/22/1970 Attending MD: Lionel DecemberNajeeb Advith Martine , MD CSN: [id number] Age: [id number] Admit Type: Outpatient Procedure:                Colonoscopy Indications:              Abdominal pain in the left lower quadrant,                            Hematochezia Providers:                Lionel DecemberNajeeb Kennidi Yoshida, MD, Loma MessingLurae B. Patsy LagerAlbert RN, RN, Dyann Ruddleonya                            Wilson Referring MD:              Medicines:                Propofol per Anesthesia Complications:            No immediate complications. Estimated Blood Loss:     Estimated blood loss: none. Procedure:                Pre-Anesthesia Assessment:                           - Prior to the procedure, a History and Physical                            was performed, and patient medications and                            allergies were reviewed. The patient's tolerance of                            previous anesthesia was also reviewed. The risks                            and benefits of the procedure and the sedation                            options and risks were discussed with the patient.                            All questions were answered, and informed consent                            was obtained. Prior Anticoagulants: The patient has                            taken no previous anticoagulant or antiplatelet                            agents. ASA Grade Assessment: II - A patient with                            mild systemic  disease. After reviewing the risks                            and benefits, the patient was deemed in                            satisfactory condition to undergo the procedure.                           After obtaining informed consent, the colonoscope                            was passed under direct vision. Throughout the                            procedure, the patient's blood pressure, pulse, and                   oxygen saturations were monitored continuously. The                            EC-[id number] ([id number]) scope was introduced through                            the anus and advanced to the the cecum, identified                            by appendiceal orifice and ileocecal valve. The                            colonoscopy was performed without difficulty. The                            patient tolerated the procedure well. The quality                            of the bowel preparation was good. The ileocecal                            valve, appendiceal orifice, and rectum were                            photographed. Scope In: 10:19:18 AM Scope Out: 10:38:09 AM Scope Withdrawal Time: 0 hours 7 minutes 41 seconds  Total Procedure Duration: 0 hours 18 minutes 51 seconds  Findings:      The perianal and digital rectal examinations were normal.      A diffuse area of mild melanosis was found in the entire colon.      The exam was otherwise normal throughout the examined colon.      External hemorrhoids were found during retroflexion. The hemorrhoids       were small. Impression:               - Melanosis in the colon.                           -  External hemorrhoids.                           - No specimens collected. Moderate Sedation:      Per Anesthesia Care Recommendation:           - Patient has a contact number available for                            emergencies. The signs and symptoms of potential                            delayed complications were discussed with the                            patient. Return to normal activities tomorrow.                            Written discharge instructions were provided to the                            patient.                           - High fiber diet today.                           - Continue present medications.                           - Repeat colonoscopy in 10 years for screening                             purposes. Procedure Code(s):        --- Professional ---                           [phone], Colonoscopy, flexible; diagnostic, including                            collection of specimen(s) by brushing or washing,                            when performed (separate procedure) Diagnosis Code(s):        --- Professional ---                           K63.89, Other specified diseases of intestine                           K64.8, Other hemorrhoids                           R10.32, Left lower quadrant pain                           K92.1, Melena (includes Hematochezia) CPT copyright 2016 [employer]. All rights reserved. The codes  documented in this report are preliminary and upon coder review may  be revised to meet current compliance requirements. Lionel DecemberNajeeb Phylliss Strege, MD Lionel DecemberNajeeb Toryn Mcclinton, MD 10/21/2017 10:55:30 AM This report has been signed electronically. Number of Addenda: 0

## 2017-10-21 NOTE — Transfer of Care (Signed)
Immediate Anesthesia Transfer of Care Note  Patient: Mariah [facility]barra [facility]  Procedure(s) Performed: COLONOSCOPY WITH PROPOFOL (N/A )  Patient Location: PACU  Anesthesia Type:MAC  Level of Consciousness: awake  Airway & Oxygen Therapy: Patient Spontanous Breathing and Patient connected to nasal cannula oxygen  Post-op Assessment: Report given to RN  Post vital signs: Reviewed and stable  Last Vitals:  Vitals:   10/21/17 1000 10/21/17 1005  BP: 138/90 (!) 122/91  Pulse:    Resp: 15 10  Temp:    SpO2: 100% 100%    Last Pain:  Vitals:   10/21/17 1014  TempSrc:   PainSc: 0-No pain      Patients Stated Pain Goal: 8 (05/39/76 7341)  Complications: No apparent anesthesia complications

## 2017-10-21 NOTE — H&P (Signed)
Mariah AquasMelinda F Ethridge is an 46 y.o. female.   Chief Complaint: Patient is here for colonoscopy. HPI: Patient is 46 year old Caucasian female with multiple medical problems intermittent rectal bleeding usually with her bowel movements.  This has been going on for 3 years.  She also has left lower quadrant abdominal pain.  Bowels are irregular.  She either has diarrhea and/or constipation.  Patient is also undergoing evaluation for weight loss unexplained nausea and vomiting.  She was seen in the office earlier this week and her cortisol level was 1.7.  Cortisol level 4 weeks earlier was normal.  This is to be further evaluated with Cortrosyn stimulation test. Family history is negative for CRC.  Past Medical History:  Diagnosis Date  . Anxiety   . Hypercholesteremia   . Hypertension   . Panic attacks   . PTSD (post-traumatic stress disorder)   . Seizures (HCC)   . Stroke [facility](HCC)    Short term memory loss from 3rd brain surgery.  . Trigeminal neuralgia   . Type 2 diabetes mellitus (HCC)     Past Surgical History:  Procedure Laterality Date  . ABDOMINAL HYSTERECTOMY    . ABDOMINOPLASTY    . APPENDECTOMY    . Bladder tack    . BRAIN SURGERY     x4  . CERVICAL ABLATION    . ESOPHAGOGASTRODUODENOSCOPY N/A 01/13/2017   Procedure: ESOPHAGOGASTRODUODENOSCOPY (EGD);  Surgeon: Malissa HippoNajeeb U [place], MD;  Location: [facility] ENDO SUITE;  Service: Endoscopy;  Laterality: N/A;  3:00  . Gamma knife Procedure November 2013    . ORIF ANKLE FRACTURE Right 06/03/2016   Procedure: OPEN REDUCTION INTERNAL FIXATION (ORIF) RIGHT ANKLE;  Surgeon: Vickki HearingStanley E Harrison, MD;  Location: [facility] ORS;  Service: Orthopedics;  Laterality: Right;  . TONSILLECTOMY      Family History  Problem Relation Age of Onset  . Depression Paternal Aunt   . Depression Paternal Uncle   . Depression Maternal Grandfather   . Ovarian cancer Maternal Grandfather   . Uterine cancer Mother   . Breast cancer Mother   . Lung cancer Father        x3  .  Colon cancer Father   . Heart attack Father   . Hypertension Father   . Hyperlipidemia Father   . Leukemia Paternal Grandmother    Social History:  reports that  has never smoked. she has never used smokeless tobacco. She reports that she does not drink alcohol or use drugs.  Allergies:  Allergies  Allergen Reactions  . Baclofen Other (See Comments)    Seizure activity  . Lorazepam Other (See Comments)    Doesn't tolerate    Medications Prior to Admission  Medication Sig Dispense Refill  . atorvastatin (LIPITOR) 80 MG tablet Take 80 mg by mouth daily.    . bisacodyl (DULCOLAX) 10 MG suppository Place 1 suppository (10 mg total) rectally daily as needed for moderate constipation. 12 suppository 0  . cholecalciferol (VITAMIN D) 1000 units tablet Take 1,000 Units by mouth 4 (four) times daily.    . citalopram (CELEXA) 40 MG tablet Take 40 mg by mouth daily.    . diazepam (VALIUM) 5 MG tablet Take 5 mg by mouth every 12 (twelve) hours as needed for anxiety.    . gabapentin (NEURONTIN) 400 MG capsule Take 400 mg by mouth 3 (three) times daily.    Marland Kitchen. levETIRAcetam (KEPPRA) 750 MG tablet Take 1,500 mg by mouth 2 (two) times daily.    Marland Kitchen. lidocaine (XYLOCAINE) 2 %  jelly Apply 1 application topically as needed (pain).     Marland Kitchen. lisinopril-hydrochlorothiazide (PRINZIDE,ZESTORETIC) 20-25 MG tablet Take 1 tablet by mouth daily.    . magnesium hydroxide (MILK OF MAGNESIA) 400 MG/5ML suspension Take 15 mLs by mouth daily as needed for mild constipation.    . magnesium oxide (MAG-OX) 400 MG tablet Take 400 mg by mouth daily.    . mirtazapine (REMERON) 15 MG tablet Take 30 mg by mouth at bedtime.     . pioglitazone (ACTOS) 15 MG tablet Take 15 mg by mouth daily.    . polyvinyl alcohol-povidone (REFRESH) 1.4-0.6 % ophthalmic solution Apply 1-2 drops to eye daily.     . promethazine (PHENERGAN) 25 MG suppository Place 25 mg rectally every 6 (six) hours as needed for nausea or vomiting.    Marland Kitchen. tiZANidine  (ZANAFLEX) 4 MG tablet Take 4 mg by mouth 2 (two) times daily.     Marland Kitchen. zolpidem (AMBIEN) 10 MG tablet Take 10 mg by mouth at bedtime as needed for sleep.      Results for orders placed or performed during the hospital encounter of 10/21/17 (from the past 48 hour(s))  Glucose, capillary     Status: Abnormal   Collection Time: 10/21/17  9:15 AM  Result Value Ref Range   Glucose-Capillary 62 (L) 65 - 99 mg/dL   No results found.  ROS  Blood pressure 124/87, pulse (!) 103, temperature 97.7 F (36.5 C), temperature source Oral, resp. rate 19, height 5\' 2"  (1.575 m), weight 142 lb (64.4 kg), last menstrual period 11/01/2013, SpO2 100 %. Physical Exam  Constitutional: She appears well-developed and well-nourished.  HENT:  Mouth/Throat: Oropharynx is clear and moist.  Eyes: Conjunctivae are normal. No scleral icterus.  Neck: No thyromegaly present.  Cardiovascular: Normal rate, regular rhythm and normal heart sounds.  No murmur heard. Respiratory: Effort normal and breath sounds normal.  GI:  Long scar across lower abdomen.  Abdomen is soft with mild tenderness at LLQ.  No organomegaly or masses.  Musculoskeletal: She exhibits no edema.  Neurological: She is alert.  Skin: Skin is warm and dry.     Assessment/Plan Hematochezia and left lower quadrant abdominal pain. Diagnostic colonoscopy.  Lionel DecemberNajeeb Chaya Dehaan, MD 10/21/2017, 10:04 AM

## 2017-10-23 LAB — TISSUE TRANSGLUTAMINASE, IGA

## 2017-10-24 LAB — GLIADIN ANTIBODIES, SERUM
ANTIGLIADIN ABS, IGA: 4 U (ref 0–19)
Gliadin IgG: 3 units (ref 0–19)

## 2017-10-25 LAB — RETICULIN ANTIBODIES, IGA W TITER: RETICULIN AB, IGA: NEGATIVE {titer}

## 2017-10-27 ENCOUNTER — Telehealth (INDEPENDENT_AMBULATORY_CARE_PROVIDER_SITE_OTHER): Payer: Self-pay | Admitting: *Deleted

## 2017-10-27 ENCOUNTER — Encounter (HOSPITAL_COMMUNITY): Payer: Self-pay | Admitting: Internal Medicine

## 2017-10-27 NOTE — Telephone Encounter (Signed)
Patient is to have ACTH Stimulation test . [facility].

## 2017-10-28 ENCOUNTER — Encounter (HOSPITAL_COMMUNITY): Payer: Medicare Other

## 2017-10-28 ENCOUNTER — Encounter (HOSPITAL_COMMUNITY): Admission: RE | Admit: 2017-10-28 | Payer: Medicare Other | Source: Ambulatory Visit

## 2017-10-28 ENCOUNTER — Encounter (HOSPITAL_COMMUNITY)
Admission: RE | Admit: 2017-10-28 | Discharge: 2017-10-28 | Disposition: A | Discharge status: Home / Self Care | Payer: Medicare Other | Source: Ambulatory Visit | Attending: Internal Medicine | Admitting: Internal Medicine

## 2017-11-03 DIAGNOSIS — K1239 Other oral mucositis (ulcerative): Secondary | ICD-10-CM | POA: Diagnosis not present

## 2017-12-05 ENCOUNTER — Emergency Department (HOSPITAL_COMMUNITY)
Admission: EM | Admit: 2017-12-05 | Discharge: 2017-12-06 | Disposition: A | Discharge status: Home / Self Care | Payer: Medicare Other | Attending: Emergency Medicine | Admitting: Emergency Medicine

## 2017-12-05 ENCOUNTER — Other Ambulatory Visit: Payer: Self-pay

## 2017-12-05 ENCOUNTER — Encounter (HOSPITAL_COMMUNITY): Payer: Self-pay | Admitting: Emergency Medicine

## 2017-12-05 ENCOUNTER — Emergency Department (HOSPITAL_COMMUNITY): Payer: Medicare Other

## 2017-12-05 DIAGNOSIS — W108XXA Fall (on) (from) other stairs and steps, initial encounter: Secondary | ICD-10-CM | POA: Diagnosis not present

## 2017-12-05 DIAGNOSIS — H7292 Unspecified perforation of tympanic membrane, left ear: Secondary | ICD-10-CM | POA: Diagnosis not present

## 2017-12-05 DIAGNOSIS — S0990XA Unspecified injury of head, initial encounter: Secondary | ICD-10-CM | POA: Diagnosis not present

## 2017-12-05 DIAGNOSIS — S93401A Sprain of unspecified ligament of right ankle, initial encounter: Secondary | ICD-10-CM | POA: Diagnosis not present

## 2017-12-05 DIAGNOSIS — Y92009 Unspecified place in unspecified non-institutional (private) residence as the place of occurrence of the external cause: Secondary | ICD-10-CM | POA: Insufficient documentation

## 2017-12-05 DIAGNOSIS — Y998 Other external cause status: Secondary | ICD-10-CM | POA: Diagnosis not present

## 2017-12-05 DIAGNOSIS — H6692 Otitis media, unspecified, left ear: Secondary | ICD-10-CM | POA: Insufficient documentation

## 2017-12-05 DIAGNOSIS — E119 Type 2 diabetes mellitus without complications: Secondary | ICD-10-CM | POA: Insufficient documentation

## 2017-12-05 DIAGNOSIS — Z79899 Other long term (current) drug therapy: Secondary | ICD-10-CM | POA: Diagnosis not present

## 2017-12-05 DIAGNOSIS — S8251XD Displaced fracture of medial malleolus of right tibia, subsequent encounter for closed fracture with routine healing: Secondary | ICD-10-CM | POA: Diagnosis not present

## 2017-12-05 DIAGNOSIS — H6690 Otitis media, unspecified, unspecified ear: Secondary | ICD-10-CM

## 2017-12-05 DIAGNOSIS — Y939 Activity, unspecified: Secondary | ICD-10-CM | POA: Insufficient documentation

## 2017-12-05 DIAGNOSIS — S199XXA Unspecified injury of neck, initial encounter: Secondary | ICD-10-CM | POA: Diagnosis not present

## 2017-12-05 DIAGNOSIS — I10 Essential (primary) hypertension: Secondary | ICD-10-CM | POA: Diagnosis not present

## 2017-12-05 DIAGNOSIS — H7290 Unspecified perforation of tympanic membrane, unspecified ear: Secondary | ICD-10-CM

## 2017-12-05 DIAGNOSIS — W19XXXA Unspecified fall, initial encounter: Secondary | ICD-10-CM

## 2017-12-05 LAB — COMPREHENSIVE METABOLIC PANEL
ALBUMIN: 3.7 g/dL (ref 3.5–5.0)
ALT: 10 U/L — ABNORMAL LOW (ref 14–54)
ANION GAP: 11 (ref 5–15)
AST: 20 U/L (ref 15–41)
Alkaline Phosphatase: 29 U/L — ABNORMAL LOW (ref 38–126)
BILIRUBIN TOTAL: 0.6 mg/dL (ref 0.3–1.2)
BUN: 20 mg/dL (ref 6–20)
CHLORIDE: 101 mmol/L (ref 101–111)
CO2: 25 mmol/L (ref 22–32)
Calcium: 9.8 mg/dL (ref 8.9–10.3)
Creatinine, Ser: 1.3 mg/dL — ABNORMAL HIGH (ref 0.44–1.00)
GFR calc Af Amer: 56 mL/min — ABNORMAL LOW (ref >60)
GFR calc non Af Amer: 48 mL/min — ABNORMAL LOW (ref >60)
GLUCOSE: 97 mg/dL (ref 65–99)
POTASSIUM: 3.8 mmol/L (ref 3.5–5.1)
SODIUM: 137 mmol/L (ref 135–145)
Total Protein: 7.2 g/dL (ref 6.5–8.1)

## 2017-12-05 LAB — CBC
HEMATOCRIT: 42.8 % (ref 36.0–46.0)
Hemoglobin: 13.7 g/dL (ref 12.0–15.0)
MCH: 32.1 pg (ref 26.0–34.0)
MCHC: 32 g/dL (ref 30.0–36.0)
MCV: 100.2 fL — ABNORMAL HIGH (ref 78.0–100.0)
Platelets: 293 10*3/uL (ref 150–400)
RBC: 4.27 MIL/uL (ref 3.87–5.11)
RDW: 13.1 % (ref 11.5–15.5)
WBC: 9.2 10*3/uL (ref 4.0–10.5)

## 2017-12-05 LAB — CBG MONITORING, ED: GLUCOSE-CAPILLARY: 102 mg/dL — AB (ref 65–99)

## 2017-12-05 LAB — ETHANOL: Alcohol, Ethyl (B): <10 mg/dL (ref <10)

## 2017-12-05 MED ORDER — ONDANSETRON HCL 4 MG/2ML IJ SOLN
4.0000 mg | Freq: Once | INTRAMUSCULAR | Status: AC
  Administered 2017-12-05: 4 mg via INTRAVENOUS
  Filled 2017-12-05: qty 2

## 2017-12-05 MED ORDER — FENTANYL CITRATE (PF) 100 MCG/2ML IJ SOLN
50.0000 ug | Freq: Once | INTRAMUSCULAR | Status: AC
  Administered 2017-12-05: 50 ug via INTRAVENOUS
  Filled 2017-12-05: qty 2

## 2017-12-05 MED ORDER — SODIUM CHLORIDE 0.9 % IV BOLUS (SEPSIS)
1000.0000 mL | Freq: Once | INTRAVENOUS | Status: AC
  Administered 2017-12-05: 1000 mL via INTRAVENOUS

## 2017-12-05 NOTE — ED Provider Notes (Signed)
[facility] EMERGENCY DEPARTMENT Provider Note   CSN: [id number] Arrival date & time: 12/05/17  2010     History   Chief Complaint Chief Complaint  Patient presents with  . Fall    HPI Mariah Ibarra is a 47 y.o. female.  She presents for evaluation of injuries from fall, several days ago.  She states that she was at home, and fell down "20 concrete steps."  Her husband heard her fall, and found her at the bottom of the stairs.  At that point she was "out of it."  He did not see her have a seizure, but thinks that might be what happened.  Since then she has been struggling with pain at home, and her head, neck, and right ankle.  She denies nausea, vomiting, focal weakness or paresthesia.  She has had some red bloody drainage from her left ear since that time.  She is taking her medications as directed.  There are no other known modifying factors.  HPI  Past Medical History:  Diagnosis Date  . Anxiety   . Hypercholesteremia   . Hypertension   . Panic attacks   . PTSD (post-traumatic stress disorder)   . Seizures (HCC)   . Stroke [facility])    Short term memory loss from 3rd brain surgery.  . Trigeminal neuralgia   . Type 2 diabetes mellitus [facility])     Patient Active Problem List   Diagnosis Date Noted  . Non-intractable vomiting with nausea 10/19/2017  . Rectal bleeding 10/19/2017  . Symptomatic bradycardia 09/29/2017  . Symptomatic sinus bradycardia 09/27/2017  . Volume depletion 09/27/2017  . Syncope and collapse 09/27/2017  . UTI (urinary tract infection) 09/27/2017  . Left lower quadrant pain 09/27/2017  . Seizure-like activity (HCC)   . Loss of weight 12/23/2016  . Nausea without vomiting 12/23/2016  . Lisfranc dislocation, right, initial encounter 11/04/2016  . Fracture of ankle, trimalleolar, right, closed   . Thrush 12/03/2012  . Otitis externa, left 12/03/2012  . SIADH (syndrome of inappropriate ADH production) (HCC) 12/02/2012  . Hyponatremia 12/01/2012  .  Seizure (HCC) 12/01/2012  . High anion gap metabolic acidosis 12/01/2012  . Hypokalemia 12/01/2012  . Depression 12/01/2012  . Anxiety 12/01/2012  . Ataxia 08/24/2012  . Trigeminal neuralgia 08/24/2012  . Hypertension 08/24/2012  . Bradycardia 08/24/2012  . Hyperlipidemia 08/24/2012    Past Surgical History:  Procedure Laterality Date  . ABDOMINAL HYSTERECTOMY    . ABDOMINOPLASTY    . APPENDECTOMY    . Bladder tack    . BRAIN SURGERY     x4  . CERVICAL ABLATION    . COLONOSCOPY WITH PROPOFOL N/A 10/21/2017   Procedure: COLONOSCOPY WITH PROPOFOL;  Surgeon: Malissa Hippo, MD;  Location: [facility] ENDO SUITE;  Service: Endoscopy;  Laterality: N/A;  . ESOPHAGOGASTRODUODENOSCOPY N/A 01/13/2017   Procedure: ESOPHAGOGASTRODUODENOSCOPY (EGD);  Surgeon: Malissa Hippo, MD;  Location: [facility] ENDO SUITE;  Service: Endoscopy;  Laterality: N/A;  3:00  . Gamma knife Procedure November 2013    . ORIF ANKLE FRACTURE Right 06/03/2016   Procedure: OPEN REDUCTION INTERNAL FIXATION (ORIF) RIGHT ANKLE;  Surgeon: Vickki Hearing, MD;  Location: [facility] ORS;  Service: Orthopedics;  Laterality: Right;  . TONSILLECTOMY      OB History    Gravida Para Term Preterm AB Living   0 0 0 0 0     SAB TAB Ectopic Multiple Live Births   0 0 0  Home Medications    Prior to Admission medications   Medication Sig Start Date End Date Taking? Authorizing Provider  atorvastatin (LIPITOR) 80 MG tablet Take 80 mg by mouth daily.   Yes [provider]  bisacodyl (DULCOLAX) 10 MG suppository Place 1 suppository (10 mg total) rectally daily as needed for moderate constipation. 10/18/17  Yes Rehman, Joline Maxcy, MD  cholecalciferol (VITAMIN D) 1000 units tablet Take 1,000 Units by mouth 4 (four) times daily.   Yes [provider]  diazepam (VALIUM) 5 MG tablet Take 5 mg by mouth every 6 (six) hours as needed for anxiety.    Yes [provider]  gabapentin (NEURONTIN) 400 MG capsule Take 400 mg  by mouth 3 (three) times daily.   Yes [provider]  lidocaine (XYLOCAINE) 2 % jelly Apply 1 application topically as needed (pain).    Yes [provider]  lisinopril-hydrochlorothiazide (PRINZIDE,ZESTORETIC) 20-25 MG tablet Take 1 tablet by mouth daily.   Yes [provider]  magnesium hydroxide (MILK OF MAGNESIA) 400 MG/5ML suspension Take 15 mLs by mouth daily as needed for mild constipation.   Yes [provider]  magnesium oxide (MAG-OX) 400 MG tablet Take 400 mg by mouth daily.   Yes [provider]  polyvinyl alcohol-povidone (REFRESH) 1.4-0.6 % ophthalmic solution Apply 1-2 drops to eye daily.    Yes [provider]  promethazine (PHENERGAN) 25 MG suppository Place 25 mg rectally every 6 (six) hours as needed for nausea or vomiting.   Yes [provider]  tiZANidine (ZANAFLEX) 4 MG tablet Take 4 mg by mouth every 6 (six) hours as needed for muscle spasms.    Yes [provider]  zolpidem (AMBIEN) 10 MG tablet Take 10 mg by mouth at bedtime as needed for sleep.   Yes [provider]  amoxicillin (AMOXIL) 500 MG capsule Take 1 capsule (500 mg total) by mouth 3 (three) times daily. 12/06/17   Mancel Bale, MD  citalopram (CELEXA) 40 MG tablet Take 40 mg by mouth daily.    [provider]  levETIRAcetam (KEPPRA) 750 MG tablet Take 1,500 mg by mouth 2 (two) times daily.    [provider]  mirtazapine (REMERON) 15 MG tablet Take 30 mg by mouth at bedtime.     [provider]  pioglitazone (ACTOS) 15 MG tablet Take 15 mg by mouth daily.    [provider]    Family History Family History  Problem Relation Age of Onset  . Depression Paternal Aunt   . Depression Paternal Uncle   . Depression Maternal Grandfather   . Ovarian cancer Maternal Grandfather   . Uterine cancer Mother   . Breast cancer Mother   . Lung cancer Father        x3  . Colon cancer Father   . Heart  attack Father   . Hypertension Father   . Hyperlipidemia Father   . Leukemia Paternal Grandmother     Social History Social History   Tobacco Use  . Smoking status: Never Smoker  . Smokeless tobacco: Never Used  Substance Use Topics  . Alcohol use: Yes    Comment: occ  . Drug use: No     Allergies   Baclofen and Lorazepam   Review of Systems Review of Systems  All other systems reviewed and are negative.    Physical Exam Updated Vital Signs BP 118/71   Pulse 93   Temp (!) 97.3 F (36.3 C) (Oral)  Resp 17   Ht 5\' 2"  (1.575 m)   Wt 61.2 kg (135 lb)   LMP 11/01/2013   SpO2 95%   BMI 24.69 kg/m   Physical Exam  Constitutional: She is oriented to person, place, and time. She appears well-developed and well-nourished. She appears distressed (She is uncomfortable).  HENT:  Head: Normocephalic and atraumatic.  Right Ear: External ear normal.  Left Ear: External ear normal.  No scalp or skull defects or lacerations.  Right external auditory canal and TM are normal.  Left external auditory canal is normal.  Left TM is distorted, grayish green in color, without visible perforation, or bleeding on the tympanic membrane.  There is no postauricular hematoma or crepitation.  No facial injury or instability.  No trismus.  Eyes: Conjunctivae and EOM are normal. Pupils are equal, round, and reactive to light.  Neck: Normal range of motion and phonation normal. Neck supple.  Cardiovascular: Normal rate, regular rhythm and normal heart sounds.  Pulmonary/Chest: Effort normal and breath sounds normal. She exhibits no bony tenderness.  Abdominal: Soft. There is no tenderness.  Musculoskeletal: She exhibits tenderness (Tender and swollen right lateral ankle.  No deformity.).  Normal range of motion arm and left leg.  Neurological: She is alert and oriented to person, place, and time. No cranial nerve deficit or sensory deficit. She exhibits normal muscle tone. Coordination normal.    No dysarthria, or aphasia.  Skin: Skin is warm, dry and intact.  Psychiatric: She has a normal mood and affect. Her behavior is normal. Judgment and thought content normal.  Nursing note and vitals reviewed.    ED Treatments / Results  Labs (all labs ordered are listed, but only abnormal results are displayed) Labs Reviewed  CBC - Abnormal; Notable for the following components:      Result Value   MCV 100.2 (*)    All other components within normal limits  COMPREHENSIVE METABOLIC PANEL - Abnormal; Notable for the following components:   Creatinine, Ser 1.30 (*)    ALT 10 (*)    Alkaline Phosphatase 29 (*)    GFR calc non Af Amer 48 (*)    GFR calc Af Amer 56 (*)    All other components within normal limits  CBG MONITORING, ED - Abnormal; Notable for the following components:   Glucose-Capillary 102 (*)    All other components within normal limits  ETHANOL  PREGNANCY, URINE    EKG  EKG Interpretation None       Radiology Dg Ankle Complete Right  Result Date: 12/05/2017 CLINICAL DATA:  Ankle pain after fall down steps EXAM: RIGHT ANKLE - COMPLETE 3+ VIEW COMPARISON:  10/12/2016, 07/30/2016 FINDINGS: Patient is status post surgical plate and multiple screw fixation of distal fibular fracture with progressive interval healing. Status post screw fixation of medial malleolar fracture, also with progressive healing since prior radiograph. No definite acute displaced fracture is seen. The ankle mortise is symmetric. IMPRESSION: 1. No definite acute osseous abnormality 2. Postsurgical changes of the distal tibia and fibula with progressive healing of medial malleolar and distal fibular fractures. Electronically Signed   By: Jasmine PangKim  Fujinaga M.D.   On: 12/05/2017 22:40   Ct Head Wo Contrast  Result Date: 12/05/2017 CLINICAL DATA:  Fall down stairs, bleeding out of left ear EXAM: CT HEAD WITHOUT CONTRAST CT CERVICAL SPINE WITHOUT CONTRAST TECHNIQUE: Multidetector CT imaging of the head  and cervical spine was performed following the standard protocol without intravenous contrast. Multiplanar CT image reconstructions  of the cervical spine were also generated. COMPARISON:  10/02/2017, 09/30/2017, 09/27/2017 FINDINGS: CT HEAD FINDINGS Brain: No acute territorial infarction, hemorrhage, or intracranial mass is visualized. Stable ventricle size. Vascular: No hyperdense vessels.  No unexpected calcification Skull: Moderate left mastoid effusion. Small amount of fluid in the left middle ear. Increased soft tissue thickening and density within the left external auditory canal. Stable fixating plate posterior to the left mastoid with adjacent hyperdense material. Sinuses/Orbits: Mucosal thickening in the ethmoid sinuses. No acute orbital abnormality. Other: None CT CERVICAL SPINE FINDINGS Alignment: Reversal of cervical lordosis. No subluxation. Facet alignment within normal limits. Skull base and vertebrae: No acute fracture. No primary bone lesion or focal pathologic process. Soft tissues and spinal canal: No prevertebral fluid or swelling. No visible canal hematoma. Disc levels: Mild degenerative changes at C5-C6 and C6-C7. Increased irregular facet arthropathy on the right at C2-C3. Upper chest: Subcentimeter hypodense thyroid nodules. Lung apices clear Other: None IMPRESSION: 1. No CT evidence for acute intracranial abnormality. 2. Stable left suboccipital craniotomy changes. Moderate left mastoid effusion, new compared to prior CT, no definitive mastoid fracture lucency is seen. There is also small amount of fluid in the left middle ear and increased soft tissue density and thickening within the left external auditory canal, recommend correlation with direct inspection. 3. Reversal of cervical lordosis.  No acute fracture is seen. Electronically Signed   By: Jasmine Pang M.D.   On: 12/05/2017 23:03   Ct Cervical Spine Wo Contrast  Result Date: 12/05/2017 CLINICAL DATA:  Fall down stairs, bleeding  out of left ear EXAM: CT HEAD WITHOUT CONTRAST CT CERVICAL SPINE WITHOUT CONTRAST TECHNIQUE: Multidetector CT imaging of the head and cervical spine was performed following the standard protocol without intravenous contrast. Multiplanar CT image reconstructions of the cervical spine were also generated. COMPARISON:  10/02/2017, 09/30/2017, 09/27/2017 FINDINGS: CT HEAD FINDINGS Brain: No acute territorial infarction, hemorrhage, or intracranial mass is visualized. Stable ventricle size. Vascular: No hyperdense vessels.  No unexpected calcification Skull: Moderate left mastoid effusion. Small amount of fluid in the left middle ear. Increased soft tissue thickening and density within the left external auditory canal. Stable fixating plate posterior to the left mastoid with adjacent hyperdense material. Sinuses/Orbits: Mucosal thickening in the ethmoid sinuses. No acute orbital abnormality. Other: None CT CERVICAL SPINE FINDINGS Alignment: Reversal of cervical lordosis. No subluxation. Facet alignment within normal limits. Skull base and vertebrae: No acute fracture. No primary bone lesion or focal pathologic process. Soft tissues and spinal canal: No prevertebral fluid or swelling. No visible canal hematoma. Disc levels: Mild degenerative changes at C5-C6 and C6-C7. Increased irregular facet arthropathy on the right at C2-C3. Upper chest: Subcentimeter hypodense thyroid nodules. Lung apices clear Other: None IMPRESSION: 1. No CT evidence for acute intracranial abnormality. 2. Stable left suboccipital craniotomy changes. Moderate left mastoid effusion, new compared to prior CT, no definitive mastoid fracture lucency is seen. There is also small amount of fluid in the left middle ear and increased soft tissue density and thickening within the left external auditory canal, recommend correlation with direct inspection. 3. Reversal of cervical lordosis.  No acute fracture is seen. Electronically Signed   By: Jasmine Pang  M.D.   On: 12/05/2017 23:03    Procedures Procedures (including critical care time)  Medications Ordered in ED Medications  sodium chloride 0.9 % bolus 1,000 mL (1,000 mLs Intravenous New Bag/Given 12/05/17 2247)  fentaNYL (SUBLIMAZE) injection 50 mcg (50 mcg Intravenous Given 12/05/17 2257)  ondansetron (ZOFRAN)  injection 4 mg (4 mg Intravenous Given 12/05/17 2257)     Initial Impression / Assessment and Plan / ED Course  I have reviewed the triage vital signs and the nursing notes.  Pertinent labs & imaging results that were available during my care of the patient were reviewed by me and considered in my medical decision making (see chart for details).      Patient Vitals for the past 24 hrs:  BP Temp Temp src Pulse Resp SpO2 Height Weight  12/05/17 2300 118/71 - - - - - - -  12/05/17 2024 (!) 75/56 (!) 97.3 F (36.3 C) Oral 93 17 95 % 5\' 2"  (1.575 m) 61.2 kg (135 lb)    12:08 AM Reevaluation with update and discussion. After initial assessment and treatment, an updated evaluation reveals no further complaints, findings discussed with patient, and partner, all questions answered. Mancel Bale      Final Clinical Impressions(s) / ED Diagnoses   Final diagnoses:  Fall, initial encounter  Injury of head, initial encounter  Sprain of right ankle, unspecified ligament, initial encounter  Perforation of tympanic membrane due to otitis media   Fall without serious injury.  Imaging does not indicate acute intracranial, or spine injuries.  Right ankle x-ray shows intact hardware, without fracture.  Incidental otitis media with perforation left, seen clinically and on CT imaging.  Doubt serious bacterial infection or metabolic instability.  Nursing Notes Reviewed/ Care Coordinated Applicable Imaging Reviewed Interpretation of Laboratory Data incorporated into ED treatment  The patient appears reasonably screened and/or stabilized for discharge and I doubt any other medical  condition or other [facility] requiring further screening, evaluation, or treatment in the ED at this time prior to discharge.  Plan: Home Medications-continue current medications, OTC analgesia as needed; Home Treatments-rest, fluids, gradually advance diet; return here if the recommended treatment, does not improve the symptoms; Recommended follow up-PCP checkup 1 week.   ED Discharge Orders        Ordered    amoxicillin (AMOXIL) 500 MG capsule  3 times daily     12/06/17 0006       Mancel Bale, MD 12/06/17 0009

## 2017-12-05 NOTE — ED Triage Notes (Signed)
Pt fell down approx 20 stairs two days ago hitting the L side of her head where she has a metal plate, has had bleeding out of L ear since. Also c/o R ankle pain where she has a metal plate and screws. States she could have had LOC.

## 2017-12-06 ENCOUNTER — Telehealth: Payer: Self-pay | Admitting: Orthopedic Surgery

## 2017-12-06 MED ORDER — AMOXICILLIN 500 MG PO CAPS: 500.0000 mg | ORAL_CAPSULE | Freq: Three times a day (TID) | ORAL | 0 refills | Status: DC

## 2017-12-06 NOTE — Telephone Encounter (Signed)
Patient called wanting to let Dr. Romeo AppleHarrison know that she was seen in the ER last night because she fell down some steps. Stated that xrays were taken of her right ankle and was told that everything looked ok.Since she had surgery on it back on 06/03/2016, she  wanted to ask Dr. Romeo AppleHarrison to take a look at the xrays to be sure everything is ok, since she was told in the ER, to get him to look the xrays over, just to be sure.  Please advise

## 2017-12-06 NOTE — ED Notes (Signed)
Pt wheeled to waiting room. Pt verbalized understanding of discharge instructions.   

## 2017-12-06 NOTE — Discharge Instructions (Signed)
Take Tylenol or Motrin for pain.  We are prescribing an antibiotic to help treat the left ear infection.  Use the splint as needed for help with ankle discomfort.  Follow-up with your primary care doctor for a checkup.

## 2017-12-07 NOTE — Telephone Encounter (Signed)
Thanks, will advise when she calls back.

## 2017-12-07 NOTE — Telephone Encounter (Signed)
Ok  You can tell her I looked at the xrays I dont see anything wrong   Give it a week or 2 and if still hurting make appt

## 2017-12-07 NOTE — Telephone Encounter (Signed)
I spoke with patient and relayed per Dr Mort SawyersHarrison's response.  Opted to still go ahead and schedule appointment and aware if not needed, will call back to cancel.

## 2017-12-07 NOTE — Telephone Encounter (Signed)
Dr Romeo AppleHarrison can not treat the xrays, he has to examine the patient with the xrays. I called patient, advised her to make appointment if her ankle is painful. Left message for her to call me back.   To you FYI

## 2018-01-10 ENCOUNTER — Encounter (INDEPENDENT_AMBULATORY_CARE_PROVIDER_SITE_OTHER): Payer: Self-pay | Admitting: Internal Medicine

## 2018-01-10 ENCOUNTER — Ambulatory Visit (INDEPENDENT_AMBULATORY_CARE_PROVIDER_SITE_OTHER): Payer: Medicare Other | Admitting: Internal Medicine

## 2018-01-10 VITALS — BP 98/72 | HR 105 | Resp 18 | Ht 62.0 in | Wt 134.7 lb

## 2018-01-10 DIAGNOSIS — S80869S Insect bite (nonvenomous), unspecified lower leg, sequela: Secondary | ICD-10-CM

## 2018-01-10 DIAGNOSIS — R1032 Left lower quadrant pain: Secondary | ICD-10-CM | POA: Diagnosis not present

## 2018-01-10 DIAGNOSIS — R634 Abnormal weight loss: Secondary | ICD-10-CM

## 2018-01-10 DIAGNOSIS — W57XXXS Bitten or stung by nonvenomous insect and other nonvenomous arthropods, sequela: Secondary | ICD-10-CM

## 2018-01-10 DIAGNOSIS — R112 Nausea with vomiting, unspecified: Secondary | ICD-10-CM | POA: Diagnosis not present

## 2018-01-10 NOTE — Patient Instructions (Addendum)
Physician will call with results of blood test and further recommendations. Report to emergency room if you have a temp greater than 102 F. Can use Dulcolax suppository or Fleet enema if you go more than 2 days without a bowel movement.

## 2018-01-10 NOTE — Progress Notes (Signed)
Presenting complaint;  Nausea vomiting anorexia weight loss and abdominal pain.  Database and subjective:  Patient is 47 year old Caucasian female who was initially seen in our office in January 2018 for nausea vomiting diarrhea and abdominal pain.  She had abdominopelvic CT on 11/04/2016 revealing mild gastric distention raising the possibility of gastroparesis and/or gastric outlet obstruction and stable right renal cyst.  Her symptoms started after beach trip.  She was treated with metronidazole.  GI pathogen panel was recommended but not done.   Ultrasound on 11/11/2016 was negative for cholelithiasis or dilated bile duct.  No abnormality noted to liver and pancreas and once again cyst was noted in the right kidney.   She did not feel any better.  She underwent EGD on 01/13/2017 revealing normal esophagus multiple scars of gastric antrum as well as gastritis.  Gastric biopsy was negative for H. Pylori. Gastric emptying study on 01/25/2017 revealed 46% emptying at 1 hour, 52% emptying at 2 hours, 56% emptying at 3 hours and 89% emptying at 4 hours.  Study was borderline.  Normal is greater than 90% emptying at 4 hours. She then had a HIDA scan on 02/15/2017 and her EF was 69%.  Her symptoms are not reproduced.  She then also started to complain of dysphagia to solids and liquids. She had barium study on 03/29/2017 and was within normal limits.  Barium pill passed through the esophagus without any delay. She was hospitalized in November 2018 for breakthrough seizures and sinus bradycardia.  She was subsequently transferred to [facility].  During that admission she was seen because of rectal bleeding.  Colonoscopy was delayed until other issues have been sorted out.  She was finally able to undergo colonoscopy on 10/21/2017 revealing melanosis coli and external hemorrhoids. She had low cortisol level.  She was scheduled for ACTH stimulation test but she was no-show because of family issues.  Now  she returns with her significant other complaint.  She has multiple complaints.  She does not have epigastric pain but she complains of constant left lower quadrant pain.  Her bowels are irregular.  She has gone as many as 7 days without a bowel movement.  She is taking stool softener.  She continues to have nausea and vomiting.  She has at least 2-3 episodes per week.  She has intermittent hematochezia.  She generally sees small amount of bright red blood.  She remains with anorexia and weight loss.  She has lost another 8 pounds in the last 3 months.  Since January last year she has lost 48 pounds. She remains with dizziness and gait disorder.  She has had few falling episodes.  On one occasion she was admitted to [facility] for rhabdomyolysis and renal failure.  She also reports developing ulcers involving her mouth.  She was seen by her dentist and oral surgeon and treated with mouthwash with biopsy was planned but not done.  She also complains of feeling achy all over.  She says nobody cares.  She continues to have intermittent seizures.  Sherrine Maples states she had transient 2 weeks ago.  She is under care of neurologist. She states she had tick bite several weeks ago.  She wants to be tested for Lyme disease.     Current Medications: Outpatient Encounter Medications as of 01/10/2018  Medication Sig  . atorvastatin (LIPITOR) 80 MG tablet Take 80 mg by mouth daily.  . bisacodyl (DULCOLAX) 10 MG suppository Place 1 suppository (10 mg total) rectally daily as needed for  moderate constipation.  . cholecalciferol (VITAMIN D) 1000 units tablet Take 1,000 Units by mouth 4 (four) times daily.  . diazepam (VALIUM) 5 MG tablet Take 5 mg by mouth every 6 (six) hours as needed for anxiety.   . gabapentin (NEURONTIN) 400 MG capsule Take 400 mg by mouth 3 (three) times daily.  Marland Kitchen lidocaine (XYLOCAINE) 2 % jelly Apply 1 application topically as needed (pain).   Marland Kitchen lisinopril-hydrochlorothiazide (PRINZIDE,ZESTORETIC) 20-25 MG  tablet Take 1 tablet by mouth daily.  . magnesium hydroxide (MILK OF MAGNESIA) 400 MG/5ML suspension Take 15 mLs by mouth daily as needed for mild constipation.  . magnesium oxide (MAG-OX) 400 MG tablet Take 400 mg by mouth daily.  . polyvinyl alcohol-povidone (REFRESH) 1.4-0.6 % ophthalmic solution Apply 1-2 drops to eye daily.   . promethazine (PHENERGAN) 25 MG suppository Place 25 mg rectally every 6 (six) hours as needed for nausea or vomiting.  Marland Kitchen tiZANidine (ZANAFLEX) 4 MG tablet Take 4 mg by mouth every 6 (six) hours as needed for muscle spasms.   Marland Kitchen zolpidem (AMBIEN) 10 MG tablet Take 10 mg by mouth at bedtime as needed for sleep.  . [DISCONTINUED] amoxicillin (AMOXIL) 500 MG capsule Take 1 capsule (500 mg total) by mouth 3 (three) times daily. (Patient not taking: Reported on 01/10/2018)  . [DISCONTINUED] citalopram (CELEXA) 40 MG tablet Take 40 mg by mouth daily.  . [DISCONTINUED] levETIRAcetam (KEPPRA) 750 MG tablet Take 1,500 mg by mouth 2 (two) times daily.  . [DISCONTINUED] mirtazapine (REMERON) 15 MG tablet Take 30 mg by mouth at bedtime.   . [DISCONTINUED] pioglitazone (ACTOS) 15 MG tablet Take 15 mg by mouth daily.   No facility-administered encounter medications on file as of 01/10/2018.      Objective: Blood pressure 98/72, pulse (!) 105, resp. rate 18, height 5\' 2"  (1.575 m), weight 134 lb 11.2 oz (61.1 kg), last menstrual period 11/01/2013. Patient is alert and in no acute distress. Conjunctiva is pink. Sclera is nonicteric Oropharyngeal mucosa is normal. No neck masses or thyromegaly noted. Cardiac exam with regular rhythm normal S1 and S2. No murmur or gallop noted. Lungs are clear to auscultation. Abdomen abdomen is symmetrical.  She has scar across lower abdomen.  Bowel sounds are normal.  She has tenderness in left lower quadrant superficial palpation.  Abdomen is soft.  No organomegaly or masses. No LE edema or clubbing noted.  Labs/studies Results: Lab data from  December 05, 2017 WBC 9.2, H&H 13.7 and 42.8 and platelet count 293K. Serum sodium 137, potassium 3.8, chloride 101, CO2 25, glucose 97, BUN 20 and creatinine 1.30. Bilirubin 0.6, AP 29, AST 20, ALT 29, total protein 7.2 and albumin 3.7. Serum calcium 9.8.   Assessment:  #1.  Chronic nausea and vomiting.  Addison's disease remains in differential diagnosis.  Cortisol level in December 2018 was low.  She did not return for ACTH stimulation test or call to reschedule.  Will recheck fasting cortisol level if it is low we will proceed with therapy.  #2.  Continued weight loss secondary to anorexia.  #3.  LLQ abdominal pain appears to be wall pain.  She has undergone extensive testing and no visceral abnormality found to explain her pain.  #4.  History of tick bite.  #5 history of oropharyngeal ulcers.. This is well documented on her phone.  She was seen by her dentist and oral surgeon.  Although she does not have also sedated but this history suggest vasculitis etiology. Patient has low-grade fever today  but no localizing symptoms.   Plan:  Patient advised to go to emergency room if temp goes above 102 patient will go to the lab for following blood tests. Fasting cortisol level. Sed rate and ANA. B.Burgdorfi antibodies. Further recommendations to follow.   Office visit in 2 months.

## 2018-01-11 DIAGNOSIS — R634 Abnormal weight loss: Secondary | ICD-10-CM | POA: Diagnosis not present

## 2018-01-11 DIAGNOSIS — R112 Nausea with vomiting, unspecified: Secondary | ICD-10-CM | POA: Diagnosis not present

## 2018-01-12 LAB — ANA: ANA: POSITIVE — AB

## 2018-01-12 LAB — ANTI-NUCLEAR AB-TITER (ANA TITER): ANA Titer 1: 1:160 {titer} — ABNORMAL HIGH

## 2018-01-12 LAB — B. BURGDORFI ANTIBODIES

## 2018-01-12 LAB — CORTISOL: CORTISOL PLASMA: 20.1 ug/dL

## 2018-01-12 LAB — SEDIMENTATION RATE: SED RATE: 25 mm/h — AB (ref 0–20)

## 2018-03-09 ENCOUNTER — Other Ambulatory Visit: Payer: Self-pay

## 2018-03-09 ENCOUNTER — Emergency Department (HOSPITAL_COMMUNITY)
Admission: EM | Admit: 2018-03-09 | Discharge: 2018-03-09 | Disposition: A | Discharge status: Home / Self Care | Payer: Medicare Other | Attending: Emergency Medicine | Admitting: Emergency Medicine

## 2018-03-09 ENCOUNTER — Encounter (HOSPITAL_COMMUNITY): Payer: Self-pay

## 2018-03-09 DIAGNOSIS — R51 Headache: Secondary | ICD-10-CM | POA: Diagnosis not present

## 2018-03-09 DIAGNOSIS — Z8673 Personal history of transient ischemic attack (TIA), and cerebral infarction without residual deficits: Secondary | ICD-10-CM | POA: Diagnosis not present

## 2018-03-09 DIAGNOSIS — R197 Diarrhea, unspecified: Secondary | ICD-10-CM | POA: Diagnosis not present

## 2018-03-09 DIAGNOSIS — Z79899 Other long term (current) drug therapy: Secondary | ICD-10-CM | POA: Insufficient documentation

## 2018-03-09 DIAGNOSIS — I10 Essential (primary) hypertension: Secondary | ICD-10-CM | POA: Diagnosis not present

## 2018-03-09 DIAGNOSIS — R001 Bradycardia, unspecified: Secondary | ICD-10-CM

## 2018-03-09 DIAGNOSIS — R1110 Vomiting, unspecified: Secondary | ICD-10-CM | POA: Diagnosis not present

## 2018-03-09 DIAGNOSIS — R5383 Other fatigue: Secondary | ICD-10-CM | POA: Diagnosis not present

## 2018-03-09 LAB — COMPREHENSIVE METABOLIC PANEL
ALK PHOS: 20 U/L — AB (ref 38–126)
ALT: 12 U/L — ABNORMAL LOW (ref 14–54)
AST: 14 U/L — AB (ref 15–41)
Albumin: 3.5 g/dL (ref 3.5–5.0)
Anion gap: 8 (ref 5–15)
BILIRUBIN TOTAL: 0.8 mg/dL (ref 0.3–1.2)
BUN: 12 mg/dL (ref 6–20)
CALCIUM: 8.8 mg/dL — AB (ref 8.9–10.3)
CO2: 25 mmol/L (ref 22–32)
CREATININE: 0.84 mg/dL (ref 0.44–1.00)
Chloride: 107 mmol/L (ref 101–111)
GFR calc Af Amer: >60 mL/min (ref >60)
GLUCOSE: 97 mg/dL (ref 65–99)
POTASSIUM: 3.8 mmol/L (ref 3.5–5.1)
Sodium: 140 mmol/L (ref 135–145)
TOTAL PROTEIN: 6.3 g/dL — AB (ref 6.5–8.1)

## 2018-03-09 LAB — CBC WITH DIFFERENTIAL/PLATELET
BASOS ABS: 0 10*3/uL (ref 0.0–0.1)
BASOS PCT: 1 %
EOS ABS: 0.5 10*3/uL (ref 0.0–0.7)
EOS PCT: 7 %
HCT: 40 % (ref 36.0–46.0)
Hemoglobin: 13.5 g/dL (ref 12.0–15.0)
LYMPHS PCT: 62 %
Lymphs Abs: 5.1 10*3/uL — ABNORMAL HIGH (ref 0.7–4.0)
MCH: 32.7 pg (ref 26.0–34.0)
MCHC: 33.8 g/dL (ref 30.0–36.0)
MCV: 96.9 fL (ref 78.0–100.0)
MONO ABS: 0.6 10*3/uL (ref 0.1–1.0)
Monocytes Relative: 7 %
Neutro Abs: 2 10*3/uL (ref 1.7–7.7)
Neutrophils Relative %: 25 %
Platelets: 263 10*3/uL (ref 150–400)
RBC: 4.13 MIL/uL (ref 3.87–5.11)
RDW: 12.9 % (ref 11.5–15.5)
WBC: 8.2 10*3/uL (ref 4.0–10.5)

## 2018-03-09 LAB — LIPASE, BLOOD: Lipase: 31 U/L (ref 11–51)

## 2018-03-09 MED ORDER — ONDANSETRON HCL 4 MG/2ML IJ SOLN
4.0000 mg | Freq: Once | INTRAMUSCULAR | Status: AC
  Administered 2018-03-09: 4 mg via INTRAVENOUS
  Filled 2018-03-09: qty 2

## 2018-03-09 MED ORDER — FENTANYL CITRATE (PF) 100 MCG/2ML IJ SOLN
50.0000 ug | Freq: Once | INTRAMUSCULAR | Status: AC
Start: 2018-03-09 — End: 2018-03-09
  Administered 2018-03-09: 50 ug via INTRAVENOUS
  Filled 2018-03-09: qty 2

## 2018-03-09 MED ORDER — SODIUM CHLORIDE 0.9 % IV BOLUS
1000.0000 mL | Freq: Once | INTRAVENOUS | Status: AC
Start: 2018-03-09 — End: 2018-03-09
  Administered 2018-03-09: 1000 mL via INTRAVENOUS

## 2018-03-09 MED ORDER — ONDANSETRON 8 MG PO TBDP: 8.0000 mg | ORAL_TABLET | Freq: Three times a day (TID) | ORAL | 0 refills | Status: AC | PRN

## 2018-03-09 MED ORDER — SODIUM CHLORIDE 0.9 % IV BOLUS
1000.0000 mL | Freq: Once | INTRAVENOUS | Status: AC
  Administered 2018-03-09: 1000 mL via INTRAVENOUS

## 2018-03-09 NOTE — ED Notes (Signed)
Ambulated Pt around [employer]. Pts gait mildly unsteady, but Pt expressed encouragement and stated it is her norm to be "wobbly" due to her Med Hx and conditions.

## 2018-03-09 NOTE — ED Provider Notes (Signed)
[facility] EMERGENCY DEPARTMENT Provider Note   CSN: [id number] Arrival date & time: 03/09/18  8469     History   Chief Complaint Chief Complaint  Patient presents with  . Nausea    HPI Mariah Ibarra is a 47 y.o. female.  The history is provided by the patient and a significant other.  Emesis   This is a new problem. The current episode started more than 2 days ago. The problem has been gradually worsening. There has been no fever. Associated symptoms include abdominal pain, chills and diarrhea.   Pt with h/o of anxiety, seizures, PTSD presents with vomiting and diarrhea. Reports about 3 days ago she began having headaches, since that time is been having vomiting diarrhea.  It has been nonbloody.  No fevers.  She reports diffuse abdominal pain.  She also reports generalized fatigue. No travel.  No sick contacts Past Medical History:  Diagnosis Date  . Anxiety   . Hypercholesteremia   . Hypertension   . Panic attacks   . PTSD (post-traumatic stress disorder)   . Seizures (HCC)   . Stroke [facility])    Short term memory loss from 3rd brain surgery.  . Trigeminal neuralgia     Patient Active Problem List   Diagnosis Date Noted  . Non-intractable vomiting with nausea 10/19/2017  . Rectal bleeding 10/19/2017  . Symptomatic bradycardia 09/29/2017  . Symptomatic sinus bradycardia 09/27/2017  . Volume depletion 09/27/2017  . Syncope and collapse 09/27/2017  . UTI (urinary tract infection) 09/27/2017  . Left lower quadrant pain 09/27/2017  . Seizure-like activity (HCC)   . Loss of weight 12/23/2016  . Nausea without vomiting 12/23/2016  . Lisfranc dislocation, right, initial encounter 11/04/2016  . Fracture of ankle, trimalleolar, right, closed   . Thrush 12/03/2012  . Otitis externa, left 12/03/2012  . SIADH (syndrome of inappropriate ADH production) (HCC) 12/02/2012  . Hyponatremia 12/01/2012  . Seizure (HCC) 12/01/2012  . High anion gap metabolic acidosis 12/01/2012   . Hypokalemia 12/01/2012  . Depression 12/01/2012  . Anxiety 12/01/2012  . Ataxia 08/24/2012  . Trigeminal neuralgia 08/24/2012  . Hypertension 08/24/2012  . Bradycardia 08/24/2012  . Hyperlipidemia 08/24/2012    Past Surgical History:  Procedure Laterality Date  . ABDOMINAL HYSTERECTOMY    . ABDOMINOPLASTY    . APPENDECTOMY    . Bladder tack    . BRAIN SURGERY     x4  . CERVICAL ABLATION    . COLONOSCOPY WITH PROPOFOL N/A 10/21/2017   Procedure: COLONOSCOPY WITH PROPOFOL;  Surgeon: Malissa Hippo, MD;  Location: [facility] ENDO SUITE;  Service: Endoscopy;  Laterality: N/A;  . ESOPHAGOGASTRODUODENOSCOPY N/A 01/13/2017   Procedure: ESOPHAGOGASTRODUODENOSCOPY (EGD);  Surgeon: Malissa Hippo, MD;  Location: [facility] ENDO SUITE;  Service: Endoscopy;  Laterality: N/A;  3:00  . Gamma knife Procedure November 2013    . ORIF ANKLE FRACTURE Right 06/03/2016   Procedure: OPEN REDUCTION INTERNAL FIXATION (ORIF) RIGHT ANKLE;  Surgeon: Vickki Hearing, MD;  Location: [facility] ORS;  Service: Orthopedics;  Laterality: Right;  . TONSILLECTOMY       OB History    Gravida  0   Para  0   Term  0   Preterm  0   AB  0   Living        SAB  0   TAB  0   Ectopic  0   Multiple      Live Births  Home Medications    Prior to Admission medications   Medication Sig Start Date End Date Taking? Authorizing Provider  atorvastatin (LIPITOR) 80 MG tablet Take 80 mg by mouth daily.   Yes [provider]  bisacodyl (DULCOLAX) 10 MG suppository Place 1 suppository (10 mg total) rectally daily as needed for moderate constipation. 10/18/17  Yes Rehman, Joline Maxcy, MD  cholecalciferol (VITAMIN D) 1000 units tablet Take 1,000 Units by mouth 4 (four) times daily.   Yes [provider]  diazepam (VALIUM) 5 MG tablet Take 5 mg by mouth every 6 (six) hours as needed for anxiety.    Yes [provider]  gabapentin (NEURONTIN) 400 MG capsule Take 400 mg by mouth 3 (three)  times daily.   Yes [provider]  lisinopril-hydrochlorothiazide (PRINZIDE,ZESTORETIC) 20-25 MG tablet Take 1 tablet by mouth daily.   Yes [provider]  magnesium oxide (MAG-OX) 400 MG tablet Take 400 mg by mouth daily.   Yes [provider]  polyvinyl alcohol-povidone (REFRESH) 1.4-0.6 % ophthalmic solution Apply 1-2 drops to eye daily.    Yes [provider]  promethazine (PHENERGAN) 25 MG suppository Place 25 mg rectally every 6 (six) hours as needed for nausea or vomiting.   Yes [provider]  tiZANidine (ZANAFLEX) 4 MG tablet Take 4 mg by mouth every 6 (six) hours as needed for muscle spasms.    Yes [provider]  zolpidem (AMBIEN) 10 MG tablet Take 10 mg by mouth at bedtime as needed for sleep.   Yes [provider]  lidocaine (XYLOCAINE) 2 % jelly Apply 1 application topically as needed (pain).     [provider]  magnesium hydroxide (MILK OF MAGNESIA) 400 MG/5ML suspension Take 15 mLs by mouth daily as needed for mild constipation.    [provider]    Family History Family History  Problem Relation Age of Onset  . Depression Paternal Aunt   . Depression Paternal Uncle   . Depression Maternal Grandfather   . Ovarian cancer Maternal Grandfather   . Uterine cancer Mother   . Breast cancer Mother   . Lung cancer Father        x3  . Colon cancer Father   . Heart attack Father   . Hypertension Father   . Hyperlipidemia Father   . Leukemia Paternal Grandmother     Social History Social History   Tobacco Use  . Smoking status: Never Smoker  . Smokeless tobacco: Never Used  Substance Use Topics  . Alcohol use: Yes    Comment: occ  . Drug use: No     Allergies   Baclofen and Lorazepam   Review of Systems Review of Systems  Constitutional: Positive for chills and fatigue.  Gastrointestinal: Positive for abdominal pain, diarrhea and vomiting. Negative for blood in stool.  All  other systems reviewed and are negative.    Physical Exam Updated Vital Signs BP 100/73 (BP Location: Left Arm)   Pulse (!) 47   Temp (!) 97.4 F (36.3 C) (Oral)   Resp 16   Ht 1.575 m ( )   Wt 59 kg (130 lb)   LMP 11/01/2013   SpO2 99%   BMI 23.78 kg/m   Physical Exam CONSTITUTIONAL: Appears older than stated age HEAD: Normocephalic/atraumatic EYES: EOMI/PERRL, no icterus ENMT: Mucous membranes dry NECK: supple no meningeal signs SPINE/BACK:entire spine nontender CV: S1/S2 noted, bradycardic LUNGS: Lungs are clear to auscultation bilaterally, no apparent distress ABDOMEN: soft, mild periumbilical  tenderness, no rebound or guarding, bowel sounds noted throughout abdomen GU:no cva tenderness NEURO: Pt is awake/alert/appropriate, moves all extremitiesx4.  No facial droop.   EXTREMITIES: pulses normal/equal, full ROM SKIN: warm, color normal PSYCH:flat affect   ED Treatments / Results  Labs (all labs ordered are listed, but only abnormal results are displayed) Labs Reviewed  COMPREHENSIVE METABOLIC PANEL - Abnormal; Notable for the following components:      Result Value   Calcium 8.8 (*)    Total Protein 6.3 (*)    AST 14 (*)    ALT 12 (*)    Alkaline Phosphatase 20 (*)    All other components within normal limits  CBC WITH DIFFERENTIAL/PLATELET - Abnormal; Notable for the following components:   Lymphs Abs 5.1 (*)    All other components within normal limits  LIPASE, BLOOD    EKG EKG Interpretation  Date/Time:  Thursday Mar 09 2018 03:37:17 EDT Ventricular Rate:  48 PR Interval:    QRS Duration: 83 QT Interval:  488 QTC Calculation: 436 R Axis:   64 Text Interpretation:  Sinus bradycardia ST elev, probable normal early repol pattern Interpretation limited secondary to artifact Confirmed by Zadie Rhine ([id number]) on 03/09/2018 4:07:29 AM   Radiology No results found.  Procedures Procedures    Medications Ordered in ED Medications  sodium  chloride 0.9 % bolus 1,000 mL (0 mLs Intravenous Stopped 03/09/18 0528)  ondansetron (ZOFRAN) injection 4 mg (4 mg Intravenous Given 03/09/18 0405)  sodium chloride 0.9 % bolus 1,000 mL (0 mLs Intravenous Stopped 03/09/18 0625)  fentaNYL (SUBLIMAZE) injection 50 mcg (50 mcg Intravenous Given 03/09/18 0452)     Initial Impression / Assessment and Plan / ED Course  I have reviewed the triage vital signs and the nursing notes.  Pertinent labs & imaging results that were available during my care of the patient were reviewed by me and considered in my medical decision making (see chart for details).     4:36 AM Patient presents with vomiting/diarrhea/HA for 3 days as well as headache.  She appears dehydrated. Also noted to have bradycardia, which is been evaluated previously per cardiology.  There was concern it could be due to increased vagal tone Labs are pending at this time 6:49 AM Feels improved.  She ambulated and felt at her baseline though she did report feeling mildly unsteady Recheck heart rate when I was in the room was in the 50s.  Her EKG did not reveal any AV block Labs reassuring.  She requests Zofran at discharge  She has had her sinus bradycardia evaluated previously in hospital.  I feel this can be managed as an outpatient she has had no episodes of syncope in the past several days It was felt previously be due to vagal response of pain/vomiting  Final Clinical Impressions(s) / ED Diagnoses   Final diagnoses:  Vomiting and diarrhea  Sinus bradycardia    ED Discharge Orders        Ordered    ondansetron (ZOFRAN ODT) 8 MG disintegrating tablet  Every 8 hours PRN     03/09/18 0647       Zadie Rhine, MD 03/09/18 [phone]

## 2018-03-09 NOTE — ED Triage Notes (Signed)
Pt arrives from home via POV c/o N/V/D over the past 3 days. Pt presents with chronic headache, abdominal pain, and OTC medications have been ineffective.

## 2018-03-13 ENCOUNTER — Other Ambulatory Visit: Payer: Self-pay

## 2018-03-13 ENCOUNTER — Encounter (HOSPITAL_COMMUNITY): Payer: Self-pay | Admitting: Emergency Medicine

## 2018-03-13 ENCOUNTER — Emergency Department (HOSPITAL_COMMUNITY)
Admission: EM | Admit: 2018-03-13 | Discharge: 2018-03-14 | Discharge status: Against Medical Advice | Payer: Medicare Other | Attending: Emergency Medicine | Admitting: Emergency Medicine

## 2018-03-13 DIAGNOSIS — R51 Headache: Secondary | ICD-10-CM | POA: Diagnosis not present

## 2018-03-13 DIAGNOSIS — R519 Headache, unspecified: Secondary | ICD-10-CM

## 2018-03-13 DIAGNOSIS — Z79899 Other long term (current) drug therapy: Secondary | ICD-10-CM | POA: Insufficient documentation

## 2018-03-13 DIAGNOSIS — I10 Essential (primary) hypertension: Secondary | ICD-10-CM | POA: Insufficient documentation

## 2018-03-13 DIAGNOSIS — R112 Nausea with vomiting, unspecified: Secondary | ICD-10-CM | POA: Insufficient documentation

## 2018-03-13 NOTE — ED Triage Notes (Signed)
Pt c/o severe headache with vomiting x one week.

## 2018-03-14 ENCOUNTER — Ambulatory Visit (INDEPENDENT_AMBULATORY_CARE_PROVIDER_SITE_OTHER): Payer: Self-pay | Admitting: Internal Medicine

## 2018-03-14 MED ORDER — SODIUM CHLORIDE 0.9 % IV BOLUS
500.0000 mL | Freq: Once | INTRAVENOUS | Status: AC
  Administered 2018-03-14: 500 mL via INTRAVENOUS

## 2018-03-14 MED ORDER — VALPROATE SODIUM 500 MG/5ML IV SOLN
INTRAVENOUS | Status: AC
  Filled 2018-03-14: qty 10

## 2018-03-14 MED ORDER — MAGNESIUM SULFATE 2 GM/50ML IV SOLN
2.0000 g | Freq: Once | INTRAVENOUS | Status: AC
  Administered 2018-03-14: 2 g via INTRAVENOUS
  Filled 2018-03-14: qty 50

## 2018-03-14 MED ORDER — DIPHENHYDRAMINE HCL 50 MG/ML IJ SOLN
25.0000 mg | Freq: Once | INTRAMUSCULAR | Status: AC
  Administered 2018-03-14: 25 mg via INTRAVENOUS
  Filled 2018-03-14: qty 1

## 2018-03-14 MED ORDER — DEXAMETHASONE SODIUM PHOSPHATE 10 MG/ML IJ SOLN
10.0000 mg | Freq: Once | INTRAMUSCULAR | Status: AC
  Administered 2018-03-14: 10 mg via INTRAVENOUS
  Filled 2018-03-14: qty 1

## 2018-03-14 MED ORDER — VALPROATE SODIUM 500 MG/5ML IV SOLN
1000.0000 mg | Freq: Once | INTRAVENOUS | Status: AC
  Administered 2018-03-14: 1000 mg via INTRAVENOUS
  Filled 2018-03-14: qty 10

## 2018-03-14 MED ORDER — SODIUM CHLORIDE 0.9 % IV BOLUS
1000.0000 mL | Freq: Once | INTRAVENOUS | Status: AC
  Administered 2018-03-14: 1000 mL via INTRAVENOUS

## 2018-03-14 MED ORDER — METOCLOPRAMIDE HCL 5 MG/ML IJ SOLN
10.0000 mg | Freq: Once | INTRAMUSCULAR | Status: AC
  Administered 2018-03-14: 10 mg via INTRAVENOUS
  Filled 2018-03-14: qty 2

## 2018-03-14 MED ORDER — SUMATRIPTAN SUCCINATE 6 MG/0.5ML ~~LOC~~ SOLN
6.0000 mg | Freq: Once | SUBCUTANEOUS | Status: AC
  Administered 2018-03-14: 6 mg via SUBCUTANEOUS
  Filled 2018-03-14: qty 0.5

## 2018-03-14 NOTE — ED Provider Notes (Signed)
[facility] EMERGENCY DEPARTMENT Provider Note   CSN: [id number] Arrival date & time: 03/13/18  2238  Time seen 12:12 AM  History   Chief Complaint Chief Complaint  Patient presents with  . Headache    HPI Mariah Ibarra is a 47 y.o. female.  HPI patient relates a history of chronic pain in her left face from trigeminal neuralgia.  She also has a history of migraine headaches and states she basically gets headaches daily.  She states she has had a bad headache however for the past 5 to 6 days.  She was seen in the ED on March 9 and was treated for her headache and states she was well for 1 to 2 days then the headache returned.  She states that headache is holoacranial.  She states that sharp, dull, stinging, stabbing, and burning.  She states it feels like she has a blowtorch and an ice pick in her head.  She has had nausea and vomiting for the past 5 days.  She states she had a fever of 101 this afternoon and she took TheraFlu.  She denies any visual changes.  She describes photophobia and phonophobia and states she usually sleep makes the headache better.  She states she gets injections by her neurologist, Dr. Gerilyn Pilgrim.  These help control her trigeminal neuralgia pain and headaches.  She states she used to get Botox but now she gets something else.  The last time she had it done was 6 months ago, she has an appointment later this month.  She states the injections do help.  She also states she has a seizure disorder.  She states she has had 7 seizures while she is been in the ED tonight.  Nursing staff have observed him and states she has tightness all over and she turns red and then she immediately starts talking and interacting.  She states she has tried CVD oil.  She was prescribed Dilantin about a week ago.  She is also been on Lamictal, Keppra, and gabapentin for her seizures.  She states she had a EEG before that was normal and she is scheduled to be admitted to the hospital at the [facility] later this month to get a 5-day EEG off medications.  She also complains of having some difficulty walking and feeling off balance.  She has had surgery on her left ankle.  When asked patient why she has a plate in her head she states it was from a nerve injury.  However she cannot tell me how her nerve got injured.  PCP [facility] Dr Gerilyn Pilgrim   Past Medical History:  Diagnosis Date  . Anxiety   . Hypercholesteremia   . Hypertension   . Panic attacks   . PTSD (post-traumatic stress disorder)   . Seizures (HCC)   . Stroke [facility])    Short term memory loss from 3rd brain surgery.  . Trigeminal neuralgia     Patient Active Problem List   Diagnosis Date Noted  . Non-intractable vomiting with nausea 10/19/2017  . Rectal bleeding 10/19/2017  . Symptomatic bradycardia 09/29/2017  . Symptomatic sinus bradycardia 09/27/2017  . Volume depletion 09/27/2017  . Syncope and collapse 09/27/2017  . UTI (urinary tract infection) 09/27/2017  . Left lower quadrant pain 09/27/2017  . Seizure-like activity (HCC)   . Loss of weight 12/23/2016  . Nausea without vomiting 12/23/2016  . Lisfranc dislocation, right, initial encounter 11/04/2016  . Fracture of ankle, trimalleolar, right, closed   .  Thrush 12/03/2012  . Otitis externa, left 12/03/2012  . SIADH (syndrome of inappropriate ADH production) (HCC) 12/02/2012  . Hyponatremia 12/01/2012  . Seizure (HCC) 12/01/2012  . High anion gap metabolic acidosis 12/01/2012  . Hypokalemia 12/01/2012  . Depression 12/01/2012  . Anxiety 12/01/2012  . Ataxia 08/24/2012  . Trigeminal neuralgia 08/24/2012  . Hypertension 08/24/2012  . Bradycardia 08/24/2012  . Hyperlipidemia 08/24/2012    Past Surgical History:  Procedure Laterality Date  . ABDOMINAL HYSTERECTOMY    . ABDOMINOPLASTY    . APPENDECTOMY    . Bladder tack    . BRAIN SURGERY     x4  . CERVICAL ABLATION    . COLONOSCOPY WITH PROPOFOL N/A 10/21/2017   Procedure:  COLONOSCOPY WITH PROPOFOL;  Surgeon: Malissa Hippo, MD;  Location: [facility] ENDO SUITE;  Service: Endoscopy;  Laterality: N/A;  . ESOPHAGOGASTRODUODENOSCOPY N/A 01/13/2017   Procedure: ESOPHAGOGASTRODUODENOSCOPY (EGD);  Surgeon: Malissa Hippo, MD;  Location: [facility] ENDO SUITE;  Service: Endoscopy;  Laterality: N/A;  3:00  . Gamma knife Procedure November 2013    . ORIF ANKLE FRACTURE Right 06/03/2016   Procedure: OPEN REDUCTION INTERNAL FIXATION (ORIF) RIGHT ANKLE;  Surgeon: Vickki Hearing, MD;  Location: [facility] ORS;  Service: Orthopedics;  Laterality: Right;  . TONSILLECTOMY       OB History    Gravida  0   Para  0   Term  0   Preterm  0   AB  0   Living        SAB  0   TAB  0   Ectopic  0   Multiple      Live Births               Home Medications    Prior to Admission medications   Medication Sig Start Date End Date Taking? Authorizing Provider  atorvastatin (LIPITOR) 80 MG tablet Take 80 mg by mouth daily.    [provider]  bisacodyl (DULCOLAX) 10 MG suppository Place 1 suppository (10 mg total) rectally daily as needed for moderate constipation. 10/18/17   Malissa Hippo, MD  cholecalciferol (VITAMIN D) 1000 units tablet Take 1,000 Units by mouth 4 (four) times daily.    [provider]  diazepam (VALIUM) 5 MG tablet Take 5 mg by mouth every 6 (six) hours as needed for anxiety.     [provider]  gabapentin (NEURONTIN) 400 MG capsule Take 400 mg by mouth 3 (three) times daily.    [provider]  lidocaine (XYLOCAINE) 2 % jelly Apply 1 application topically as needed (pain).     [provider]  lisinopril-hydrochlorothiazide (PRINZIDE,ZESTORETIC) 20-25 MG tablet Take 1 tablet by mouth daily.    [provider]  magnesium hydroxide (MILK OF MAGNESIA) 400 MG/5ML suspension Take 15 mLs by mouth daily as needed for mild constipation.    [provider]  magnesium oxide (MAG-OX) 400 MG tablet Take 400 mg  by mouth daily.    [provider]  ondansetron (ZOFRAN ODT) 8 MG disintegrating tablet Take 1 tablet (8 mg total) by mouth every 8 (eight) hours as needed. 03/09/18   Zadie Rhine, MD  polyvinyl alcohol-povidone (REFRESH) 1.4-0.6 % ophthalmic solution Apply 1-2 drops to eye daily.     [provider]  promethazine (PHENERGAN) 25 MG suppository Place 25 mg rectally every 6 (six) hours as needed for nausea or vomiting.    [provider]  tiZANidine (ZANAFLEX) 4 MG tablet Take  4 mg by mouth every 6 (six) hours as needed for muscle spasms.     [provider]  zolpidem (AMBIEN) 10 MG tablet Take 10 mg by mouth at bedtime as needed for sleep.    [provider]    Family History Family History  Problem Relation Age of Onset  . Depression Paternal Aunt   . Depression Paternal Uncle   . Depression Maternal Grandfather   . Ovarian cancer Maternal Grandfather   . Uterine cancer Mother   . Breast cancer Mother   . Lung cancer Father        x3  . Colon cancer Father   . Heart attack Father   . Hypertension Father   . Hyperlipidemia Father   . Leukemia Paternal Grandmother     Social History Social History   Tobacco Use  . Smoking status: Never Smoker  . Smokeless tobacco: Never Used  Substance Use Topics  . Alcohol use: Yes    Comment: occ  . Drug use: No  on disability Lives with spouse   Allergies   Baclofen and Lorazepam   Review of Systems Review of Systems  All other systems reviewed and are negative.    Physical Exam Updated Vital Signs BP (!) 135/93   Pulse (!) 102   Temp 98.7 F (37.1 C) (Oral)   Resp 12   Ht  (1.575 m)   Wt 59 kg (130 lb)   LMP 11/01/2013   SpO2 98%   BMI 23.78 kg/m   Physical Exam  Constitutional: She is oriented to person, place, and time. She appears well-developed and well-nourished.  Non-toxic appearance. She does not appear ill. No distress.  HENT:  Head: Normocephalic and  atraumatic.  Right Ear: External ear normal.  Left Ear: External ear normal.  Nose: Nose normal. No mucosal edema or rhinorrhea.  Mouth/Throat: Oropharynx is clear and moist and mucous membranes are normal. No dental abscesses or uvula swelling.  Eyes: Pupils are equal, round, and reactive to light. Conjunctivae and EOM are normal.  Neck: Normal range of motion and full passive range of motion without pain. Neck supple.  Moves her head freely during conversation  Cardiovascular: Normal rate, regular rhythm and normal heart sounds. Exam reveals no gallop and no friction rub.  No murmur heard. Pulmonary/Chest: Effort normal and breath sounds normal. No respiratory distress. She has no wheezes. She has no rhonchi. She has no rales. She exhibits no tenderness and no crepitus.  Abdominal: Soft. Normal appearance and bowel sounds are normal. She exhibits no distension. There is no tenderness. There is no rebound and no guarding.  Musculoskeletal: Normal range of motion. She exhibits no edema or tenderness.  Moves all extremities well.   Neurological: She is alert and oriented to person, place, and time. She has normal strength. No cranial nerve deficit.  Skin: Skin is warm, dry and intact. No rash noted. No erythema. No pallor.  Psychiatric: She has a normal mood and affect. Her speech is normal and behavior is normal. Her mood appears not anxious.  Nursing note and vitals reviewed.    ED Treatments / Results  Labs (all labs ordered are listed, but only abnormal results are displayed) Labs Reviewed - No data to display  EKG EKG Interpretation  Date/Time:  Monday Mar 13 2018 23:17:09 EDT Ventricular Rate:  112 PR Interval:    QRS Duration: 77 QT Interval:  337 QTC Calculation: 460 R Axis:   62 Text Interpretation:  Sinus  tachycardia Consider right atrial enlargement Since last tracing rate faster 09 Mar 2018 Confirmed by Devoria Albe ([id number]) on 03/13/2018 11:25:24 PM   Radiology No  results found.  Procedures Procedures (including critical care time)  Medications Ordered in ED Medications  sodium chloride 0.9 % bolus 1,000 mL (0 mLs Intravenous Stopped 03/14/18 0200)  sodium chloride 0.9 % bolus 500 mL (0 mLs Intravenous Stopped 03/14/18 0200)  metoCLOPramide (REGLAN) injection 10 mg (10 mg Intravenous Given 03/14/18 0055)  diphenhydrAMINE (BENADRYL) injection 25 mg (25 mg Intravenous Given 03/14/18 0055)  dexamethasone (DECADRON) injection 10 mg (10 mg Intravenous Given 03/14/18 0055)  magnesium sulfate IVPB 2 g 50 mL (0 g Intravenous Stopped 03/14/18 0330)  valproate (DEPACON) 1,000 mg in dextrose 5 % 50 mL IVPB (0 mg Intravenous Stopped 03/14/18 0444)  SUMAtriptan (IMITREX) injection 6 mg (6 mg Subcutaneous Given 03/14/18 0449)     Initial Impression / Assessment and Plan / ED Course  I have reviewed the triage vital signs and the nursing notes.  Pertinent labs & imaging results that were available during my care of the patient were reviewed by me and considered in my medical decision making (see chart for details).     Patient was given IV fluids and migraine cocktail.   Recheck at 2:45 AM patient states she still having headache.  She again wants to talk to me about her ankle and the plate in her ankle.  She also wants to clarify to me that that is not why she came to the ED she came about her headache.  She now states Imitrex works well for headache however she did not have any.  I had just ordered her of the magnesium and Depacon.  I will see if we have Imitrex available.  Per nursing staff patient related to them that the fentanyl she was given a few nights ago worked "really well".  4:35 AM nurse reports patient states now her headache is "10 times worse".  She was given Imitrex subcu.  5:45 AM patient is not in the room.  Her IV bags in the trash can.   Review of the [place] Data Base shows she gets #30 diazepam 5 mg tablets monthly, last filled April 10, #30  Ambien 10 mg tablets filled monthly, last filled April 29.  These are written from 2 different providers in [facility]  Final Clinical Impressions(s) / ED Diagnoses   Final diagnoses:  Bad headache    Pt left AMA   Devoria Albe, MD 03/14/18 [phone]

## 2018-03-14 NOTE — ED Notes (Signed)
Called to lobby by registration for unconscious pt. Found pt in wheelchair not responding to verbal commands. Pt's husband stated that pt was having a seizure and that she had had multiple seizures today. When we reached nurses station, pt had what appeared to be a tonic-clonic seizure. Continued to wheel pt to assigned room. Pt was placed in bed and told that we had to get her shirt off to hook her up tom our monitor. Pt acknowledged this with a verbal reply and when asked if she could sit up to help [place] remove her clothing; pt did so without the need for any assistance. Pt had another episode of tonic-clonic appearing seizure-like activity and when finished, was able to follow commands and verbalize appropriately.

## 2018-03-14 NOTE — ED Notes (Signed)
Walked past room to supply room, pt not in room, gown on counter, pt and spouse not on unit, pt left AMA, no IV in trash can in room or bathroom, call placed to [facility] notified pt left with IV in, they will send officer for welfare check

## 2018-03-28 ENCOUNTER — Ambulatory Visit (INDEPENDENT_AMBULATORY_CARE_PROVIDER_SITE_OTHER): Payer: Self-pay | Admitting: Internal Medicine

## 2018-03-28 DIAGNOSIS — R634 Abnormal weight loss: Secondary | ICD-10-CM | POA: Diagnosis not present

## 2018-03-28 DIAGNOSIS — G894 Chronic pain syndrome: Secondary | ICD-10-CM | POA: Diagnosis not present

## 2018-03-28 DIAGNOSIS — G500 Trigeminal neuralgia: Secondary | ICD-10-CM | POA: Diagnosis not present

## 2018-03-28 DIAGNOSIS — G43711 Chronic migraine without aura, intractable, with status migrainosus: Secondary | ICD-10-CM | POA: Diagnosis not present

## 2018-05-23 DIAGNOSIS — Z6826 Body mass index (BMI) 26.0-26.9, adult: Secondary | ICD-10-CM | POA: Diagnosis not present

## 2018-05-23 DIAGNOSIS — G40909 Epilepsy, unspecified, not intractable, without status epilepticus: Secondary | ICD-10-CM | POA: Diagnosis not present

## 2018-05-23 DIAGNOSIS — R110 Nausea: Secondary | ICD-10-CM | POA: Diagnosis not present

## 2018-05-23 DIAGNOSIS — R1032 Left lower quadrant pain: Secondary | ICD-10-CM | POA: Diagnosis not present

## 2018-05-23 DIAGNOSIS — R768 Other specified abnormal immunological findings in serum: Secondary | ICD-10-CM | POA: Diagnosis not present

## 2018-05-23 DIAGNOSIS — K121 Other forms of stomatitis: Secondary | ICD-10-CM | POA: Diagnosis not present

## 2018-05-23 DIAGNOSIS — E663 Overweight: Secondary | ICD-10-CM | POA: Diagnosis not present

## 2018-05-23 DIAGNOSIS — R5383 Other fatigue: Secondary | ICD-10-CM | POA: Diagnosis not present

## 2018-06-05 ENCOUNTER — Ambulatory Visit (INDEPENDENT_AMBULATORY_CARE_PROVIDER_SITE_OTHER): Payer: Medicare Other | Admitting: Internal Medicine

## 2018-06-06 ENCOUNTER — Ambulatory Visit (INDEPENDENT_AMBULATORY_CARE_PROVIDER_SITE_OTHER): Payer: Self-pay | Admitting: Internal Medicine

## 2018-06-06 ENCOUNTER — Encounter (INDEPENDENT_AMBULATORY_CARE_PROVIDER_SITE_OTHER): Payer: Self-pay | Admitting: Internal Medicine

## 2018-06-06 ENCOUNTER — Ambulatory Visit (INDEPENDENT_AMBULATORY_CARE_PROVIDER_SITE_OTHER): Payer: Medicare Other | Admitting: Internal Medicine

## 2018-06-06 DIAGNOSIS — R112 Nausea with vomiting, unspecified: Secondary | ICD-10-CM

## 2018-06-06 DIAGNOSIS — R634 Abnormal weight loss: Secondary | ICD-10-CM

## 2018-06-06 DIAGNOSIS — K582 Mixed irritable bowel syndrome: Secondary | ICD-10-CM

## 2018-06-06 MED ORDER — LOPERAMIDE HCL 2 MG PO TABS: 1.0000 mg | ORAL_TABLET | Freq: Two times a day (BID) | ORAL | 0 refills | Status: AC | PRN

## 2018-06-06 NOTE — Progress Notes (Signed)
Presenting complaint;  Follow-up for nausea vomiting weight loss and irregular bowel movements.  Database and subjective:  Patient is a 47 year old Caucasian female who is here for scheduled visit accompanied by her significant other gland.  She was last seen in March 2019. She is starting to feel better.  She has gained 9 pounds since her last visit. Since her last visit she has been seen seen by Dr. Zenovia Jordan because of positive ANA and she has undergone lab studies but results are not known.  She also had EEG at [facility] which was abnormal and and she was begun on Depakote.  She has follow-up visit with her neurologist. She states while at the [place] she was also treated for fecal impaction and required soapsuds enemas. She is having some better days.  She is able to eat on some days.  She remains with nausea and vomiting.  She generally has an episode once a week when she may have several episodes of vomiting.  Phenergan suppository seems to help.  She has not experienced hematemesis or melena.  She remains with irregular bowel movements.  She either has diarrhea and/or constipation.  When she is constipated she may go 1 week without a bowel movement and when she has diarrhea she may have several stools in a day.  She is trying to eat more salads.  She is taking Dulcolax on as-needed basis.  Current Medications: Outpatient Encounter Medications as of 06/06/2018  Medication Sig  . atorvastatin (LIPITOR) 80 MG tablet Take 80 mg by mouth daily.  . bisacodyl (DULCOLAX) 10 MG suppository Place 1 suppository (10 mg total) rectally daily as needed for moderate constipation.  . cholecalciferol (VITAMIN D) 1000 units tablet Take 1,000 Units by mouth 4 (four) times daily.  . diazepam (VALIUM) 5 MG tablet Take 5 mg by mouth every 6 (six) hours as needed for anxiety.   . divalproex (DEPAKOTE) 250 MG DR tablet Take 250 mg by mouth 2 (two) times daily.  Marland Kitchen gabapentin (NEURONTIN) 400 MG capsule Take 400 mg by  mouth 3 (three) times daily.  Marland Kitchen lidocaine (XYLOCAINE) 2 % jelly Apply 1 application topically as needed (pain).   Marland Kitchen lisinopril-hydrochlorothiazide (PRINZIDE,ZESTORETIC) 20-25 MG tablet Take 1 tablet by mouth daily.  . ondansetron (ZOFRAN ODT) 8 MG disintegrating tablet Take 1 tablet (8 mg total) by mouth every 8 (eight) hours as needed.  . polyvinyl alcohol-povidone (REFRESH) 1.4-0.6 % ophthalmic solution Apply 1-2 drops to eye daily.   . promethazine (PHENERGAN) 25 MG suppository Place 25 mg rectally every 6 (six) hours as needed for nausea or vomiting.  Marland Kitchen tiZANidine (ZANAFLEX) 4 MG tablet Take 4 mg by mouth every 6 (six) hours as needed for muscle spasms.   Marland Kitchen zolpidem (AMBIEN) 10 MG tablet Take 10 mg by mouth at bedtime as needed for sleep.  . [DISCONTINUED] magnesium hydroxide (MILK OF MAGNESIA) 400 MG/5ML suspension Take 15 mLs by mouth daily as needed for mild constipation.  . [DISCONTINUED] magnesium oxide (MAG-OX) 400 MG tablet Take 400 mg by mouth daily.   No facility-administered encounter medications on file as of 06/06/2018.      Objective: Blood pressure 94/62, pulse 94, temperature 98.3 F (36.8 C), temperature source Oral, resp. rate 18, height 5\' 2"  (1.575 m), weight 143 lb 12.8 oz (65.2 kg), last menstrual period 11/01/2013. Patient is alert and in no acute distress. Conjunctiva is pink. Sclera is nonicteric Oropharyngeal mucosa is normal. No neck masses or thyromegaly noted. Cardiac exam with regular  rhythm normal S1 and S2. No murmur or gallop noted. Lungs are clear to auscultation. Abdomenis symmetrical soft and nontender with organomegaly or masses. No LE edema or clubbing noted.   Assessment:  #1.  Weight loss.  Finally her weight loss is reversed and she has gained 9 pounds.  Weight loss clearly appears to be due to loss of appetite.  Anorexia has improved lately.  Loss of appetite possibly related to her medications.  Testing for Addison's disease has been  negative.  #2.  Intermittent nausea and vomiting.  Work-up has been negative.  Once again I wonder if this is related to her medications are somewhat related to her seizure disorder.  Now that she is on new anticonvulsant it remains to be seen of the spells will cease to occur. She has a history of peptic ulcer disease but EGD in March 2018 was negative for pyloric stenosis or active peptic ulcer disease.  #3.  Irritable bowel syndrome.  She has both diarrhea and constipation but more constipation and diarrhea.  She must make sure that she has at least 3 bowel movements a week.   Plan:  Patient advised to use Dulcolax suppository or fleets enema if she goes 2 days without a bowel movement when she has moments when she has an urge and unable to have a bowel movement.. She can use loperamide 2 to 4 mg on her diarrhea days but watch out for impaction. patient will call if nausea and vomiting frequency increases. Office visit in 6 months.

## 2018-06-06 NOTE — Patient Instructions (Signed)
Use Dulcolax suppository or fleets enema if you go more than 2 days without a bowel movement. Use Imodium OTC 2 mg once or twice daily as needed when he have bouts with diarrhea.

## 2018-07-16 DIAGNOSIS — G40409 Other generalized epilepsy and epileptic syndromes, not intractable, without status epilepticus: Secondary | ICD-10-CM | POA: Diagnosis not present

## 2018-07-17 IMAGING — RF DG C-ARM 61-120 MIN
1 series · 12 of 12 positions shown · non-contrast
Comparison: 05/26/2016

FLUOROSCOPY TIME:  0 minutes 29 seconds

Images obtained: 12

CLINICAL DATA: ORIF RIGHT ankle fracture

EXAM:
DG C-ARM 61-120 MIN; RIGHT ANKLE - COMPLETE 3+ VIEW

[Series 1: run · 12 of 12 slices shown]
[im 1/12]
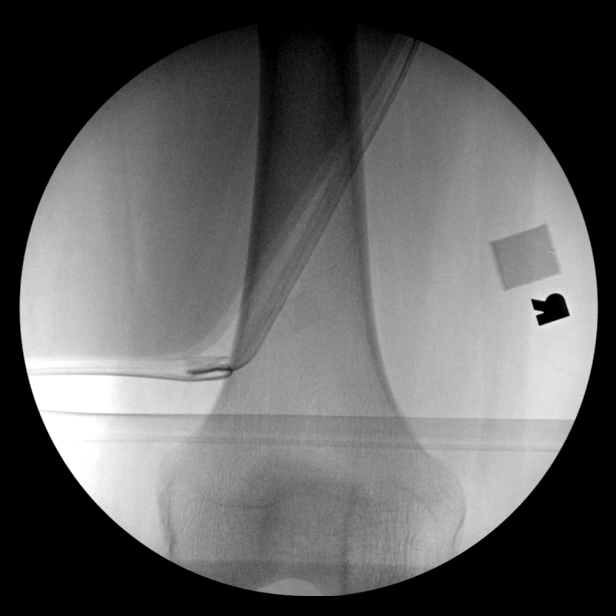
[im 2/12]
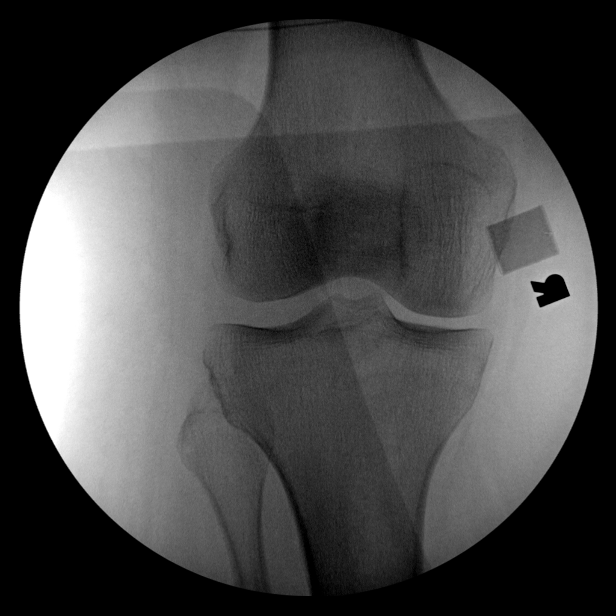
[im 3/12]
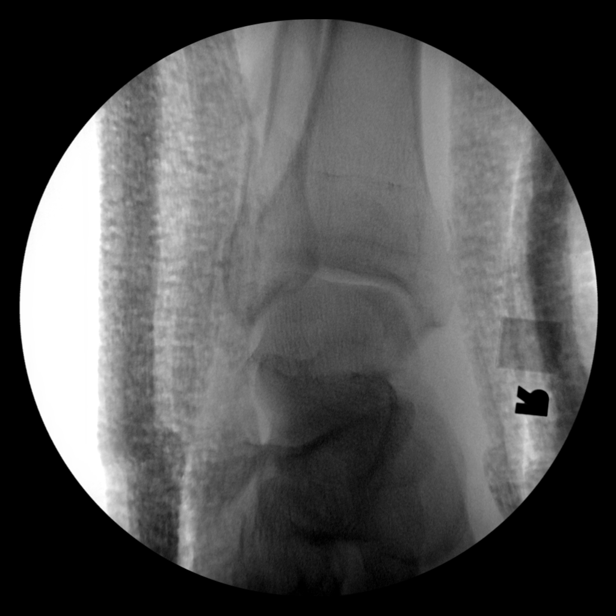
[im 4/12]
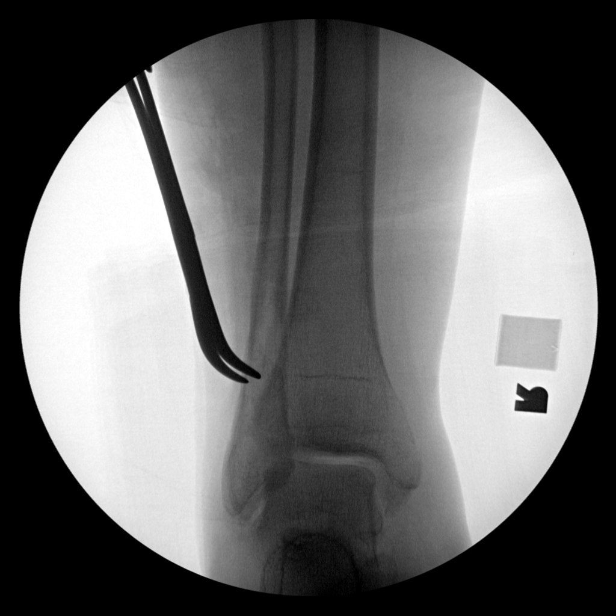
[im 5/12]
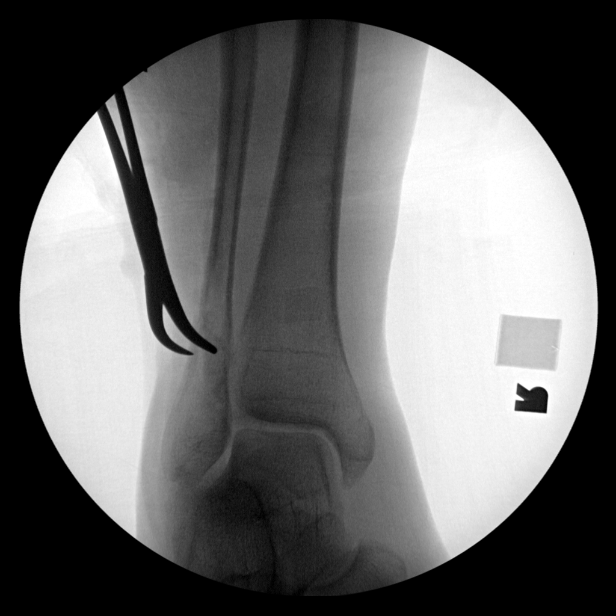
[im 6/12]
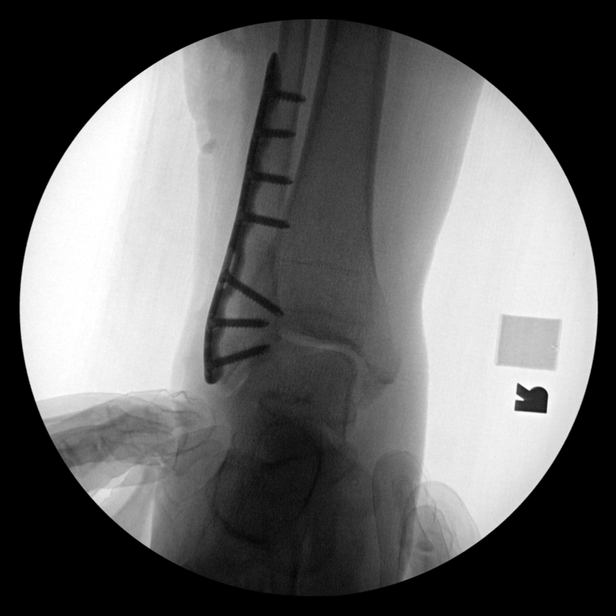
[im 7/12]
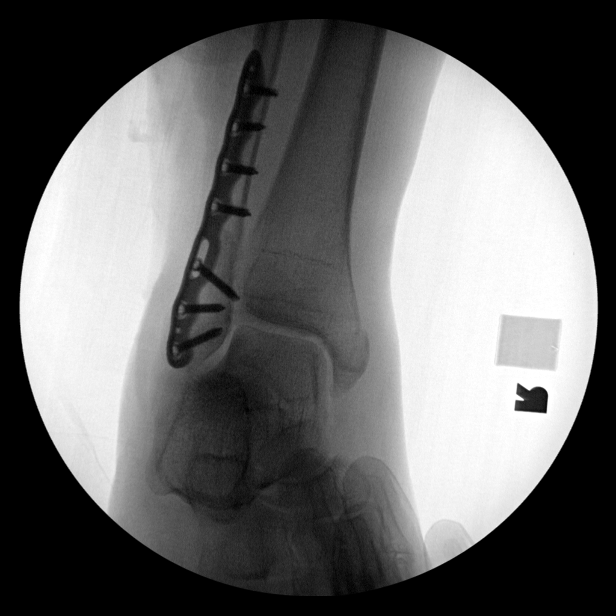
[im 8/12]
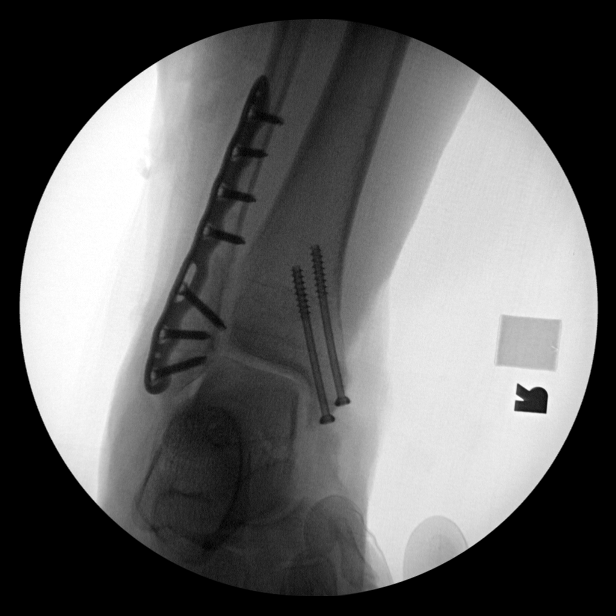
[im 9/12]
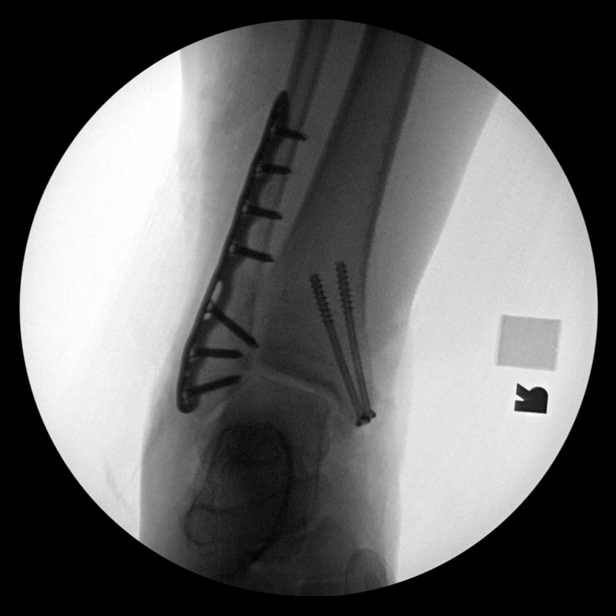
[im 10/12]
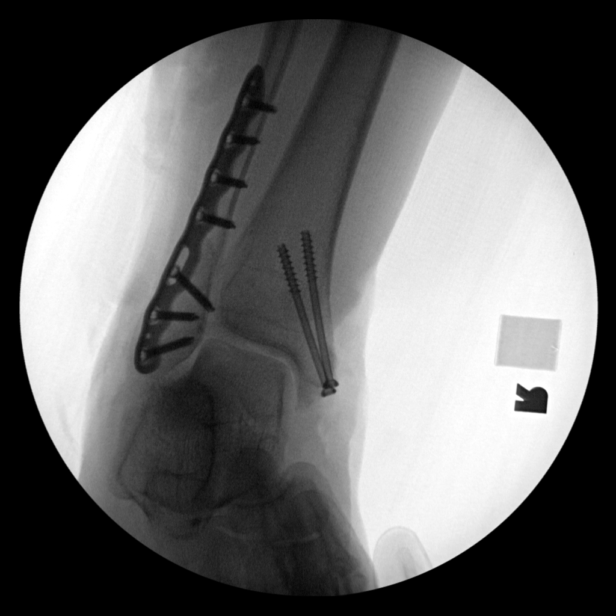
[im 11/12]
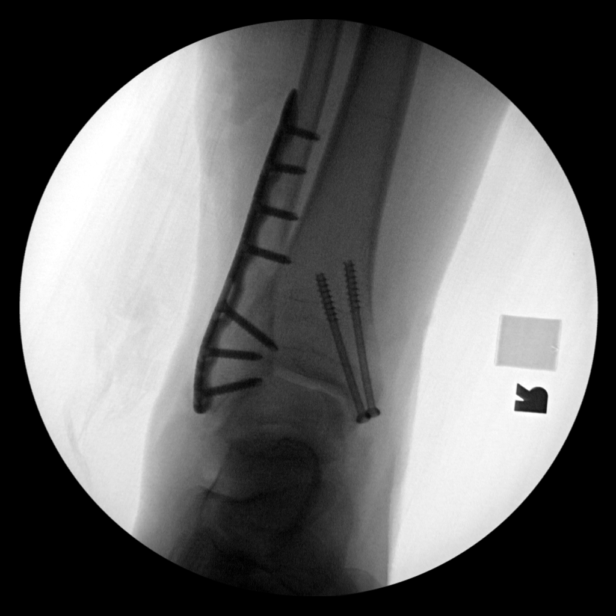
[im 12/12]
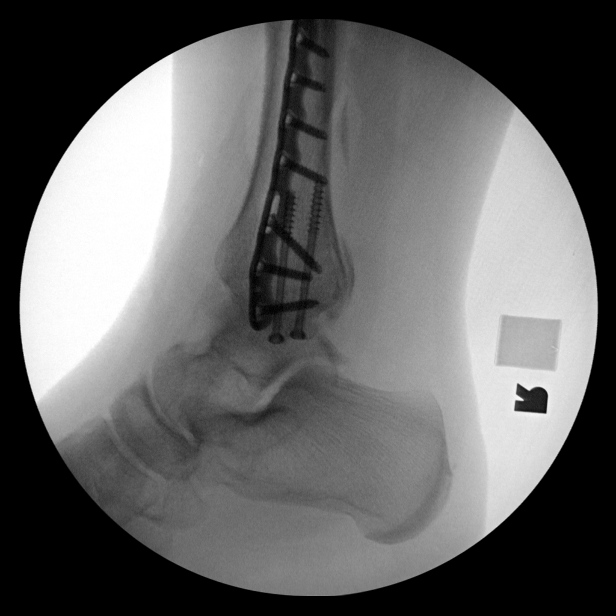

[12 of 12 positions shown; findings below may reference images not displayed]

FINDINGS: Bones demineralized.

Placement of a lateral plate and multiple screws across a reduced
fracture of the distal RIGHT fibula.

Ankle mortise intact.

Two cannulated screws placed across a reduced fractured of the
medial malleolus.

Slightly displaced posterior malleolar fracture fragment noted.
IMPRESSION: Post ORIF of previously identified medial malleolar and distal
fibular fractures RIGHT ankle.

## 2018-08-14 ENCOUNTER — Telehealth: Payer: Self-pay | Admitting: Orthopedic Surgery

## 2018-08-14 NOTE — Telephone Encounter (Signed)
Call from patient/returned call. Actually called for another patient. Separate note entered.

## 2018-08-17 ENCOUNTER — Other Ambulatory Visit: Payer: Self-pay

## 2018-08-17 ENCOUNTER — Encounter (HOSPITAL_COMMUNITY): Payer: Self-pay | Admitting: *Deleted

## 2018-08-17 ENCOUNTER — Emergency Department (HOSPITAL_COMMUNITY)
Admission: EM | Admit: 2018-08-17 | Discharge: 2018-08-17 | Disposition: A | Discharge status: Home / Self Care | Payer: Medicare Other | Attending: Emergency Medicine | Admitting: Emergency Medicine

## 2018-08-17 DIAGNOSIS — I10 Essential (primary) hypertension: Secondary | ICD-10-CM | POA: Diagnosis not present

## 2018-08-17 DIAGNOSIS — H9202 Otalgia, left ear: Secondary | ICD-10-CM | POA: Diagnosis present

## 2018-08-17 DIAGNOSIS — H60312 Diffuse otitis externa, left ear: Secondary | ICD-10-CM | POA: Diagnosis not present

## 2018-08-17 DIAGNOSIS — H60392 Other infective otitis externa, left ear: Secondary | ICD-10-CM | POA: Diagnosis not present

## 2018-08-17 DIAGNOSIS — Z79899 Other long term (current) drug therapy: Secondary | ICD-10-CM | POA: Insufficient documentation

## 2018-08-17 MED ORDER — NEOMYCIN-POLYMYXIN-HC 3.5-10000-1 OT SUSP: 4.0000 [drp] | Freq: Three times a day (TID) | OTIC | 0 refills | Status: AC

## 2018-08-17 MED ORDER — AMOXICILLIN 500 MG PO CAPS: 500.0000 mg | ORAL_CAPSULE | Freq: Three times a day (TID) | ORAL | 0 refills | Status: AC

## 2018-08-17 NOTE — ED Provider Notes (Signed)
[facility] EMERGENCY DEPARTMENT Provider Note   CSN: [id number] Arrival date & time: 08/17/18  1358     History   Chief Complaint Chief Complaint  Patient presents with  . Otalgia    HPI Mariah Ibarra is a 47 y.o. female.  The history is provided by the patient. No language interpreter was used.  Otalgia  This is a new problem. The current episode started more than 2 days ago. There is pain in the left ear. The problem occurs constantly. The problem has been gradually worsening. There has been no fever. The pain is moderate. Associated symptoms include ear discharge.    Past Medical History:  Diagnosis Date  . Anxiety   . Hypercholesteremia   . Hypertension   . Panic attacks   . PTSD (post-traumatic stress disorder)   . Seizures (HCC)   . Stroke [facility])    Short term memory loss from 3rd brain surgery.  . Trigeminal neuralgia     Patient Active Problem List   Diagnosis Date Noted  . Non-intractable vomiting with nausea 10/19/2017  . Rectal bleeding 10/19/2017  . Symptomatic bradycardia 09/29/2017  . Symptomatic sinus bradycardia 09/27/2017  . Volume depletion 09/27/2017  . Syncope and collapse 09/27/2017  . UTI (urinary tract infection) 09/27/2017  . Left lower quadrant pain 09/27/2017  . Seizure-like activity (HCC)   . Loss of weight 12/23/2016  . Nausea without vomiting 12/23/2016  . Lisfranc dislocation, right, initial encounter 11/04/2016  . Fracture of ankle, trimalleolar, right, closed   . Thrush 12/03/2012  . Otitis externa, left 12/03/2012  . SIADH (syndrome of inappropriate ADH production) (HCC) 12/02/2012  . Hyponatremia 12/01/2012  . Seizure (HCC) 12/01/2012  . High anion gap metabolic acidosis 12/01/2012  . Hypokalemia 12/01/2012  . Depression 12/01/2012  . Anxiety 12/01/2012  . Ataxia 08/24/2012  . Trigeminal neuralgia 08/24/2012  . Hypertension 08/24/2012  . Bradycardia 08/24/2012  . Hyperlipidemia 08/24/2012    Past Surgical  History:  Procedure Laterality Date  . ABDOMINAL HYSTERECTOMY    . ABDOMINOPLASTY    . APPENDECTOMY    . Bladder tack    . BRAIN SURGERY     x4  . CERVICAL ABLATION    . COLONOSCOPY WITH PROPOFOL N/A 10/21/2017   Procedure: COLONOSCOPY WITH PROPOFOL;  Surgeon: Malissa Hippo, MD;  Location: [facility] ENDO SUITE;  Service: Endoscopy;  Laterality: N/A;  . ESOPHAGOGASTRODUODENOSCOPY N/A 01/13/2017   Procedure: ESOPHAGOGASTRODUODENOSCOPY (EGD);  Surgeon: Malissa Hippo, MD;  Location: [facility] ENDO SUITE;  Service: Endoscopy;  Laterality: N/A;  3:00  . Gamma knife Procedure November 2013    . ORIF ANKLE FRACTURE Right 06/03/2016   Procedure: OPEN REDUCTION INTERNAL FIXATION (ORIF) RIGHT ANKLE;  Surgeon: Vickki Hearing, MD;  Location: [facility] ORS;  Service: Orthopedics;  Laterality: Right;  . TONSILLECTOMY       OB History    Gravida  0   Para  0   Term  0   Preterm  0   AB  0   Living        SAB  0   TAB  0   Ectopic  0   Multiple      Live Births               Home Medications    Prior to Admission medications   Medication Sig Start Date End Date Taking? Authorizing Provider  amoxicillin (AMOXIL) 500 MG capsule Take 1 capsule (500 mg total) by mouth 3 (three)  times daily. 08/17/18   Elson Areas, PA-C  atorvastatin (LIPITOR) 80 MG tablet Take 80 mg by mouth daily.    [provider]  bisacodyl (DULCOLAX) 10 MG suppository Place 1 suppository (10 mg total) rectally daily as needed for moderate constipation. 10/18/17   Malissa Hippo, MD  cholecalciferol (VITAMIN D) 1000 units tablet Take 1,000 Units by mouth 4 (four) times daily.    [provider]  diazepam (VALIUM) 5 MG tablet Take 5 mg by mouth every 6 (six) hours as needed for anxiety.     [provider]  divalproex (DEPAKOTE) 250 MG DR tablet Take 250 mg by mouth 2 (two) times daily.    [provider]  gabapentin (NEURONTIN) 400 MG capsule Take 400 mg by mouth 3 (three) times  daily.    [provider]  lidocaine (XYLOCAINE) 2 % jelly Apply 1 application topically as needed (pain).     [provider]  lisinopril-hydrochlorothiazide (PRINZIDE,ZESTORETIC) 20-25 MG tablet Take 1 tablet by mouth daily.    [provider]  loperamide (IMODIUM A-D) 2 MG tablet Take 0.5 tablets (1 mg total) by mouth 2 (two) times daily as needed for diarrhea or loose stools. 06/06/18   Rehman, Joline Maxcy, MD  neomycin-polymyxin-hydrocortisone (CORTISPORIN) 3.5-10000-1 OTIC suspension Place 4 drops into the right ear 3 (three) times daily. 08/17/18   Elson Areas, PA-C  ondansetron (ZOFRAN ODT) 8 MG disintegrating tablet Take 1 tablet (8 mg total) by mouth every 8 (eight) hours as needed. 03/09/18   Zadie Rhine, MD  polyvinyl alcohol-povidone (REFRESH) 1.4-0.6 % ophthalmic solution Apply 1-2 drops to eye daily.     [provider]  promethazine (PHENERGAN) 25 MG suppository Place 25 mg rectally every 6 (six) hours as needed for nausea or vomiting.    [provider]  SUMAtriptan (IMITREX) 100 MG tablet Take 100 mg by mouth every 2 (two) hours as needed for migraine. May repeat in 2 hours if headache persists or recurs.    [provider]  tiZANidine (ZANAFLEX) 4 MG tablet Take 4 mg by mouth every 6 (six) hours as needed for muscle spasms.     [provider]  zolpidem (AMBIEN) 10 MG tablet Take 10 mg by mouth at bedtime as needed for sleep.    [provider]    Family History Family History  Problem Relation Age of Onset  . Depression Paternal Aunt   . Depression Paternal Uncle   . Depression Maternal Grandfather   . Ovarian cancer Maternal Grandfather   . Uterine cancer Mother   . Breast cancer Mother   . Lung cancer Father        x3  . Colon cancer Father   . Heart attack Father   . Hypertension Father   . Hyperlipidemia Father   . Leukemia Paternal Grandmother     Social History Social History    Tobacco Use  . Smoking status: Never Smoker  . Smokeless tobacco: Never Used  Substance Use Topics  . Alcohol use: Yes    Comment: occ  . Drug use: No     Allergies   Baclofen and Lorazepam   Review of Systems Review of Systems  HENT: Positive for ear discharge and ear pain.   All other systems reviewed and are negative.    Physical Exam Updated Vital Signs BP 97/67   Pulse 87   Temp 98.3 F (36.8 C) (Oral)   Resp 18   Ht 5'  2" (1.575 m)   Wt 68 kg   LMP 11/01/2013   SpO2 97%   BMI 27.44 kg/m   Physical Exam  Constitutional: She appears well-developed and well-nourished.  HENT:  Head: Normocephalic.  Right Ear: External ear normal.  Left canal swollen,  I can not visualize tm.  Eyes: Pupils are equal, round, and reactive to light. EOM are normal.  Neck: Normal range of motion.  Cardiovascular: Normal rate.  Pulmonary/Chest: Effort normal.  Abdominal: Soft.  Musculoskeletal: Normal range of motion.  Neurological: She is alert.  Skin: Skin is warm.  Psychiatric: She has a normal mood and affect.  Nursing note and vitals reviewed.    ED Treatments / Results  Labs (all labs ordered are listed, but only abnormal results are displayed) Labs Reviewed - No data to display  EKG None  Radiology No results found.  Procedures Procedures (including critical care time)  Medications Ordered in ED Medications - No data to display   Initial Impression / Assessment and Plan / ED Course  I have reviewed the triage vital signs and the nursing notes.  Pertinent labs & imaging results that were available during my care of the patient were reviewed by me and considered in my medical decision making (see chart for details).     MDM  Pt given rx for cortisporin.  I will cover with amoxicillin as well.  Pt advised to schedule recheck of ear in 1 week  Final Clinical Impressions(s) / ED Diagnoses   Final diagnoses:  Acute diffuse otitis externa of left ear     ED Discharge Orders         Ordered    neomycin-polymyxin-hydrocortisone (CORTISPORIN) 3.5-10000-1 OTIC suspension  3 times daily     08/17/18 1706    amoxicillin (AMOXIL) 500 MG capsule  3 times daily     08/17/18 1706        An After Visit Summary was printed and given to the patient.    Elson Areas, [place] 08/17/18 1710    Maia Plan, MD 08/18/18 1021

## 2018-08-17 NOTE — Discharge Instructions (Signed)
See your Physician for recheck in 1 week  °

## 2018-08-17 NOTE — ED Triage Notes (Signed)
Left ear ache

## 2018-11-21 DIAGNOSIS — R109 Unspecified abdominal pain: Secondary | ICD-10-CM | POA: Diagnosis not present

## 2018-11-21 DIAGNOSIS — N281 Cyst of kidney, acquired: Secondary | ICD-10-CM | POA: Diagnosis not present

## 2018-11-21 DIAGNOSIS — R569 Unspecified convulsions: Secondary | ICD-10-CM | POA: Diagnosis not present

## 2018-11-21 DIAGNOSIS — I10 Essential (primary) hypertension: Secondary | ICD-10-CM | POA: Diagnosis not present

## 2018-11-21 DIAGNOSIS — Z8719 Personal history of other diseases of the digestive system: Secondary | ICD-10-CM | POA: Diagnosis not present

## 2018-11-21 DIAGNOSIS — R1084 Generalized abdominal pain: Secondary | ICD-10-CM | POA: Diagnosis not present

## 2018-11-23 ENCOUNTER — Telehealth (INDEPENDENT_AMBULATORY_CARE_PROVIDER_SITE_OTHER): Payer: Self-pay | Admitting: Internal Medicine

## 2018-11-23 NOTE — Telephone Encounter (Signed)
Patient called stated she is in [place], [place] - stated she had an epileptic seizure and was sent to a hospital in [place] they did a CT and found she has colitis  then transferred to [facility] put her in the hallway gave her meds and sent her out-patient states she is still in a lot of pain - would like a call from Dr Karilyn Cota ph# [phone]

## 2018-11-24 NOTE — Telephone Encounter (Signed)
Patient will be called on Monday, 11/27/2018.

## 2018-11-25 IMAGING — DX DG TIBIA/FIBULA 2V*R*
3 series · 3 of 3 positions shown · non-contrast
Comparison: 07/30/2016

CLINICAL DATA: Fell wall having seizure today pain in the mid to
right lower leg with radiation to the ankle

EXAM:
RIGHT TIBIA AND FIBULA - 2 VIEW

[tibia ap]
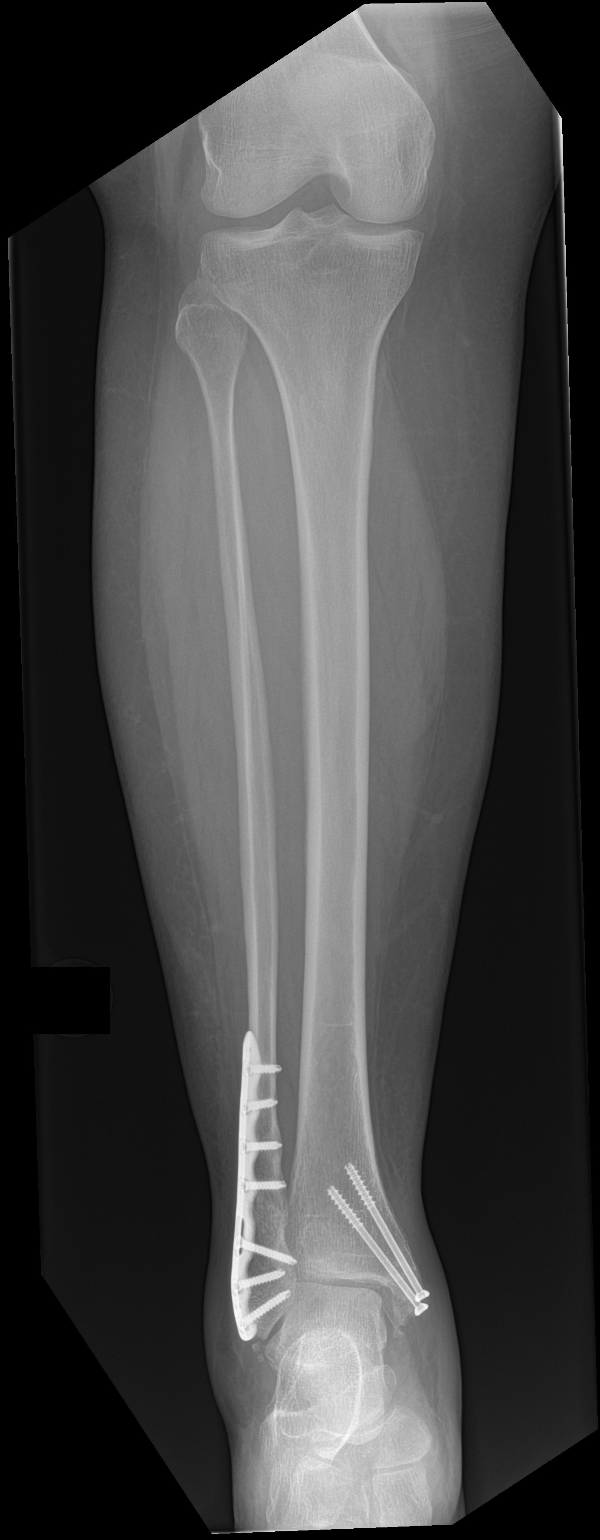

[tibia lat (1 of 2)]
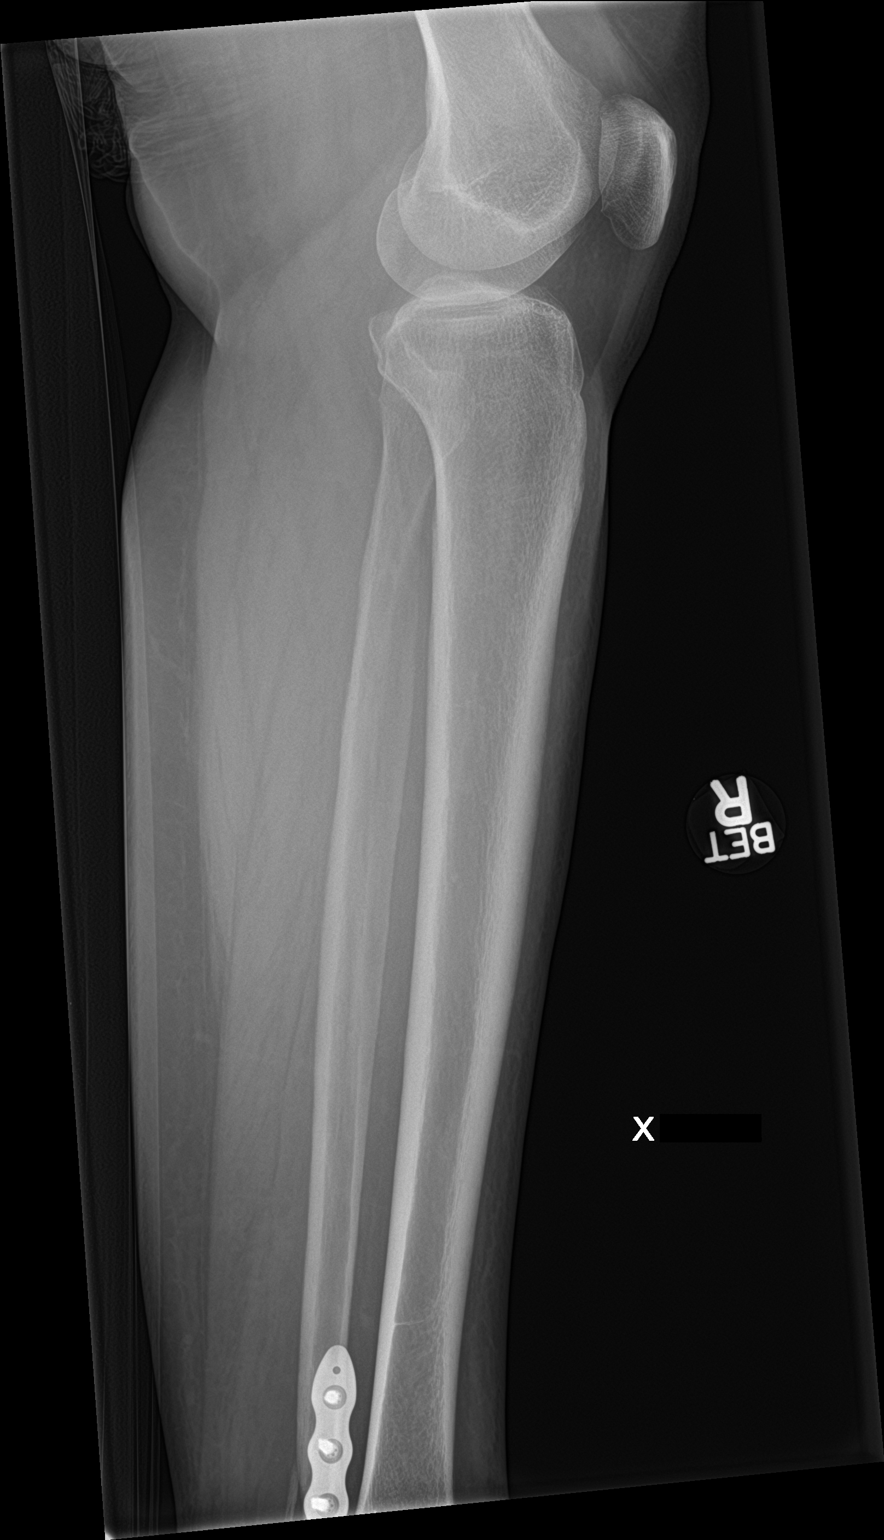

[tibia lat (2 of 2)]
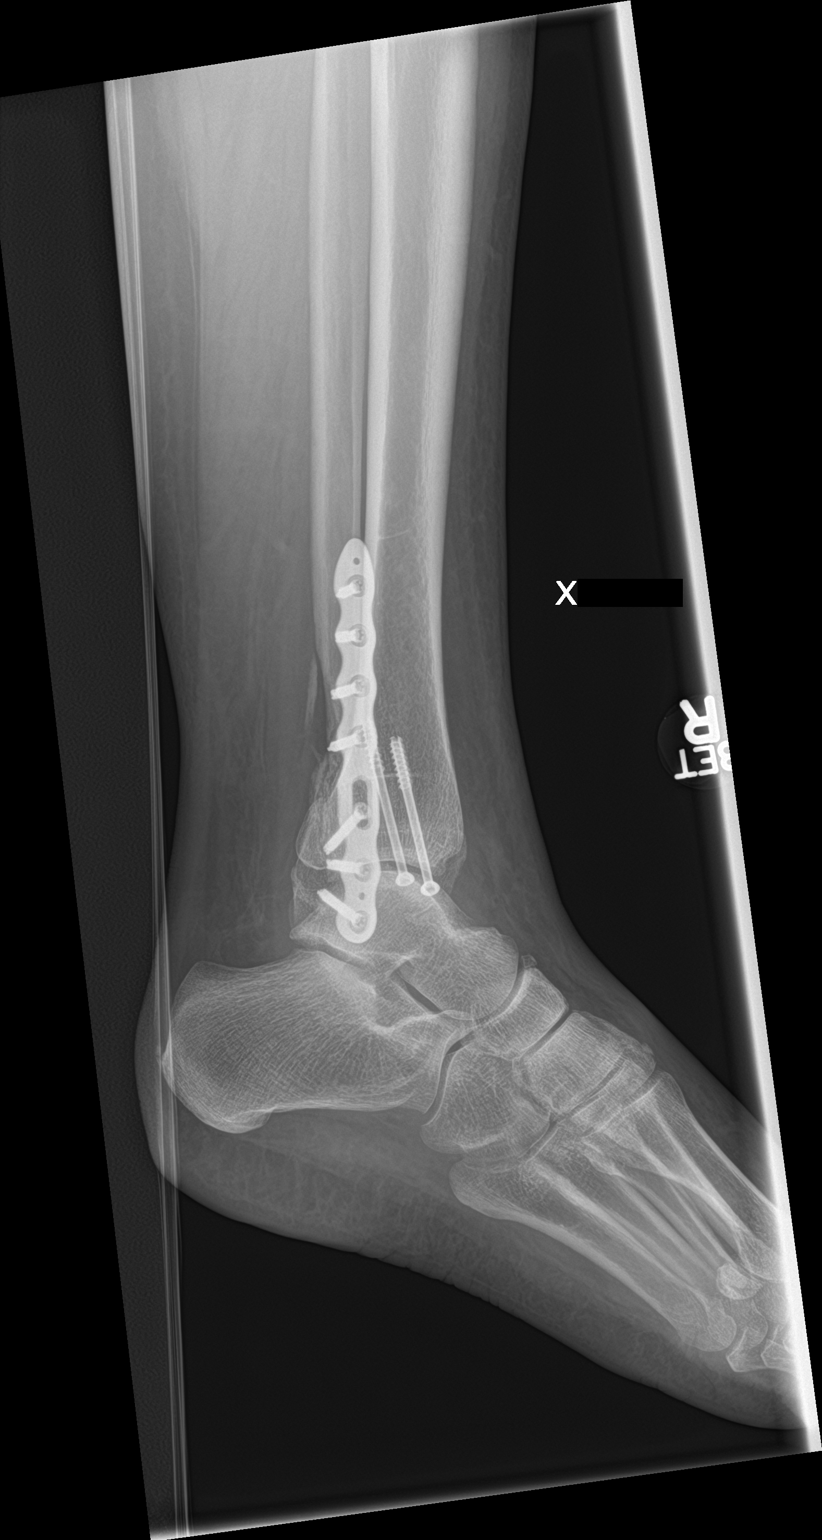

[3 of 3 positions shown; findings below may reference images not displayed]

FINDINGS: No acute fracture or malalignments. Stable surgical plate and screw
fixation of the distal fibula and screw fixation of the distal
tibia. Old avulsion injuries off the medial and lateral malleoli.
Soft tissues are unremarkable.
IMPRESSION: Postsurgical changes of the distal fibula and tibia. No acute
osseous abnormality.

## 2018-11-25 IMAGING — DX DG FOOT COMPLETE 3+V*R*
3 series · 3 of 3 positions shown · non-contrast
Comparison: None.

CLINICAL DATA: Right foot pain after seizure today.

EXAM:
RIGHT FOOT COMPLETE - 3+ VIEW

[foot ap]
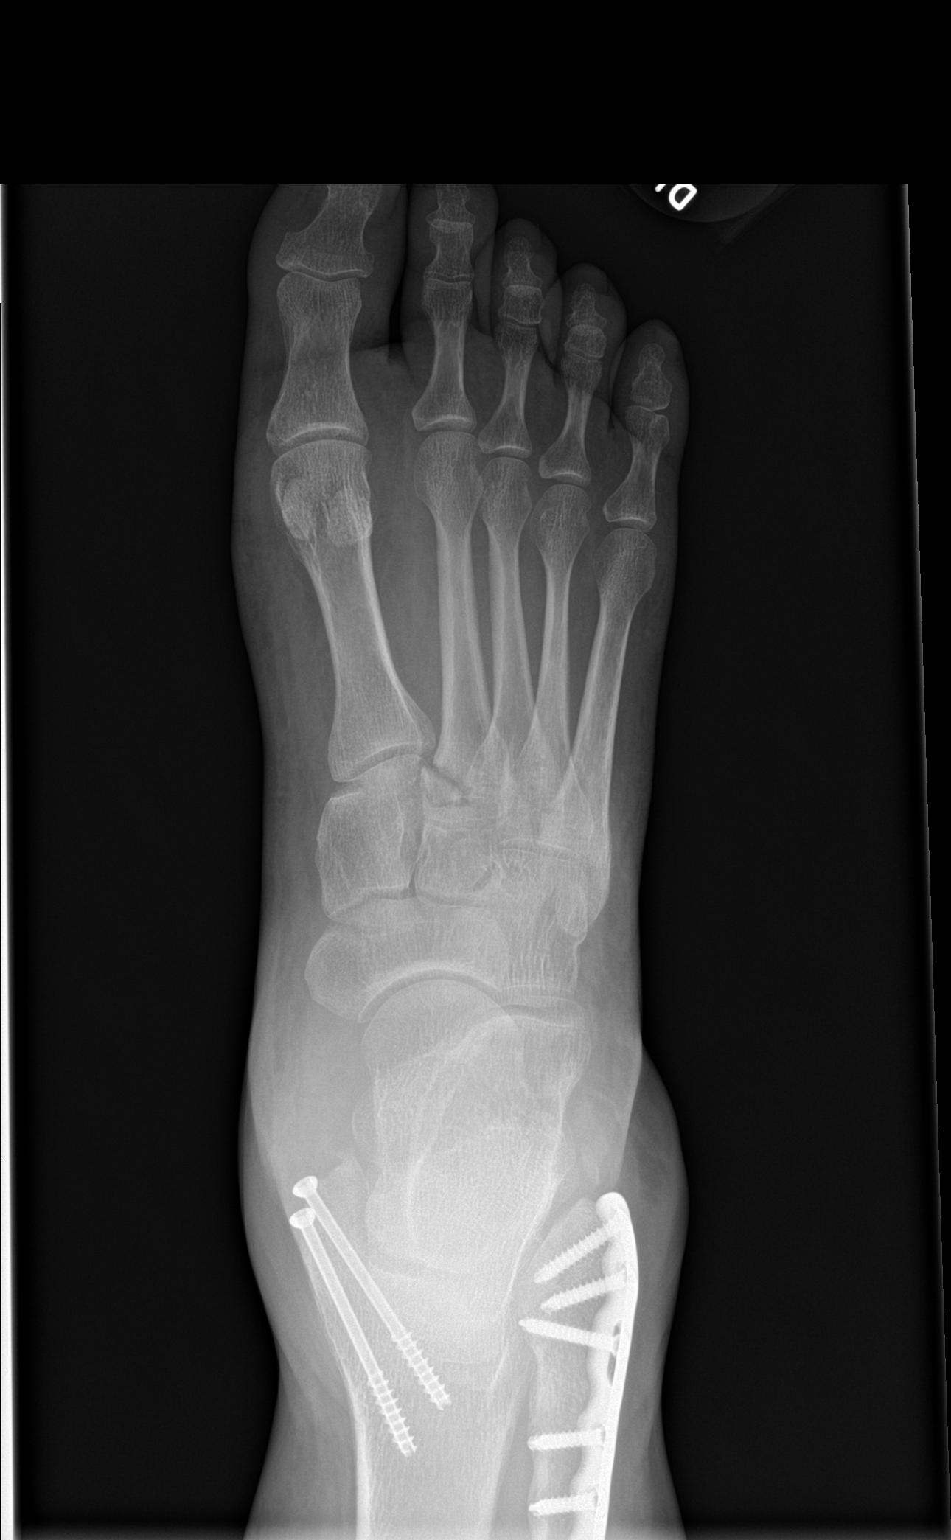

[foot obl]
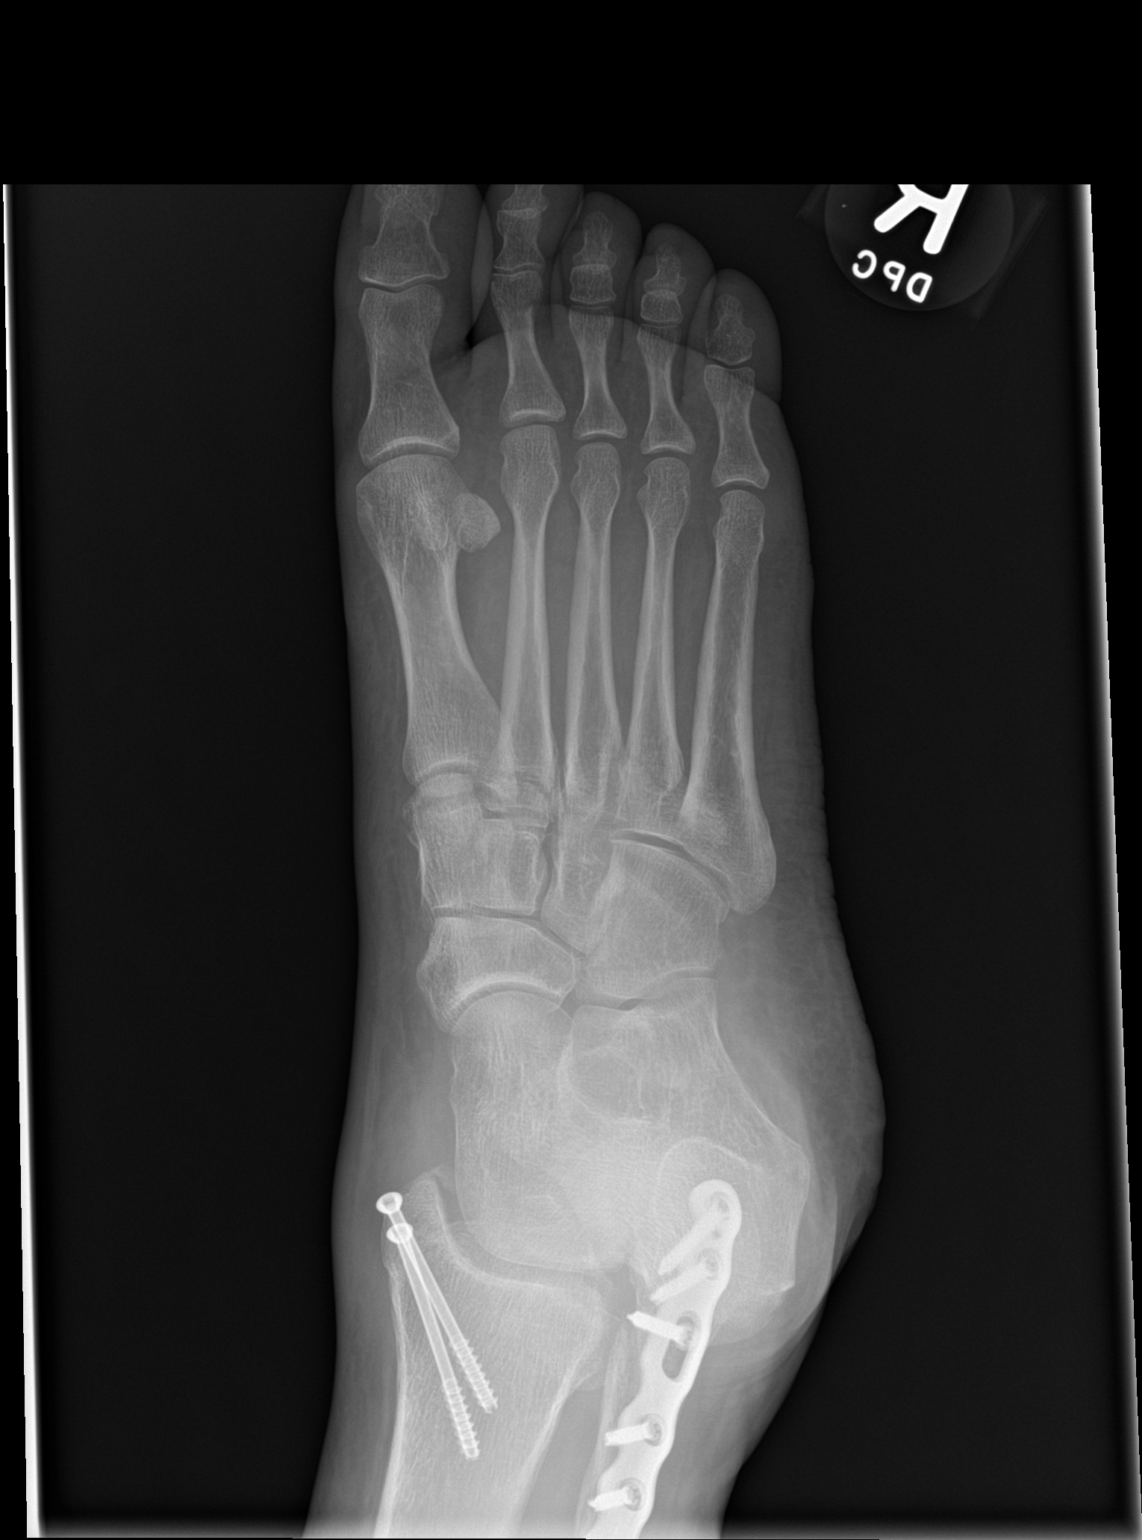

[foot lat]
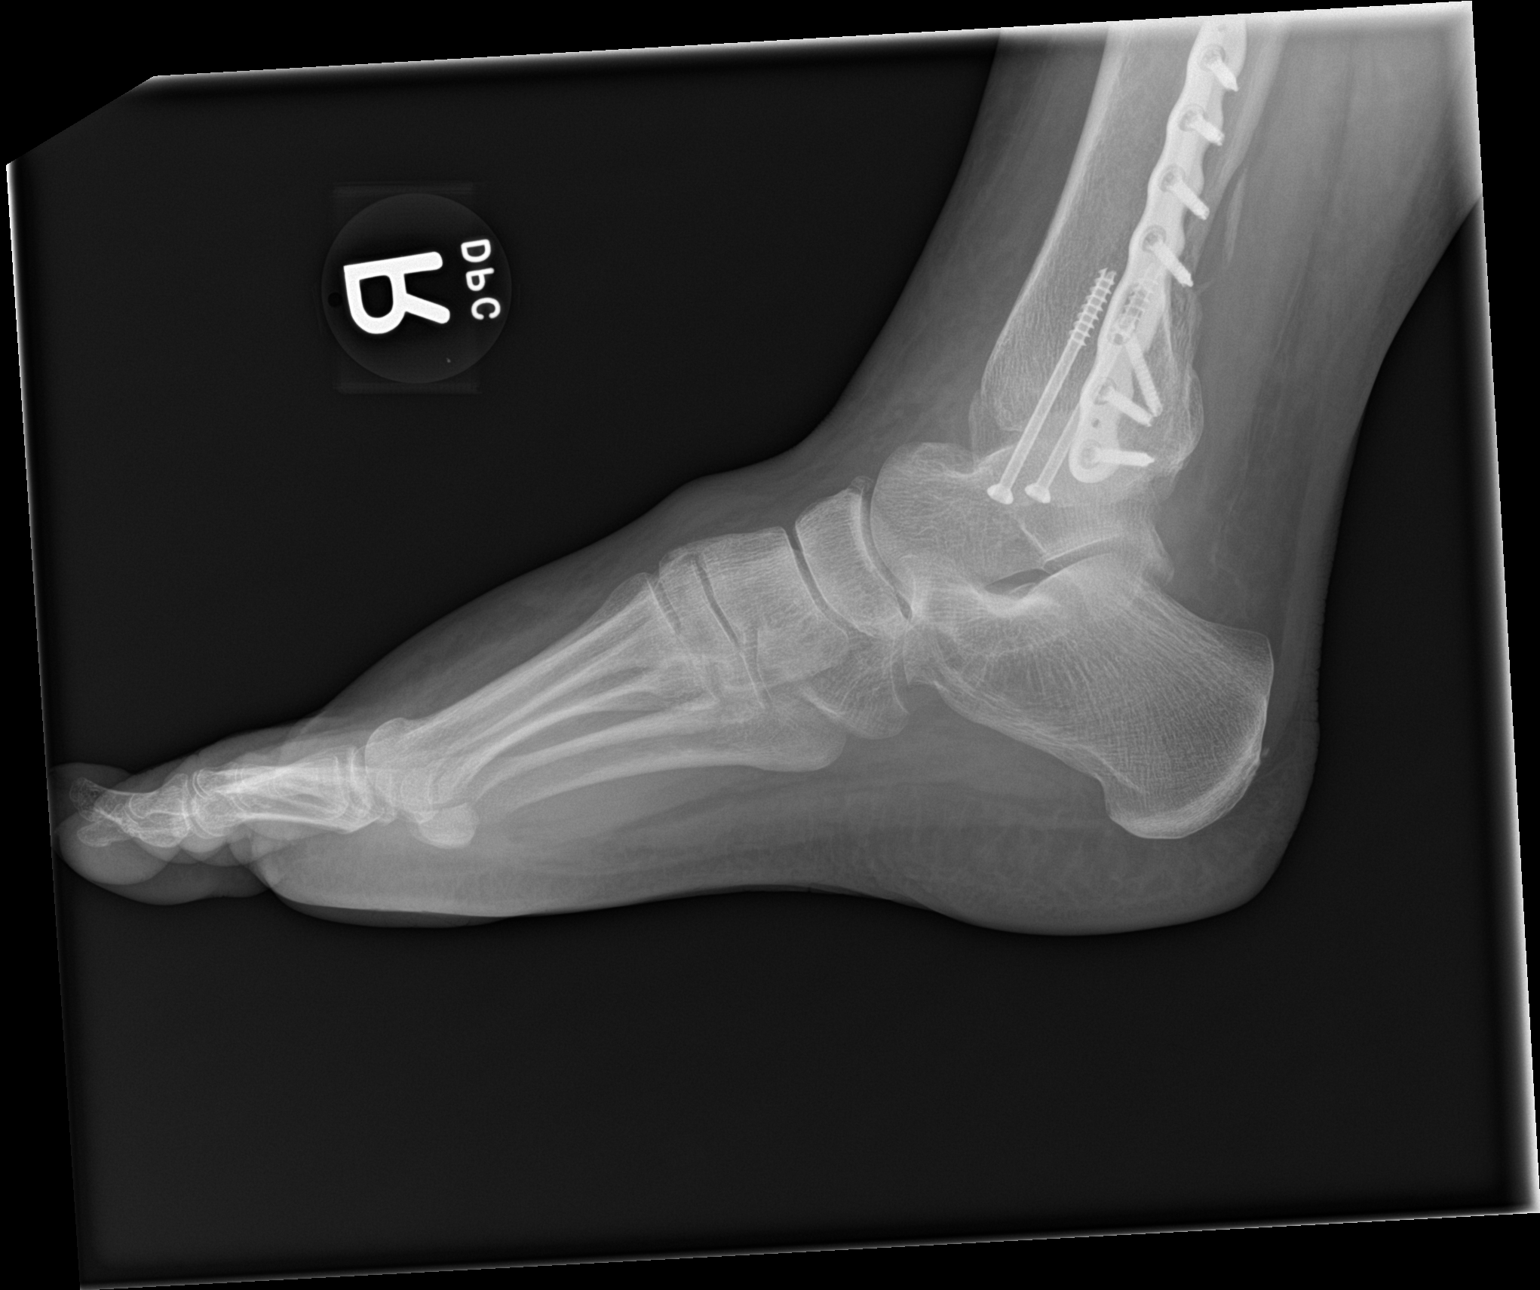

[3 of 3 positions shown; findings below may reference images not displayed]

FINDINGS: Mildly displaced fracture is seen involving the proximal base of the
second metatarsal. Intra-articular extension is noted. No soft
tissue abnormality is noted.
IMPRESSION: Mildly displaced fracture involving proximal base of second
metatarsal with intra-articular extension.

## 2018-11-27 NOTE — Telephone Encounter (Signed)
Patient's call returned. She says she went to the hospital for seizure activity.  She was transferred from a small hospital to [facility] where she had CT and she was told she had colitis.  She has been passing clots ever since but she was discharged on amoxicillin. Patient advised to come to emergency room for further evaluation.

## 2018-11-27 NOTE — Telephone Encounter (Signed)
Noted  

## 2018-12-12 ENCOUNTER — Ambulatory Visit (INDEPENDENT_AMBULATORY_CARE_PROVIDER_SITE_OTHER): Payer: Self-pay | Admitting: Internal Medicine

## 2019-05-30 DIAGNOSIS — R569 Unspecified convulsions: Secondary | ICD-10-CM | POA: Diagnosis not present

## 2019-05-30 DIAGNOSIS — Z01818 Encounter for other preprocedural examination: Secondary | ICD-10-CM | POA: Diagnosis not present

## 2020-02-25 DIAGNOSIS — H16142 Punctate keratitis, left eye: Secondary | ICD-10-CM | POA: Diagnosis not present

## 2020-04-29 DIAGNOSIS — E876 Hypokalemia: Secondary | ICD-10-CM | POA: Diagnosis not present

## 2020-04-29 DIAGNOSIS — R112 Nausea with vomiting, unspecified: Secondary | ICD-10-CM | POA: Diagnosis not present

## 2020-04-29 DIAGNOSIS — Z79899 Other long term (current) drug therapy: Secondary | ICD-10-CM | POA: Diagnosis not present

## 2020-04-29 DIAGNOSIS — R1110 Vomiting, unspecified: Secondary | ICD-10-CM | POA: Diagnosis not present

## 2020-04-29 DIAGNOSIS — S0990XA Unspecified injury of head, initial encounter: Secondary | ICD-10-CM | POA: Diagnosis not present

## 2020-04-29 DIAGNOSIS — R1084 Generalized abdominal pain: Secondary | ICD-10-CM | POA: Diagnosis not present

## 2020-04-29 DIAGNOSIS — I10 Essential (primary) hypertension: Secondary | ICD-10-CM | POA: Diagnosis not present

## 2020-04-29 DIAGNOSIS — R519 Headache, unspecified: Secondary | ICD-10-CM | POA: Diagnosis not present

## 2020-05-08 DIAGNOSIS — R0789 Other chest pain: Secondary | ICD-10-CM | POA: Diagnosis not present

## 2020-05-08 DIAGNOSIS — I878 Other specified disorders of veins: Secondary | ICD-10-CM | POA: Diagnosis not present

## 2020-05-08 DIAGNOSIS — R569 Unspecified convulsions: Secondary | ICD-10-CM | POA: Diagnosis not present

## 2020-05-08 DIAGNOSIS — A0472 Enterocolitis due to Clostridium difficile, not specified as recurrent: Secondary | ICD-10-CM | POA: Diagnosis not present

## 2020-05-08 DIAGNOSIS — Z9682 Presence of neurostimulator: Secondary | ICD-10-CM | POA: Diagnosis not present

## 2020-05-08 DIAGNOSIS — N281 Cyst of kidney, acquired: Secondary | ICD-10-CM | POA: Diagnosis not present

## 2020-05-08 DIAGNOSIS — R197 Diarrhea, unspecified: Secondary | ICD-10-CM | POA: Diagnosis not present

## 2020-05-08 DIAGNOSIS — R109 Unspecified abdominal pain: Secondary | ICD-10-CM | POA: Diagnosis not present

## 2020-05-08 DIAGNOSIS — R112 Nausea with vomiting, unspecified: Secondary | ICD-10-CM | POA: Diagnosis not present

## 2020-05-08 DIAGNOSIS — R072 Precordial pain: Secondary | ICD-10-CM | POA: Diagnosis not present

## 2020-05-08 DIAGNOSIS — I10 Essential (primary) hypertension: Secondary | ICD-10-CM | POA: Diagnosis not present

## 2020-05-08 DIAGNOSIS — R079 Chest pain, unspecified: Secondary | ICD-10-CM | POA: Diagnosis not present

## 2020-05-08 DIAGNOSIS — Z8249 Family history of ischemic heart disease and other diseases of the circulatory system: Secondary | ICD-10-CM | POA: Diagnosis not present

## 2020-05-08 DIAGNOSIS — G8929 Other chronic pain: Secondary | ICD-10-CM | POA: Diagnosis not present

## 2020-05-08 DIAGNOSIS — I498 Other specified cardiac arrhythmias: Secondary | ICD-10-CM | POA: Diagnosis not present

## 2020-05-23 DIAGNOSIS — Z20822 Contact with and (suspected) exposure to covid-19: Secondary | ICD-10-CM | POA: Diagnosis not present

## 2020-05-23 DIAGNOSIS — Z01812 Encounter for preprocedural laboratory examination: Secondary | ICD-10-CM | POA: Diagnosis not present

## 2020-05-23 DIAGNOSIS — I878 Other specified disorders of veins: Secondary | ICD-10-CM | POA: Diagnosis not present

## 2020-05-26 DIAGNOSIS — I878 Other specified disorders of veins: Secondary | ICD-10-CM | POA: Diagnosis not present

## 2020-05-26 DIAGNOSIS — I10 Essential (primary) hypertension: Secondary | ICD-10-CM | POA: Diagnosis not present

## 2020-05-26 DIAGNOSIS — Z4682 Encounter for fitting and adjustment of non-vascular catheter: Secondary | ICD-10-CM | POA: Diagnosis not present

## 2020-05-26 DIAGNOSIS — Z79899 Other long term (current) drug therapy: Secondary | ICD-10-CM | POA: Diagnosis not present

## 2020-05-26 DIAGNOSIS — Z4689 Encounter for fitting and adjustment of other specified devices: Secondary | ICD-10-CM | POA: Diagnosis not present

## 2020-05-31 DIAGNOSIS — I878 Other specified disorders of veins: Secondary | ICD-10-CM | POA: Diagnosis not present

## 2020-05-31 DIAGNOSIS — I10 Essential (primary) hypertension: Secondary | ICD-10-CM | POA: Diagnosis not present

## 2020-05-31 DIAGNOSIS — Z993 Dependence on wheelchair: Secondary | ICD-10-CM | POA: Diagnosis not present

## 2020-05-31 DIAGNOSIS — Z9181 History of falling: Secondary | ICD-10-CM | POA: Diagnosis not present

## 2020-06-03 DIAGNOSIS — I10 Essential (primary) hypertension: Secondary | ICD-10-CM | POA: Diagnosis not present

## 2020-06-03 DIAGNOSIS — I878 Other specified disorders of veins: Secondary | ICD-10-CM | POA: Diagnosis not present

## 2020-06-03 DIAGNOSIS — Z9181 History of falling: Secondary | ICD-10-CM | POA: Diagnosis not present

## 2020-06-03 DIAGNOSIS — Z993 Dependence on wheelchair: Secondary | ICD-10-CM | POA: Diagnosis not present

## 2020-06-10 DIAGNOSIS — Z993 Dependence on wheelchair: Secondary | ICD-10-CM | POA: Diagnosis not present

## 2020-06-10 DIAGNOSIS — I878 Other specified disorders of veins: Secondary | ICD-10-CM | POA: Diagnosis not present

## 2020-06-10 DIAGNOSIS — I10 Essential (primary) hypertension: Secondary | ICD-10-CM | POA: Diagnosis not present

## 2020-06-10 DIAGNOSIS — Z9181 History of falling: Secondary | ICD-10-CM | POA: Diagnosis not present

## 2020-06-14 DIAGNOSIS — R0602 Shortness of breath: Secondary | ICD-10-CM | POA: Diagnosis not present

## 2020-06-14 DIAGNOSIS — R569 Unspecified convulsions: Secondary | ICD-10-CM | POA: Diagnosis not present

## 2020-06-14 DIAGNOSIS — M8788 Other osteonecrosis, other site: Secondary | ICD-10-CM | POA: Diagnosis not present

## 2020-06-14 DIAGNOSIS — R1110 Vomiting, unspecified: Secondary | ICD-10-CM | POA: Diagnosis not present

## 2020-06-14 DIAGNOSIS — Z20822 Contact with and (suspected) exposure to covid-19: Secondary | ICD-10-CM | POA: Diagnosis not present

## 2020-06-14 DIAGNOSIS — I10 Essential (primary) hypertension: Secondary | ICD-10-CM | POA: Diagnosis not present

## 2020-06-14 DIAGNOSIS — M87851 Other osteonecrosis, right femur: Secondary | ICD-10-CM | POA: Diagnosis not present

## 2020-06-14 DIAGNOSIS — Z79899 Other long term (current) drug therapy: Secondary | ICD-10-CM | POA: Diagnosis not present

## 2020-06-14 DIAGNOSIS — M87051 Idiopathic aseptic necrosis of right femur: Secondary | ICD-10-CM | POA: Diagnosis not present

## 2020-06-17 DIAGNOSIS — Z993 Dependence on wheelchair: Secondary | ICD-10-CM | POA: Diagnosis not present

## 2020-06-17 DIAGNOSIS — I878 Other specified disorders of veins: Secondary | ICD-10-CM | POA: Diagnosis not present

## 2020-06-17 DIAGNOSIS — I10 Essential (primary) hypertension: Secondary | ICD-10-CM | POA: Diagnosis not present

## 2020-06-17 DIAGNOSIS — Z9181 History of falling: Secondary | ICD-10-CM | POA: Diagnosis not present

## 2020-06-19 DIAGNOSIS — I878 Other specified disorders of veins: Secondary | ICD-10-CM | POA: Diagnosis not present

## 2020-06-19 DIAGNOSIS — K529 Noninfective gastroenteritis and colitis, unspecified: Secondary | ICD-10-CM | POA: Diagnosis not present

## 2020-07-03 DIAGNOSIS — Z993 Dependence on wheelchair: Secondary | ICD-10-CM | POA: Diagnosis not present

## 2020-07-03 DIAGNOSIS — Z9181 History of falling: Secondary | ICD-10-CM | POA: Diagnosis not present

## 2020-07-03 DIAGNOSIS — I878 Other specified disorders of veins: Secondary | ICD-10-CM | POA: Diagnosis not present

## 2020-07-03 DIAGNOSIS — I10 Essential (primary) hypertension: Secondary | ICD-10-CM | POA: Diagnosis not present

## 2020-07-11 DIAGNOSIS — I10 Essential (primary) hypertension: Secondary | ICD-10-CM | POA: Diagnosis not present

## 2020-07-11 DIAGNOSIS — Z9181 History of falling: Secondary | ICD-10-CM | POA: Diagnosis not present

## 2020-07-11 DIAGNOSIS — Z993 Dependence on wheelchair: Secondary | ICD-10-CM | POA: Diagnosis not present

## 2020-07-11 DIAGNOSIS — I878 Other specified disorders of veins: Secondary | ICD-10-CM | POA: Diagnosis not present

## 2020-08-20 DIAGNOSIS — I878 Other specified disorders of veins: Secondary | ICD-10-CM | POA: Diagnosis not present

## 2020-08-25 DIAGNOSIS — H16142 Punctate keratitis, left eye: Secondary | ICD-10-CM | POA: Diagnosis not present

## 2020-09-18 DIAGNOSIS — H04123 Dry eye syndrome of bilateral lacrimal glands: Secondary | ICD-10-CM | POA: Diagnosis not present

## 2020-11-19 DIAGNOSIS — I878 Other specified disorders of veins: Secondary | ICD-10-CM | POA: Diagnosis not present

## 2021-01-13 DIAGNOSIS — M5412 Radiculopathy, cervical region: Secondary | ICD-10-CM | POA: Diagnosis not present

## 2021-01-13 DIAGNOSIS — M5417 Radiculopathy, lumbosacral region: Secondary | ICD-10-CM | POA: Diagnosis not present

## 2021-01-13 DIAGNOSIS — M4722 Other spondylosis with radiculopathy, cervical region: Secondary | ICD-10-CM | POA: Diagnosis not present

## 2021-01-21 DIAGNOSIS — I878 Other specified disorders of veins: Secondary | ICD-10-CM | POA: Diagnosis not present

## 2021-01-22 DIAGNOSIS — K5900 Constipation, unspecified: Secondary | ICD-10-CM | POA: Diagnosis not present

## 2021-01-22 DIAGNOSIS — N289 Disorder of kidney and ureter, unspecified: Secondary | ICD-10-CM | POA: Diagnosis not present

## 2021-01-22 DIAGNOSIS — N281 Cyst of kidney, acquired: Secondary | ICD-10-CM | POA: Diagnosis not present

## 2021-01-22 DIAGNOSIS — K56600 Partial intestinal obstruction, unspecified as to cause: Secondary | ICD-10-CM | POA: Diagnosis not present

## 2021-01-22 DIAGNOSIS — I159 Secondary hypertension, unspecified: Secondary | ICD-10-CM | POA: Diagnosis not present

## 2021-02-25 DIAGNOSIS — H16232 Neurotrophic keratoconjunctivitis, left eye: Secondary | ICD-10-CM | POA: Diagnosis not present

## 2021-02-25 DIAGNOSIS — H52203 Unspecified astigmatism, bilateral: Secondary | ICD-10-CM | POA: Diagnosis not present

## 2021-02-25 DIAGNOSIS — H04123 Dry eye syndrome of bilateral lacrimal glands: Secondary | ICD-10-CM | POA: Diagnosis not present

## 2021-02-25 DIAGNOSIS — H16142 Punctate keratitis, left eye: Secondary | ICD-10-CM | POA: Diagnosis not present

## 2021-02-25 DIAGNOSIS — H2512 Age-related nuclear cataract, left eye: Secondary | ICD-10-CM | POA: Diagnosis not present

## 2021-02-25 DIAGNOSIS — H5203 Hypermetropia, bilateral: Secondary | ICD-10-CM | POA: Diagnosis not present

## 2021-02-26 DIAGNOSIS — M4310 Spondylolisthesis, site unspecified: Secondary | ICD-10-CM | POA: Diagnosis not present

## 2021-02-26 DIAGNOSIS — R1310 Dysphagia, unspecified: Secondary | ICD-10-CM | POA: Diagnosis not present

## 2021-02-26 DIAGNOSIS — Z9682 Presence of neurostimulator: Secondary | ICD-10-CM | POA: Diagnosis not present

## 2021-03-18 DIAGNOSIS — I878 Other specified disorders of veins: Secondary | ICD-10-CM | POA: Diagnosis not present

## 2021-05-15 DIAGNOSIS — H16232 Neurotrophic keratoconjunctivitis, left eye: Secondary | ICD-10-CM | POA: Diagnosis not present

## 2021-05-15 DIAGNOSIS — H16142 Punctate keratitis, left eye: Secondary | ICD-10-CM | POA: Diagnosis not present

## 2021-05-15 DIAGNOSIS — H04123 Dry eye syndrome of bilateral lacrimal glands: Secondary | ICD-10-CM | POA: Diagnosis not present

## 2021-05-28 DIAGNOSIS — I878 Other specified disorders of veins: Secondary | ICD-10-CM | POA: Diagnosis not present

## 2021-06-12 DIAGNOSIS — H16142 Punctate keratitis, left eye: Secondary | ICD-10-CM | POA: Diagnosis not present

## 2021-06-12 DIAGNOSIS — H16232 Neurotrophic keratoconjunctivitis, left eye: Secondary | ICD-10-CM | POA: Diagnosis not present

## 2021-06-12 DIAGNOSIS — H2512 Age-related nuclear cataract, left eye: Secondary | ICD-10-CM | POA: Diagnosis not present

## 2021-06-12 DIAGNOSIS — H04123 Dry eye syndrome of bilateral lacrimal glands: Secondary | ICD-10-CM | POA: Diagnosis not present

## 2021-06-30 DIAGNOSIS — H16232 Neurotrophic keratoconjunctivitis, left eye: Secondary | ICD-10-CM | POA: Diagnosis not present

## 2021-06-30 DIAGNOSIS — H04123 Dry eye syndrome of bilateral lacrimal glands: Secondary | ICD-10-CM | POA: Diagnosis not present

## 2021-06-30 DIAGNOSIS — H2512 Age-related nuclear cataract, left eye: Secondary | ICD-10-CM | POA: Diagnosis not present

## 2021-06-30 DIAGNOSIS — H16142 Punctate keratitis, left eye: Secondary | ICD-10-CM | POA: Diagnosis not present

## 2021-07-09 DIAGNOSIS — R109 Unspecified abdominal pain: Secondary | ICD-10-CM | POA: Diagnosis not present

## 2021-07-09 DIAGNOSIS — R064 Hyperventilation: Secondary | ICD-10-CM | POA: Diagnosis not present

## 2021-07-09 DIAGNOSIS — I10 Essential (primary) hypertension: Secondary | ICD-10-CM | POA: Diagnosis not present

## 2021-07-09 DIAGNOSIS — Z79899 Other long term (current) drug therapy: Secondary | ICD-10-CM | POA: Diagnosis not present

## 2021-07-09 DIAGNOSIS — Z20822 Contact with and (suspected) exposure to covid-19: Secondary | ICD-10-CM | POA: Diagnosis not present

## 2021-07-09 DIAGNOSIS — R509 Fever, unspecified: Secondary | ICD-10-CM | POA: Diagnosis not present

## 2021-07-09 DIAGNOSIS — R197 Diarrhea, unspecified: Secondary | ICD-10-CM | POA: Diagnosis not present

## 2021-07-09 DIAGNOSIS — R0689 Other abnormalities of breathing: Secondary | ICD-10-CM | POA: Diagnosis not present

## 2021-07-09 DIAGNOSIS — R112 Nausea with vomiting, unspecified: Secondary | ICD-10-CM | POA: Diagnosis not present

## 2021-07-09 DIAGNOSIS — R0902 Hypoxemia: Secondary | ICD-10-CM | POA: Diagnosis not present

## 2021-07-10 DIAGNOSIS — H16232 Neurotrophic keratoconjunctivitis, left eye: Secondary | ICD-10-CM | POA: Diagnosis not present

## 2021-07-10 DIAGNOSIS — R109 Unspecified abdominal pain: Secondary | ICD-10-CM | POA: Diagnosis not present

## 2021-07-10 DIAGNOSIS — H16142 Punctate keratitis, left eye: Secondary | ICD-10-CM | POA: Diagnosis not present

## 2021-08-05 DIAGNOSIS — I878 Other specified disorders of veins: Secondary | ICD-10-CM | POA: Diagnosis not present

## 2021-09-15 DIAGNOSIS — R0902 Hypoxemia: Secondary | ICD-10-CM | POA: Diagnosis not present

## 2021-09-15 DIAGNOSIS — Z743 Need for continuous supervision: Secondary | ICD-10-CM | POA: Diagnosis not present

## 2021-09-15 DIAGNOSIS — R6889 Other general symptoms and signs: Secondary | ICD-10-CM | POA: Diagnosis not present

## 2021-09-15 DIAGNOSIS — I10 Essential (primary) hypertension: Secondary | ICD-10-CM | POA: Diagnosis not present

## 2021-09-15 DIAGNOSIS — S51052A Open bite, left elbow, initial encounter: Secondary | ICD-10-CM | POA: Diagnosis not present

## 2021-09-15 DIAGNOSIS — S41152A Open bite of left upper arm, initial encounter: Secondary | ICD-10-CM | POA: Diagnosis not present

## 2021-09-15 DIAGNOSIS — S51852A Open bite of left forearm, initial encounter: Secondary | ICD-10-CM | POA: Diagnosis not present

## 2021-09-15 DIAGNOSIS — Z23 Encounter for immunization: Secondary | ICD-10-CM | POA: Diagnosis not present

## 2021-09-15 DIAGNOSIS — M7989 Other specified soft tissue disorders: Secondary | ICD-10-CM | POA: Diagnosis not present

## 2021-09-15 DIAGNOSIS — G43909 Migraine, unspecified, not intractable, without status migrainosus: Secondary | ICD-10-CM | POA: Diagnosis not present

## 2022-07-31 NOTE — Progress Notes (Signed)
No chief complaint on file.
